# Patient Record
Sex: Female | Born: 1975
Health system: Southern US, Community
[De-identification: ages and names within clinical notes are randomized; demographics above are authoritative.]

## PROBLEM LIST (undated history)

## (undated) ENCOUNTER — Inpatient Hospital Stay (HOSPITAL_COMMUNITY): Payer: Self-pay

## (undated) DIAGNOSIS — F419 Anxiety disorder, unspecified: Secondary | ICD-10-CM

## (undated) DIAGNOSIS — F32A Depression, unspecified: Secondary | ICD-10-CM

## (undated) DIAGNOSIS — F329 Major depressive disorder, single episode, unspecified: Secondary | ICD-10-CM

## (undated) DIAGNOSIS — R51 Headache: Secondary | ICD-10-CM

## (undated) DIAGNOSIS — O26899 Other specified pregnancy related conditions, unspecified trimester: Secondary | ICD-10-CM

## (undated) DIAGNOSIS — H8090 Unspecified otosclerosis, unspecified ear: Secondary | ICD-10-CM

## (undated) DIAGNOSIS — N200 Calculus of kidney: Secondary | ICD-10-CM

## (undated) DIAGNOSIS — R519 Headache, unspecified: Secondary | ICD-10-CM

## (undated) DIAGNOSIS — H9190 Unspecified hearing loss, unspecified ear: Secondary | ICD-10-CM

## (undated) DIAGNOSIS — N809 Endometriosis, unspecified: Secondary | ICD-10-CM

## (undated) DIAGNOSIS — N159 Renal tubulo-interstitial disease, unspecified: Secondary | ICD-10-CM

## (undated) DIAGNOSIS — G935 Compression of brain: Secondary | ICD-10-CM

## (undated) HISTORY — DX: Headache, unspecified: R51.9

## (undated) HISTORY — DX: Calculus of kidney: N20.0

## (undated) HISTORY — DX: Other specified pregnancy related conditions, unspecified trimester: O26.899

## (undated) HISTORY — DX: Headache: R51

## (undated) HISTORY — DX: Compression of brain: G93.5

## (undated) HISTORY — DX: Unspecified hearing loss, unspecified ear: H91.90

## (undated) HISTORY — DX: Renal tubulo-interstitial disease, unspecified: N15.9

## (undated) HISTORY — DX: Anxiety disorder, unspecified: F41.9

---
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
## 1997-10-23 ENCOUNTER — Other Ambulatory Visit: Admission: RE | Admit: 1997-10-23 | Discharge: 1997-10-23 | Payer: Self-pay | Admitting: Obstetrics and Gynecology

## 1997-12-13 ENCOUNTER — Ambulatory Visit (HOSPITAL_COMMUNITY): Admission: RE | Admit: 1997-12-13 | Discharge: 1997-12-13 | Payer: Self-pay | Admitting: Obstetrics and Gynecology

## 1998-02-16 HISTORY — PX: SHOULDER SURGERY: SHX246

## 1998-03-26 ENCOUNTER — Other Ambulatory Visit: Admission: RE | Admit: 1998-03-26 | Discharge: 1998-03-26 | Payer: Self-pay | Admitting: Obstetrics and Gynecology

## 1998-04-22 ENCOUNTER — Other Ambulatory Visit: Admission: RE | Admit: 1998-04-22 | Discharge: 1998-04-22 | Payer: Self-pay | Admitting: Obstetrics and Gynecology

## 1998-12-31 ENCOUNTER — Other Ambulatory Visit: Admission: RE | Admit: 1998-12-31 | Discharge: 1998-12-31 | Payer: Self-pay | Admitting: Obstetrics and Gynecology

## 1999-09-04 ENCOUNTER — Other Ambulatory Visit: Admission: RE | Admit: 1999-09-04 | Discharge: 1999-09-04 | Payer: Self-pay | Admitting: Obstetrics and Gynecology

## 2000-10-15 ENCOUNTER — Other Ambulatory Visit: Admission: RE | Admit: 2000-10-15 | Discharge: 2000-10-15 | Payer: Self-pay | Admitting: Obstetrics and Gynecology

## 2001-11-30 ENCOUNTER — Emergency Department (HOSPITAL_COMMUNITY): Admission: EM | Admit: 2001-11-30 | Discharge: 2001-12-01 | Payer: Self-pay | Admitting: Emergency Medicine

## 2002-04-04 ENCOUNTER — Other Ambulatory Visit: Admission: RE | Admit: 2002-04-04 | Discharge: 2002-04-04 | Payer: Self-pay | Admitting: Obstetrics and Gynecology

## 2003-04-13 ENCOUNTER — Other Ambulatory Visit: Admission: RE | Admit: 2003-04-13 | Discharge: 2003-04-13 | Payer: Self-pay | Admitting: Obstetrics and Gynecology

## 2004-01-24 ENCOUNTER — Ambulatory Visit (HOSPITAL_COMMUNITY): Admission: RE | Admit: 2004-01-24 | Discharge: 2004-01-24 | Payer: Self-pay | Admitting: Obstetrics and Gynecology

## 2004-03-26 ENCOUNTER — Other Ambulatory Visit: Admission: RE | Admit: 2004-03-26 | Discharge: 2004-03-26 | Payer: Self-pay | Admitting: Obstetrics and Gynecology

## 2004-08-12 ENCOUNTER — Ambulatory Visit (HOSPITAL_COMMUNITY): Admission: RE | Admit: 2004-08-12 | Discharge: 2004-08-12 | Payer: Self-pay | Admitting: Obstetrics and Gynecology

## 2004-10-20 ENCOUNTER — Inpatient Hospital Stay (HOSPITAL_COMMUNITY): Admission: AD | Admit: 2004-10-20 | Discharge: 2004-10-24 | Payer: Self-pay | Admitting: Obstetrics and Gynecology

## 2004-12-02 ENCOUNTER — Other Ambulatory Visit: Admission: RE | Admit: 2004-12-02 | Discharge: 2004-12-02 | Payer: Self-pay | Admitting: Obstetrics and Gynecology

## 2004-12-11 ENCOUNTER — Emergency Department (HOSPITAL_COMMUNITY): Admission: EM | Admit: 2004-12-11 | Discharge: 2004-12-11 | Payer: Self-pay | Admitting: Emergency Medicine

## 2006-02-16 DIAGNOSIS — H8090 Unspecified otosclerosis, unspecified ear: Secondary | ICD-10-CM

## 2006-02-16 HISTORY — DX: Unspecified otosclerosis, unspecified ear: H80.90

## 2006-02-16 HISTORY — PX: WISDOM TOOTH EXTRACTION: SHX21

## 2006-04-19 ENCOUNTER — Ambulatory Visit (HOSPITAL_COMMUNITY): Admission: RE | Admit: 2006-04-19 | Discharge: 2006-04-19 | Payer: Self-pay | Admitting: Obstetrics and Gynecology

## 2006-10-28 ENCOUNTER — Ambulatory Visit (HOSPITAL_COMMUNITY): Admission: RE | Admit: 2006-10-28 | Discharge: 2006-10-28 | Payer: Self-pay | Admitting: Obstetrics and Gynecology

## 2007-07-23 ENCOUNTER — Inpatient Hospital Stay (HOSPITAL_COMMUNITY): Admission: AD | Admit: 2007-07-23 | Discharge: 2007-07-23 | Payer: Self-pay | Admitting: Obstetrics and Gynecology

## 2007-07-24 ENCOUNTER — Inpatient Hospital Stay (HOSPITAL_COMMUNITY): Admission: AD | Admit: 2007-07-24 | Discharge: 2007-07-24 | Payer: Self-pay | Admitting: Obstetrics and Gynecology

## 2007-08-23 ENCOUNTER — Inpatient Hospital Stay (HOSPITAL_COMMUNITY): Admission: AD | Admit: 2007-08-23 | Discharge: 2007-08-25 | Payer: Self-pay | Admitting: Family Medicine

## 2008-02-17 HISTORY — PX: TONSILLECTOMY AND ADENOIDECTOMY: SHX28

## 2008-03-14 ENCOUNTER — Encounter (INDEPENDENT_AMBULATORY_CARE_PROVIDER_SITE_OTHER): Payer: Self-pay | Admitting: Obstetrics and Gynecology

## 2008-03-14 ENCOUNTER — Ambulatory Visit (HOSPITAL_COMMUNITY): Admission: RE | Admit: 2008-03-14 | Discharge: 2008-03-14 | Payer: Self-pay | Admitting: Obstetrics and Gynecology

## 2008-04-26 ENCOUNTER — Ambulatory Visit (HOSPITAL_COMMUNITY): Admission: RE | Admit: 2008-04-26 | Discharge: 2008-04-26 | Payer: Self-pay | Admitting: Obstetrics and Gynecology

## 2008-05-11 ENCOUNTER — Encounter: Admission: RE | Admit: 2008-05-11 | Discharge: 2008-05-11 | Payer: Self-pay | Admitting: Obstetrics and Gynecology

## 2008-05-17 DIAGNOSIS — Z87448 Personal history of other diseases of urinary system: Secondary | ICD-10-CM | POA: Insufficient documentation

## 2008-05-17 DIAGNOSIS — D1803 Hemangioma of intra-abdominal structures: Secondary | ICD-10-CM | POA: Insufficient documentation

## 2008-05-17 DIAGNOSIS — N946 Dysmenorrhea, unspecified: Secondary | ICD-10-CM | POA: Insufficient documentation

## 2008-05-17 DIAGNOSIS — F329 Major depressive disorder, single episode, unspecified: Secondary | ICD-10-CM

## 2008-05-17 DIAGNOSIS — N809 Endometriosis, unspecified: Secondary | ICD-10-CM | POA: Insufficient documentation

## 2008-05-17 DIAGNOSIS — N920 Excessive and frequent menstruation with regular cycle: Secondary | ICD-10-CM | POA: Insufficient documentation

## 2008-05-17 DIAGNOSIS — M129 Arthropathy, unspecified: Secondary | ICD-10-CM | POA: Insufficient documentation

## 2008-05-17 DIAGNOSIS — R51 Headache: Secondary | ICD-10-CM

## 2008-05-22 ENCOUNTER — Ambulatory Visit: Payer: Self-pay | Admitting: Internal Medicine

## 2008-05-22 DIAGNOSIS — R1032 Left lower quadrant pain: Secondary | ICD-10-CM | POA: Insufficient documentation

## 2008-05-22 DIAGNOSIS — R932 Abnormal findings on diagnostic imaging of liver and biliary tract: Secondary | ICD-10-CM | POA: Insufficient documentation

## 2008-06-06 ENCOUNTER — Encounter: Payer: Self-pay | Admitting: Internal Medicine

## 2010-04-18 ENCOUNTER — Other Ambulatory Visit: Payer: Self-pay | Admitting: Otolaryngology

## 2010-04-18 ENCOUNTER — Ambulatory Visit (HOSPITAL_BASED_OUTPATIENT_CLINIC_OR_DEPARTMENT_OTHER)
Admission: RE | Admit: 2010-04-18 | Discharge: 2010-04-18 | Disposition: A | Discharge status: Home / Self Care | Payer: Managed Care, Other (non HMO) | Source: Ambulatory Visit | Attending: Otolaryngology | Admitting: Otolaryngology

## 2010-04-18 DIAGNOSIS — J3501 Chronic tonsillitis: Secondary | ICD-10-CM | POA: Insufficient documentation

## 2010-04-18 DIAGNOSIS — Z01812 Encounter for preprocedural laboratory examination: Secondary | ICD-10-CM | POA: Insufficient documentation

## 2010-04-18 LAB — POCT HEMOGLOBIN-HEMACUE: Hemoglobin: 15.5 g/dL — ABNORMAL HIGH (ref 12.0–15.0)

## 2010-05-09 NOTE — Op Note (Signed)
  NAMENASIYAH, LAVERDIERE          ACCOUNT NO.:  [id number]  MEDICAL RECORD NO.:  [id number]           PATIENT TYPE:  LOCATION:                                 FACILITY:  PHYSICIAN:  Christopher E. Ezzard Standing, M.D. DATE OF BIRTH:  DATE OF PROCEDURE:  04/18/2010 DATE OF DISCHARGE:                              OPERATIVE REPORT   PREOPERATIVE DIAGNOSIS:  Recurrent tonsillitis.  POSTOPERATIVE DIAGNOSIS:  Recurrent tonsillitis.  OPERATION:  Tonsillectomy.  SURGEON:  Kristine Garbe. Ezzard Standing, M.D.  ANESTHESIA:  General endotracheal.  COMPLICATIONS:  None.  BRIEF CLINICAL NOTE:  Robin Petrakis is a 35 year old female who has had history of recurrent tonsil infections.  She estimates 5 or 6 infections this past year.  Because of history of recurrent tonsil infections, she is taken to the operating room at this time for tonsillectomy.  She has just recently completed a course of Z-Pak for recurrent tonsil infection.  DESCRIPTION OF PROCEDURE:  After adequate endotracheal anesthesia, mouth gag was used to expose the oropharynx.  The left and right tonsils were then resected from tonsillar fossas using cautery.  Care was taken to preserve the anterior and posterior tonsillar pillars as well as the uvula.  Hemostasis was obtained with cautery.  After obtaining adequate hemostasis, the oropharynx was irrigated with saline.  Miku was awoken from anesthesia and transferred to recovery room, postop doing well.  DISPOSITION:  Angelisse is discharged home later this morning on amoxicillin suspension 5 mg b.i.d. for 1 week, Tylenol, and Lortab Elixir 1 to 1-1/2 tablespoons q.4 h. p.r.n. pain.  She will follow up office in 10 to 14 days for recheck.          ______________________________ Kristine Garbe. Ezzard Standing, M.D.     CEN/MEDQ  D:  04/18/2010  T:  04/18/2010  Job:  [id number]  Electronically Signed by Dillard Cannon M.D. on 05/09/2010 11:09:53 AM

## 2010-06-02 LAB — CBC
HCT: 41.9 % (ref 36.0–46.0)
Hemoglobin: 14.1 g/dL (ref 12.0–15.0)
MCHC: 33.6 g/dL (ref 30.0–36.0)
MCV: 91.7 fL (ref 78.0–100.0)
Platelets: 235 10*3/uL (ref 150–400)
RBC: 4.56 MIL/uL (ref 3.87–5.11)
RDW: 12.7 % (ref 11.5–15.5)
WBC: 7.2 10*3/uL (ref 4.0–10.5)

## 2010-06-02 LAB — COMPREHENSIVE METABOLIC PANEL
ALT: 11 U/L (ref 0–35)
AST: 18 U/L (ref 0–37)
Albumin: 4.1 g/dL (ref 3.5–5.2)
Alkaline Phosphatase: 62 U/L (ref 39–117)
BUN: 9 mg/dL (ref 6–23)
CO2: 28 mEq/L (ref 19–32)
Calcium: 9 mg/dL (ref 8.4–10.5)
Chloride: 106 mEq/L (ref 96–112)
Creatinine, Ser: 0.67 mg/dL (ref 0.4–1.2)
GFR calc Af Amer: >60 mL/min (ref >60)
GFR calc non Af Amer: >60 mL/min (ref >60)
Glucose, Bld: 90 mg/dL (ref 70–99)
Potassium: 3.9 mEq/L (ref 3.5–5.1)
Sodium: 138 mEq/L (ref 135–145)
Total Bilirubin: 0.5 mg/dL (ref 0.3–1.2)
Total Protein: 6.2 g/dL (ref 6.0–8.3)

## 2010-06-02 LAB — HCG, SERUM, QUALITATIVE: Preg, Serum: NEGATIVE

## 2010-07-01 NOTE — Op Note (Signed)
NAMERITAJ, DULLEA          ACCOUNT NO.:  [id number]   MEDICAL RECORD NO.:  [id number]          PATIENT TYPE:  AMB   LOCATION:  [facility]                           FACILITY:  [facility]   PHYSICIAN:  Guy Sandifer. Henderson Cloud, M.D. DATE OF BIRTH:  1975/05/07   DATE OF PROCEDURE:  DATE OF DISCHARGE:                               OPERATIVE REPORT   PREOPERATIVE DIAGNOSIS:  Menorrhagia and dysmenorrhea.   POSTOPERATIVE DIAGNOSIS:  Menorrhagia, endometriosis, right ovarian  cyst.   PROCEDURE:  Laparoscopy with resection and ablation of endometriosis,  aspiration of right ovarian cyst, hysteroscopy, dilatation and  curettage.   SURGEON:  Guy Sandifer. Henderson Cloud, MD   ANESTHESIA:  General with endotracheal intubation.   SPECIMENS:  Endometrial curettings, biopsy of left pelvis, biopsy of  posterior cul-de-sac, and aspirate of right ovarian cyst, all to  Pathology.   ESTIMATED BLOOD LOSS:  Minimal.   I and O's of distending media 40 mL deficit.   INDICATIONS AND CONSENT:  This patient is a 35 year old married white  female, G2, P2, with known endometriosis and increasingly painful heavy  irregular menses.  Details are dictated in the history and physical.  Laparoscopy, hysteroscopy, dilatation and curettage was discussed  preoperatively.  Potential risks and complications have been discussed  preoperatively including, but not limited to infection, organ damage,  bleeding requiring transfusion of blood products with HIV and hepatitis  acquisition, DVT, PE, pneumonia, recurrent pain, laparotomy.  All  questions are answered and consent is signed on the chart.   FINDINGS:  Upper abdomen is grossly normal.  Appendix is normal.  Uterus  is 6 weeks in size, smooth in contour.  Fallopian tubes are normal  bilaterally with luxuriant fimbria.  Left ovary is normal.  Right ovary  contains 3- to 4-cm smooth translucent cyst.  Right pelvic sidewall is  normal.  Left pelvic sidewall is normal.  There is an  adhesion with  endometriosis tenting the sigmoid up on the left pelvic brim.  The  anterior cul-de-sac contains 2 light brown implants of endometriosis on  the vesicouterine peritoneum just to the right of midline.  In the  posterior cul-de-sac, there are two 8-mm implants of dark blue  endometriosis high in the center of the cul-de-sac.  In the right side  of the posterior cul-de-sac, there is a cluster of similar lesions  approximately 1.5 to 2 cm in diameter.  The endometrial canal is normal.  Both fallopian tube ostia are visualized.   PROCEDURE:  The patient was taken to the operating room where she was  identified, placed in dorsal supine position.  General anesthesia was  induced via endotracheal intubation.  She was then placed in dorsal  lithotomy position where she was prepped abdominally and vaginally,  bladder straight catheterized, and she was draped in a sterile fashion.  Bivalve speculum was placed in the vagina, and the anterior cervical lip  was injected with 0.5% Marcaine.  Paracervical block was placed at 2, 4,  5, 7, 8, and 10 o'clock positions with approximately 15 mL total of the  same solution.  Cervix was gently and progressively dilated.  Diagnostic  hysteroscope was placed in the endocervical canal and advanced under  direct visualization using distending media.  The above findings were  noted.  Hysteroscope was withdrawn.  Sharp curettage was carried out.  Single-tooth tenaculum was then replaced with a Hulka tenaculum.  Attention was turned to the abdomen.  The infraumbilical and suprapubic  areas were injected in the midline with 0.5% plain Marcaine.  A small  infraumbilical incision was made, and a disposable Veress needle was  placed on the first attempt without difficulty.  A normal syringe and  drop test were noted.  Two liters of gas were then insufflated under low  pressure with good tympany in the right upper quadrant.  Veress needle  was removed.  A  10/11 Xcel bladeless disposable trocar sleeve was then  placed using direct visualization with the diagnostic laparoscopic.  After placement, the operative laparoscope was used.  A small suprapubic  incision was made in the midline, and a 5-mm Xcel bladeless disposable  trocar sleeve was placed under direct visualization without difficulty.  The above findings were noted.  The implants in the anterior cul-de-sac  were ablated with bipolar cautery.  The other implants were briefly  ablated with bipolar cautery as well.  This was done well clear of the  course of the ureters.  The implants on the left pelvic sidewall were  then resected which also releases the adhesions of the sigmoid.  Cautery  was obtained with superficial bipolar cautery.  The implants in the  posterior cul-de-sac were also sharply resected in a superficial manner.  Hemostasis was obtained with bipolar cautery.  Copious irrigation was  carried out.  The right ovarian cyst was then aspirated for straw-  colored serous fluid which was sent for cytology as well.  Good  hemostasis was noted on the ovary.  Interceed was then back-loaded  through the laparoscope and placed over the points of cautery on the  left pelvic sidewall as well as the posterior cul-de-sac.  The remaining  9 mL of 0.5% plain Marcaine was then instilled into the peritoneal  cavity for a total of 30 mL being used for the entire case.  Suprapubic  trocar sleeve was removed, pneumoperitoneum was reduced, and the  umbilical trocar sleeve was removed.  The umbilical incision was closed  with a 0 Vicryl suture in the subcutaneous tissue with good  visualization.  Subcuticular 3-0 Vicryl was then used to close both  incisions, and Dermabond was placed on both incisions as well.  Hulka  tenaculum was removed and no bleeding was noted.  All counts were  correct.  The patient was awakened and taken to recovery room in stable  condition.      Guy Sandifer Henderson Cloud,  M.D.  Electronically Signed     JET/MEDQ  D:  03/14/2008  T:  03/14/2008  Job:  [id number]

## 2010-07-01 NOTE — H&P (Signed)
Ellen Vasquez, CLONINGER          ACCOUNT NO.:  [id number]   MEDICAL RECORD NO.:  [id number]          PATIENT TYPE:  AMB   LOCATION:  [facility]                           FACILITY:  [facility]   PHYSICIAN:  Guy Sandifer. Henderson Cloud, M.D. DATE OF BIRTH:  [birth year]   DATE OF ADMISSION:  DATE OF DISCHARGE:                              HISTORY & PHYSICAL   CHIEF COMPLAINT:  Pelvic pain and heavy menses.   HISTORY OF PRESENT ILLNESS:  This patient is a 35 year old married white  female G2, P2 with known endometriosis who has had increasingly severe  premenstrual pain.  Menses have also been quite heavy and difficult to  control with multiple hormonal medications.  Ultrasound on March 05, 2008, revealed the uterus measuring 10.5 x 3.8 x 5.3 cm.  The right  ovary contained a 2.9-cm simple cyst.  The endometrial stripe was than  measuring 0.09 cm.  After discussion of the options, she is being  admitted for laparoscopy with hysteroscopy, dilatation and curettage.  Potential risks and complications have been discussed preoperatively.   PAST MEDICAL HISTORY:  1. Depression.  2. Headaches.  3. Abnormal Pap smears.  4. Arthritis.  5. History of UTIs.  6. Endometriosis.   PAST SURGICAL HISTORY:  Laparoscopy with hysteroscopy, D&C in 1999,  laparoscopy in 2008.   OBSTETRICAL HISTORY:  Vaginal delivery x2.   FAMILY HISTORY:  Positive for asthma, UTI, osteoporosis, arthritis,  uterine cancer, and skin cancer.   MEDICATIONS:  1. Prozac 10 mg a day.  2. Percocet p.r.n. pain.   ALLERGIES:  SHELLFISH.   SOCIAL HISTORY:  Denies tobacco, alcohol, or drug abuse.   REVIEW OF SYSTEMS:  NEUROLOGIC:  Has occasional headaches and dizziness.  CARDIAC:  Denies chest pain.  PULMONARY:  Denies shortness of breath.   PHYSICAL EXAMINATION:  VITAL SIGNS:  Height 5 feet 10 inches, weight 156  pounds, and blood pressure 118/78.  LUNGS:  Clear to auscultation.  HEART:  Regular rate and rhythm.  ABDOMEN:  Soft and  nontender without masses.  PELVIC:  Bulge and cervix without lesion.  Uterus anteverted, normal  size, nontender.  Adnexa nontender without masses.  EXTREMITIES:  Grossly within normal limits.  NEUROLOGICAL:  Grossly within normal limits.   ASSESSMENT:  Dysmenorrhea and menorrhagia.   PLAN:  Laparoscopy, hysteroscopy, D&C.      Guy Sandifer Henderson Cloud, M.D.  Electronically Signed     JET/MEDQ  D:  03/12/2008  T:  03/13/2008  Job:  [id number]

## 2010-07-04 NOTE — H&P (Signed)
NAME:  Ellen Vasquez, Ellen Vasquez          ACCOUNT NO.:  [id number]   MEDICAL RECORD NO.:  [id number]          PATIENT TYPE:  AMB   LOCATION:  [facility]                           [facility]ACILITY:  [facility]   PHYSICIAN:  Guy Sandifer. Henderson Cloud, M.D. DATE OF BIRTH:  [birth year]   DATE OF ADMISSION:  04/19/2006  DATE OF DISCHARGE:                              HISTORY & PHYSICAL   CHIEF COMPLAINT:  Pelvic pain.   HISTORY OF PRESENT ILLNESS:  This patient is a 35 year old married white  female, G 1, P 1, with a known history of endometriosis.  The pain is  becoming progressively more severe.  After a discussion of the options,  she is being admitted for a laparoscopy.  The potential risks of surgery  have been reviewed with the patient preoperatively.  An ultrasound in my  office on April 16, 2006, revealed normal ovaries and a normal  uterus.   PAST MEDICAL HISTORY:  1. Depression.  2. Endometriosis.   PAST SURGICAL HISTORY:  Laparoscopy in 1999.   OBSTETRIC HISTORY:  A delivery x1.   MEDICAL DECISION MAKING:  1. Prozac daily.  2. Birth control pill daily.   ALLERGIES:  PHENERGAN AND CODEINE.   FAMILY HISTORY:  Heart disease in the paternal grandfather.  Chronic  hypertension in the mother and paternal grandfather. Diabetes in a great  uncle.  Stroke in maternal grandmother.  Skin cancer in maternal  grandmother and paternal grandmother.  Prostate cancer in a great uncle.   SOCIAL HISTORY:  Denies tobacco, alcohol or drug abuse.   REVIEW OF SYSTEMS:  NEUROLOGIC:  Denies headache.  CARDIOVASCULAR:  Denies chest pain.  PULMONARY:  Denies shortness of breath.  GI:  Denies  recent changes in bowel habits.   PHYSICAL EXAMINATION:  VITAL SIGNS:  Height 5 feet 10 inches, weight  148.8 pounds, blood pressure 118/70.  HEENT:  Without thyromegaly.  LUNGS:  Clear to auscultation.  HEART:  A regular rate and rhythm.  BACK:  Without CVA tenderness.  BREASTS:  Without masses, retraction or discharge.  ABDOMEN:   Soft, nontender, without masses.  PELVIC:  Vulva, vagina and cervix without lesions.  Uterus normal size,  mobile, nontender.  Adnexa nontender without masses.  EXTREMITIES:  Grossly within normal limits.  NEUROLOGIC:  Grossly within normal limits.   ASSESSMENT:  History of endometriosis with recurrent dysmenorrhea.   PLAN:  Laparoscopy.      Guy Sandifer Henderson Cloud, M.D.  Electronically Signed     JET/MEDQ  D:  04/16/2006  T:  04/16/2006  Job:  [id number]

## 2010-07-04 NOTE — Op Note (Signed)
NAME:  Ellen Vasquez, Ellen Vasquez          ACCOUNT NO.:  [id number]   MEDICAL RECORD NO.:  [id number]          PATIENT TYPE:  AMB   LOCATION:  [facility]                           [facility]ILITY:  [facility]   PHYSICIAN:  Guy Sandifer. Henderson Cloud, M.D. DATE OF BIRTH:  1975/06/04   DATE OF PROCEDURE:  04/19/2006  DATE OF DISCHARGE:                               OPERATIVE REPORT   PREOPERATIVE DIAGNOSIS:  Dysmenorrhea.   POSTOPERATIVE DIAGNOSIS:  Endometriosis.   PROCEDURE:  Laparoscopy with ablation of endometriosis.   SURGEON:  Harold Hedge, M.D.   ANESTHESIA:  General with endotracheal intubation.   ESTIMATED BLOOD LOSS:  Drops.   INDICATIONS AND CONSENT:  The patient is a 35 year old married white  female G1, P1 with known endometriosis.  She has worsening dysmenorrhea.  Details are dictated in the history and physical.  Laparoscopy is  discussed with the patient preoperatively.  Potential risks and  complications have been discussed preoperatively including but not  limited to infection; organ damage with bowel, bladder, ureteral damage;  bleeding requiring transfusion of blood products with possible  transfusion reaction, HIV and hepatitis acquisition; DVT; PE; pneumonia.  All questions have been answered and consent is signed on the chart.   FINDINGS:  Upper abdomen is grossly normal.  Appendix is normal.  Uterus  is normal in size and contour.  Anterior cul-de-sacs and bilateral  pelvic sidewalls were normal.  Ovaries were normal.  Fallopian tubes  were normal bilaterally with delicate fimbria.  Posterior cul-de-sac  contains 3- to 4-mm red implants of endometriosis in the center.   PROCEDURE:  The patient was taken to operating room where she was  identified, placed in dorsal supine position and general anesthesia was  induced with endotracheal intubation.  She was then placed in dorsal  lithotomy position, prepped abdominally and vaginally bladder.  Bladder  was straight catheterized, and a Hulka  tenaculum was placed in the  uterus as a manipulator.  She was then draped in a sterile fashion.  The  infraumbilical and suprapubic areas were injected in the midline with  1/2% plain Marcaine.  A small infraumbilical incision was made, and a  disposable Veress needle was placed.  Two liters of gas were insufflated  under low pressure with good tympany in the right upper quadrant.  A  Veress needle was removed, and a 10/11 XL bladeless disposable trocar  sleeve was placed under direct visualization without difficulty.  This  was then switched to the operative laparoscopic.  A small suprapubic  incision was made at the midline, and a 5-mm bladeless XL disposable  trocar sleeve was placed under direct visualization without difficulty.  The above findings were noted.  Irrigation was carried out.  The areas  in the posterior cul-de-sac of endometriosis are ablated with bipolar  cautery.  Irrigation is again carried out.  Suprapubic trocar sleeve was  removed.  No bleeding was noted.  Pneumoperitoneum was reduced, and the  umbilical trocar  sleeve was removed.  The skin of the umbilical incision was closed with  3-0 Vicryl stitches in the skin.  Both incisions were closed with  Dermabond as well.  Hulka tenaculum was removed, and good hemostasis was  noted.  All counts were correct.  The patient was awakened and taken to  recovery room in stable condition.      Guy Sandifer Henderson Cloud, M.D.  Electronically Signed     JET/MEDQ  D:  04/19/2006  T:  04/19/2006  Job:  [id number]

## 2010-11-13 LAB — CBC
HCT: 34.8 — ABNORMAL LOW
HCT: 38.8
Hemoglobin: 11.9 — ABNORMAL LOW
Hemoglobin: 13.4
MCHC: 34.2
MCHC: 34.5
MCV: 91.8
MCV: 92.7
Platelets: 174
Platelets: 200
RBC: 3.75 — ABNORMAL LOW
RBC: 4.23
RDW: 13.1
RDW: 13.1
WBC: 13.7 — ABNORMAL HIGH
WBC: 14.9 — ABNORMAL HIGH

## 2010-11-13 LAB — URINALYSIS, ROUTINE W REFLEX MICROSCOPIC
Bilirubin Urine: NEGATIVE
Glucose, UA: NEGATIVE
Hgb urine dipstick: NEGATIVE
Ketones, ur: NEGATIVE
Nitrite: NEGATIVE
Protein, ur: NEGATIVE
Specific Gravity, Urine: <1.005 — ABNORMAL LOW
Urobilinogen, UA: 0.2
pH: 6.5

## 2010-11-13 LAB — RPR: RPR Ser Ql: NONREACTIVE

## 2011-01-01 ENCOUNTER — Other Ambulatory Visit (HOSPITAL_COMMUNITY): Payer: Self-pay | Admitting: Obstetrics and Gynecology

## 2011-01-01 DIAGNOSIS — N979 Female infertility, unspecified: Secondary | ICD-10-CM

## 2011-01-05 ENCOUNTER — Ambulatory Visit (HOSPITAL_COMMUNITY)
Admission: RE | Admit: 2011-01-05 | Discharge: 2011-01-05 | Disposition: A | Discharge status: Home / Self Care | Payer: Managed Care, Other (non HMO) | Source: Ambulatory Visit | Attending: Obstetrics and Gynecology | Admitting: Obstetrics and Gynecology

## 2011-01-05 DIAGNOSIS — N979 Female infertility, unspecified: Secondary | ICD-10-CM

## 2011-01-05 MED ORDER — IOHEXOL 300 MG/ML  SOLN: 4.0000 mL | Freq: Once | INTRAMUSCULAR | Status: AC | PRN

## 2012-11-30 ENCOUNTER — Encounter: Payer: Self-pay | Admitting: Podiatry

## 2012-11-30 ENCOUNTER — Ambulatory Visit (INDEPENDENT_AMBULATORY_CARE_PROVIDER_SITE_OTHER): Payer: BC Managed Care – PPO | Admitting: Podiatry

## 2012-11-30 VITALS — BP 135/98 | HR 87 | Resp 12

## 2012-11-30 DIAGNOSIS — M779 Enthesopathy, unspecified: Secondary | ICD-10-CM

## 2012-11-30 DIAGNOSIS — M216X9 Other acquired deformities of unspecified foot: Secondary | ICD-10-CM

## 2012-11-30 NOTE — Patient Instructions (Signed)
We will call when orthotics are returned

## 2012-11-30 NOTE — Progress Notes (Signed)
Subjective:     Patient ID: Ellen Vasquez, female   DOB: 01/10/1976, 37 y.o.   MRN: [id number]  Foot Pain   patient states that she is still having pain but is improving. She is able to walk but feels like she is still walking on a pebble   Review of Systems  All other systems reviewed and are negative.       Objective:   Physical Exam  Constitutional: She appears well-developed.  Cardiovascular: Intact distal pulses.   Musculoskeletal: Normal range of motion.  Neurological: She is alert.  Skin: Skin is warm.   Patient continues to experience plantar pain in the fourth metatarsophalangeal joint bilateral. She did get 2 days of relief from the previous injection last week    Assessment:     Capsulitis fourth MPJ bilateral. Most likely structural in orientation    Plan:    recommended orthotics scanning which was done today to try to take stress off the joints. Discussed that ultimately this may be surgery to shorten the metatarsal

## 2013-01-04 ENCOUNTER — Encounter: Payer: Self-pay | Admitting: *Deleted

## 2013-01-04 ENCOUNTER — Encounter: Payer: BC Managed Care – PPO | Attending: Family Medicine | Admitting: *Deleted

## 2013-01-04 DIAGNOSIS — R638 Other symptoms and signs concerning food and fluid intake: Secondary | ICD-10-CM | POA: Insufficient documentation

## 2013-01-04 DIAGNOSIS — E162 Hypoglycemia, unspecified: Secondary | ICD-10-CM | POA: Insufficient documentation

## 2013-01-04 DIAGNOSIS — R635 Abnormal weight gain: Secondary | ICD-10-CM | POA: Insufficient documentation

## 2013-01-04 NOTE — Patient Instructions (Addendum)
Make appointment with Dr. Elvera Lennox [phone].  [address]  [address] Monitor blood sugar when feeling low.  Record value IF glucose if less than 70, that is true hypoglycemia: Treat with 15g carbohydrate = 4 oz juice, 4 tea, 8 oz skim; 4 glucose tablets or 4 peppermints. Try not to use foods that have fat or protein. Wait 15 minute to give glucose time to raise back up and then have something with protein and carbs.   Aim for protein and fiber with all eating occasions For snacks choose greek yogurt OR fruit and nuts or nut butter or cheese Keep glucose tabs with you at all times.

## 2013-01-04 NOTE — Progress Notes (Signed)
Medical Nutrition Therapy:  Appt start time: 1030 end time:  1130.  Assessment:  Primary concerns today: Ellen Vasquez is here for nutrition counseling pertaining to hypoglycemia.  She had recent blood work done and that revealed hypoglycemia.  [name] states that this is not a new issue and she has been to endocrinologists multiple time.  She states that she hasn't been able to identify the issue. She has been dealing with hypoglycemia since childhood, but it has gotten worse.  She doesn't check her glucose at home, but feels dizziness, shakiness, at least once a day.    Gained 20 pounds this year for unknown reasons.  She exercises regularly.  She has 2 children.  She doesn't drink soda or fast foods.  She isn't a big meat eater.  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  She struggles with theses hypoglycemia symptoms and feels like she has to eat a great deal to get her glucose back up.  She uses protein bars for treatment.  She also uses peanut butter, bananas, yogurt.  She feels like her most trouble is between 10-2.  She eats Special K protein or oatmeal with protein and eats larger portions.  She has to eat again greek yogurt and banana about 2 hours late.  She eats again around noon: carbs, veggies (salad), protein, preferably vegetable-based protein, but will eat children.  She doesn't like fish and uses ground [place] instead of beef.  Very rarely eats beef.  Her iron levels are normal.  She plans to start fertility treatment soon. The last time she has been to an endocrinologist 15 years ago.  She cooks heart-healthy at home because husband has high cholesterol.  She cooks with [employer] products.  She does look at fiber in her food products. Exercise affects her blood sugar in the mornings only.  If she exercises at night, her glucose is fine  Preferred Learning Style: Auditory  Learning Readiness: Ready   MEDICATIONS: see list   DIETARY INTAKE: Usual eating pattern includes  3 meals and 2 snacks per day.  Everyday foods include vegetables, lean proteins, complex carbs (whole grains) .  Avoided foods include pork, fried foods, beef, fish, shellfish, potatoes in moderation, candy, junk foods, limits sweets and desserts.    24-hr recall:  B ( AM): high protein cereal with 1% milk.  Sometimes OJ and hot tea with sugar and milk  Snk ( AM): greek yogurt and banana or apple  L ( PM): salad or [place] sandwich.  Sometimes pasta Snk ( PM): fruit; oatmeal to go; yogurt covered raisins or peanuts.  Wheat thins.  Maybe greek yogurt D ( PM): meat, starch, vegetables- baked breaded eggplant with corn/peas and broccoli and a bean and egg noodles Snk ( PM): usually not.  Maybe cup of tea.  Sometimes wheat thins when taking medication Beverages: hot tea, OJ, water, iced tea.  No soda  Usual physical activity: 1 hour 4-6 day/week.  Cardio mostly.  Not much weight training  Estimated energy needs: 1800 calories 200 g carbohydrates 135 g protein 50 g fat    Nutritional Diagnosis:  [place]-2.1 Inpaired nutrition utilization As related to carbohydrates.  As evidenced by frequent episodes of hypoglycemia.    Intervention:  Nutrition counseling provided.  Referred to Dr. Elvera Lennox for insulin check and/or pancreatic tumor.  Possible benefit for Diazoxide prescription.  Sylvi felt like she was getting hypoglycemic today in session.  We checked her blood glucose and it was 91mg /dL  Recommendations:  Monitor blood sugar when feeling low.  Record value IF glucose if less than 70, that is true hypoglycemia: Treat with 15g carbohydrate = 4 oz juice, 4 tea, 8 oz skim; 4 glucose tablets or 4 peppermints. Try not to use foods that have fat or protein. Wait 15 minute to give glucose time to raise back up and then have something with protein and carbs.   Aim for protein and fiber with all eating occasions For snacks choose greek yogurt OR fruit and nuts or nut butter or cheese Keep glucose  tabs with you at all times.  Teaching Method Utilized: Auditory  Blood glucose monitoring kit given: [employer] Next Lot # [id number] Exp: 4/15 Glucose reading :91 mg/dL  Barriers to learning/adherence to lifestyle change: none  Demonstrated degree of understanding via:  Teach Back   Monitoring/Evaluation:  Dietary intake, exercise, lab results, and body weight prn.

## 2013-01-09 ENCOUNTER — Other Ambulatory Visit (HOSPITAL_COMMUNITY): Payer: Self-pay | Admitting: Obstetrics and Gynecology

## 2013-01-09 DIAGNOSIS — Z3141 Encounter for fertility testing: Secondary | ICD-10-CM

## 2013-01-11 ENCOUNTER — Ambulatory Visit (INDEPENDENT_AMBULATORY_CARE_PROVIDER_SITE_OTHER): Payer: BC Managed Care – PPO | Admitting: Podiatry

## 2013-01-11 ENCOUNTER — Encounter: Payer: Self-pay | Admitting: Podiatry

## 2013-01-11 VITALS — BP 113/72 | HR 92 | Resp 12

## 2013-01-11 DIAGNOSIS — M216X9 Other acquired deformities of unspecified foot: Secondary | ICD-10-CM

## 2013-01-11 DIAGNOSIS — M779 Enthesopathy, unspecified: Secondary | ICD-10-CM

## 2013-01-11 NOTE — Patient Instructions (Signed)

## 2013-01-11 NOTE — Progress Notes (Signed)
Subjective:     Patient ID: Ellen Vasquez, female   DOB: 10-30-1975, 37 y.o.   MRN: [id number]  HPI patient states that I am having continued discomfort in these joints. It is most pronounced after periods of activity or when on my feet a lot   Review of Systems     Objective:   Physical Exam Neurovascular status intact with continued discomfort in the fourth MPJ is of both feet    Assessment:     Chronic capsulitis fourth MPJ bilateral that has failed so far to respond to conservative care    Plan:     Today orthotics were dispensed with instructions on usage. I discussed that if symptoms were to continue working in need to consider shortening osteotomies at one point in the future. Reappoint in 6 weeks

## 2013-01-18 ENCOUNTER — Ambulatory Visit (HOSPITAL_COMMUNITY)
Admission: RE | Admit: 2013-01-18 | Discharge: 2013-01-18 | Disposition: A | Discharge status: Home / Self Care | Payer: BC Managed Care – PPO | Source: Ambulatory Visit | Attending: Obstetrics and Gynecology | Admitting: Obstetrics and Gynecology

## 2013-01-18 DIAGNOSIS — Z3141 Encounter for fertility testing: Secondary | ICD-10-CM

## 2013-01-18 DIAGNOSIS — N979 Female infertility, unspecified: Secondary | ICD-10-CM | POA: Insufficient documentation

## 2013-01-18 MED ORDER — IOHEXOL 300 MG/ML  SOLN
20.0000 mL | Freq: Once | INTRAMUSCULAR | Status: AC | PRN
  Administered 2013-01-18: 20 mL

## 2013-02-10 ENCOUNTER — Encounter: Payer: Self-pay | Admitting: Internal Medicine

## 2013-02-10 ENCOUNTER — Ambulatory Visit (INDEPENDENT_AMBULATORY_CARE_PROVIDER_SITE_OTHER): Payer: BC Managed Care – PPO | Admitting: Internal Medicine

## 2013-02-10 VITALS — BP 118/84 | HR 70 | Temp 98.0°F | Ht 70.0 in | Wt 162.0 lb

## 2013-02-10 DIAGNOSIS — E162 Hypoglycemia, unspecified: Secondary | ICD-10-CM

## 2013-02-10 LAB — HEMOGLOBIN A1C: Hgb A1c MFr Bld: 5.1 % (ref 4.6–6.5)

## 2013-02-10 NOTE — Patient Instructions (Addendum)
Try to follow a low glycemic index diet, mostly mediterranean. Try Ezekiel Bread.  [email or web address] Please continue to see Ellen Vasquez for further dietary adjustment.  Please go to the nearest lab (have somebody drive) if sugars are 50 or below. Take the lab orders with you.

## 2013-02-10 NOTE — Progress Notes (Signed)
Subjective:     Patient ID: Ellen Vasquez, female   DOB: 1976-01-04, 37 y.o.   MRN: [id number]  HPI Ellen Vasquez is a pleasant 37 y.o. woman, referred by her nutritionist, Denny Levy, for evaluation for hypoglycemia.  She has had hypoglycemic sxs ever since she was a teenager: - tremors - palpitations - sensation of passing out Lowest CBG reading: 67 - she started to check sugars recently, after getting a meter from nutritionist. No fasting hypoglycemia but starts to feel hypoglycemic 1.5-2.5 hours after she eats. She tries to eat many times a day to keep up with this.   She gained weight 25 lbs in last year, despite good diet and exercising regularly. She was on Nortriptyline >> stopped 1 mo ago >> no weight loss.  Meals are mostly healthy, she does not overeat, and a follow a DASH diet at home (husband with hyperlipidemia). Her meals are: - Breakfast: Special K. Protein serial, hot tea - at 7:30 AM - Snacks: Yogurt or fruit - at 10 AM - lunch: Sandwhich, soup/salads, pasta with veggies - dinner: veggies, starch, some type of protein - other status: To before a day: Yogurt, fruit, nuts, cheese, crackers  She exercises by cardio 4-5 times a week.  FH of morbid obesity in mother. No DM FH.   Review of Systems Constitutional: + weight gain, + fatigue, +subjective hyperthermia (hot flashes) Eyes: no blurry vision, no xerophthalmia ENT: no sore throat, no nodules palpated in throat, no dysphagia/odynophagia, no hoarseness, + hypoacusis, + tinnitus Cardiovascular: no CP/SOB/palpitations/+ leg swelling Respiratory: no cough/SOB Gastrointestinal: no N/V/D/C Musculoskeletal:+ both: muscle/joint aches Skin: + rash, + excessive hair growth Neurological: + tremors - when feels that sugars drop/no numbness/tingling/dizziness, + headache Psychiatric: + depression/+ anxiety Regular menstrual cycles    Objective:   Physical Exam BP 118/84  Pulse 70  Temp(Src) 98 F (36.7 C)  (Oral)  Ht 5\' 10"  (1.778 m)  Wt 162 lb (73.483 kg)  BMI 23.24 kg/m2  SpO2 99%  LMP 01/08/2013 Wt Readings from Last 3 Encounters:  02/10/13 162 lb (73.483 kg)  05/22/08 149 lb 8 oz (67.813 kg)   Constitutional: normal weight, in NAD Eyes: PERRLA, EOMI, no exophthalmos ENT: moist mucous membranes, no thyromegaly, no cervical lymphadenopathy Cardiovascular: RRR, No MRG Respiratory: CTA B Gastrointestinal: abdomen soft, NT, ND, BS+ Musculoskeletal: no deformities, strength intact in all 4 Skin: moist, warm, no rashes Neurological: no tremor with outstretched hands, DTR normal in all 4  PMH: - Kidney stones - anemia - migraines - endometriosis - otosclerosis  Past Surgical History  Procedure Laterality Date  . Abdominal hysterectomy    . Wisdom tooth extraction    . Shoulder surgery     History   Social History  . Marital Status: Married    Spouse Name: N/A    Number of Children: 2: 8 and 5 y/o   Occupational History  . Professor   Social History Main Topics  . Smoking status: Never Smoker   . Smokeless tobacco: No  . Alcohol Use: Wine, 2x a week  . Drug Use: No   Current Outpatient Prescriptions on File Prior to Visit  Medication Sig Dispense Refill  . Azelaic Acid (FINACEA) 15 % cream Apply topically 2 (two) times daily. After skin is thoroughly washed and patted dry, gently but thoroughly massage a thin film of azelaic acid cream into the affected area twice daily, in the morning and evening.      . Prenatal Vit-Fe Fumarate-FA (PRENATAL  MULTIVITAMIN) TABS tablet Take 1 tablet by mouth daily at 12 noon.      . nortriptyline (PAMELOR) 10 MG capsule Take 10 mg by mouth at bedtime.       No current facility-administered medications on file prior to visit.   Allergies  Allergen Reactions  . Shellfish Allergy    Family History  Problem Relation Age of Onset  . Asthma Other   . Cancer Other   . Hypertension Other   . Hyperlipidemia Other   . Stroke Other    . Obesity Other     Assessment:     Mild reactive hypoglycemia - without actual low CBGs    Plan:     Patient with apparently long history of reactive (postprandial) very mild hypoglycemia, with CBG levels out of proportion compared with her symptoms. She has a sensation that she would pass out, and also has anxiety, tremors, occasional headaches if she doesn't eat frequently. She had similar symptoms in the office today. CBG 104.  - I had a long discussion with the patient about what reactive hypoglycemia means, and the fact that in patients without diabetes, the CBG threshold of 70 does not apply. Therefore, a sugar in the 60s is perfectly normal, and it can be even lower than that especially in young women and athletes. We discussed about what the notion of "sugar crash" means and that it can be a normal reaction to a high glycemic index food. In pts with insulin resistance, for example prediabetic pts, there can be a mismatch between the sugar increase after a meal and pancreatic insulin production. Insulin is secreted in the circulation to late and in too large quantities >> hypoglycemia. I believe she has something similar, but not clear hypoglycemia as there are no clearly low sugars. - we discussed at length about diet (including examples) and the importance of avoiding high Glycemic Index foods - given web link for reference. I advised her to continue to work with Vernona Rieger to improve her diet to avoid reactive hypoglycemia - I also advised her to go to the nearest lab (have somebody drive her) if sugars are 50 or below and I gave her lab orders to be done if her sugars are low: Orders Placed This Encounter  Procedures  . C-peptide  . Basic metabolic panel  . Insulin, random  . Proinsulin  . Beta-hydroxybutyric acid  - we checked a Hba1c today. - will schedule a return appt only if the below labs are abnormal  Office Visit on 02/10/2013  Component Date Value Range Status  .  Hemoglobin A1C 02/10/2013 5.1  4.6 - 6.5 % Final   Glycemic Control Guidelines for People with Diabetes:Non Diabetic:  <6%Goal of Therapy: <7%Additional Action Suggested:  >8%

## 2013-02-22 ENCOUNTER — Ambulatory Visit: Payer: BC Managed Care – PPO | Admitting: Podiatry

## 2013-03-08 ENCOUNTER — Ambulatory Visit (INDEPENDENT_AMBULATORY_CARE_PROVIDER_SITE_OTHER): Payer: BC Managed Care – PPO | Admitting: Podiatry

## 2013-03-08 ENCOUNTER — Encounter: Payer: Self-pay | Admitting: Podiatry

## 2013-03-08 VITALS — BP 110/71 | HR 74 | Resp 16

## 2013-03-08 DIAGNOSIS — M216X9 Other acquired deformities of unspecified foot: Secondary | ICD-10-CM

## 2013-03-08 DIAGNOSIS — M779 Enthesopathy, unspecified: Secondary | ICD-10-CM

## 2013-03-08 NOTE — Progress Notes (Signed)
Subjective:     Patient ID: Ellen Vasquez, female   DOB: [birth year], 38 y.o.   MRN: [id number]  HPI patient presents stating that she still is getting the pain. States the orthotics have helped some but it does cause some pain around the first metatarsal but she forgot to bring them today. States that she cannot exercise with this and admits that she is not making the progress she was hoping   Review of Systems     Objective:   Physical Exam Neurovascular status intact with intense discomfort which continues to present in the fourth metatarsal allergy we'll joint of both feet with what appears to be diminished motion of this joint and inflammation present    Assessment:     Pathology of the fourth MPJ bilateral which has failed to respond to previous injection shoe gear modifications padding and orthotics    Plan:     Reviewed all findings with her and we did dispense further padding for her to wear her today. We discussed ultimate shortening osteotomy surgery the fourth metatarsals and she wants to do this but she is undergoing infertility treatments so at this time we are going to hold off until we know the status of that. I will see her prior to surgery

## 2013-06-16 HISTORY — PX: LAPAROSCOPIC ABDOMINAL EXPLORATION: SHX6249

## 2013-06-16 HISTORY — PX: DILATION AND CURETTAGE OF UTERUS: SHX78

## 2013-06-19 ENCOUNTER — Other Ambulatory Visit: Payer: Self-pay | Admitting: Obstetrics and Gynecology

## 2013-07-26 ENCOUNTER — Ambulatory Visit (INDEPENDENT_AMBULATORY_CARE_PROVIDER_SITE_OTHER): Payer: BC Managed Care – PPO | Admitting: Surgery

## 2013-07-26 ENCOUNTER — Telehealth (INDEPENDENT_AMBULATORY_CARE_PROVIDER_SITE_OTHER): Payer: Self-pay | Admitting: *Deleted

## 2013-07-26 ENCOUNTER — Encounter (INDEPENDENT_AMBULATORY_CARE_PROVIDER_SITE_OTHER): Payer: Self-pay | Admitting: Surgery

## 2013-07-26 VITALS — BP 122/74 | HR 72 | Temp 97.5°F | Ht 68.0 in | Wt 154.0 lb

## 2013-07-26 DIAGNOSIS — D7389 Other diseases of spleen: Secondary | ICD-10-CM

## 2013-07-26 DIAGNOSIS — D1803 Hemangioma of intra-abdominal structures: Secondary | ICD-10-CM

## 2013-07-26 DIAGNOSIS — D734 Cyst of spleen: Secondary | ICD-10-CM | POA: Insufficient documentation

## 2013-07-26 NOTE — Progress Notes (Signed)
General Surgery [facility]  Chief Complaint  Patient presents with  . New Evaluation    evaluate cyst in LUQ abdomen seen at laparoscopy - referral from Dr. Everlene Farrier    HISTORY: Patient is a pleasant 38 year old female who presents today accompanied by her husband for evaluation of a cystic mass identified at the time of laparoscopy on 06/19/2013. Apparently the patient was undergoing laparoscopy for ablation of endometriosis. A cystic lesion was noted in the left upper quadrant of the abdomen. Photographs were taken but are not available today for evaluation. Patient was referred to general surgery for further assessment and recommendations.  Patient does have old studies dating from 2006 and 2010 including CT scans of the abdomen and pelvis and an MRI scan of the abdomen. At that time a 14 mm hemangioma was noted in the right lobe of the liver. Small cyst were noted in the liver. Small cysts were noted bilaterally in the kidneys. No abnormality of the spleen was identified.  Patient denies any history of trauma. She has had no other abdominal surgery with the exception of laparoscopy. She does have intermittent left upper quadrant sharp pain which is brief in nature. She has had a history of kidney stones.  History reviewed. No pertinent past medical history.  No current outpatient prescriptions on file.   No current facility-administered medications for this visit.    Allergies  Allergen Reactions  . Shellfish Allergy     Family History  Problem Relation Age of Onset  . Asthma Other   . Cancer Other   . Hypertension Other   . Hyperlipidemia Other   . Stroke Other   . Obesity Other     History   Social History  . Marital Status: Married    Spouse Name: N/A    Number of Children: N/A  . Years of Education: N/A   Social History Main Topics  . Smoking status: Former Smoker    Types: Cigarettes    Quit date: 11/25/2001  . Smokeless tobacco:  None  . Alcohol Use: Yes  . Drug Use: No  . Sexual Activity: None   Other Topics Concern  . None   Social History Narrative  . None    REVIEW OF SYSTEMS - PERTINENT POSITIVES ONLY: Intermittent left upper quadrant pain presumed related to nephrolithiasis. No other abdominal complaints.  EXAM: Filed Vitals:   07/26/13 0905  BP: 122/74  Pulse: 72  Temp: 97.5 F (36.4 C)    GENERAL: well-developed, well-nourished, no acute distress HEENT: normocephalic; pupils equal and reactive; sclerae clear; dentition good; mucous membranes moist NECK:  No palpable masses in the thyroid bed; symmetric on extension; no palpable anterior or posterior cervical lymphadenopathy; no supraclavicular masses; no tenderness CHEST: clear to auscultation bilaterally without rales, rhonchi, or wheezes CARDIAC: regular rate and rhythm without significant murmur; peripheral pulses are full ABDOMEN: soft without distension; bowel sounds present; no hernia; healing surgical wounds at the umbilicus and suprapubic region; palpation shows no hepatosplenomegaly and no palpable masses in the upper abdomen EXT:  non-tender without edema; no deformity NEURO: no gross focal deficits; no sign of tremor   LABORATORY RESULTS: See [facility] ([facility]L-Epic) for most recent results  RADIOLOGY RESULTS: See [facility] (CHL-Epic) for most recent results  IMPRESSION: #1 cyst identified in the upper abdomen, presumably arising from the spleen #2 history of hemangioma right lobe of liver, 14 mm #3 history of endometriosis  PLAN: I discussed the above findings at  length with the patient and her husband. We reviewed her MRI scan and CT scans from 2010.  We will request the photographs taken at the time of surgery on 06/19/2013 from the office of Dr. Everlene Farrier.  We will order an abdominal ultrasound to evaluate for possible splenic cyst.  I will contact the patient once I have reviewed the photographs and have  the results of her abdominal ultrasound.  Earnstine Regal, MD, [facility]  Primary Care Physician: Milagros Evener, MD

## 2013-07-26 NOTE — Telephone Encounter (Signed)
LM for pt to return my call regarding her [place] that Dr. Harlow Asa wants her to have.  Please advise pt is is scheduled for 08-01-13, [place], [place] [address] Arrive at 8:15 a.m..  Advise pt NPO after midnight the night before.  Thanks!  Anderson Malta

## 2013-08-01 ENCOUNTER — Ambulatory Visit
Admission: RE | Admit: 2013-08-01 | Discharge: 2013-08-01 | Disposition: A | Discharge status: Home / Self Care | Payer: BC Managed Care – PPO | Source: Ambulatory Visit | Attending: Surgery | Admitting: Surgery

## 2013-08-01 DIAGNOSIS — D734 Cyst of spleen: Secondary | ICD-10-CM

## 2013-08-01 DIAGNOSIS — D1803 Hemangioma of intra-abdominal structures: Secondary | ICD-10-CM

## 2013-08-01 NOTE — Progress Notes (Signed)
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 Hemangioma is noted in liver. Small 2 cm cyst in left kidney.  No pathologic findings.  I called these results to the patient. No further evaluation or treatment necessary.  Will see for surgical care as needed.  Earnstine Regal, MD, [facility] Office: [phone]   ______

## 2014-01-18 ENCOUNTER — Ambulatory Visit (HOSPITAL_COMMUNITY): Payer: BC Managed Care – PPO

## 2014-01-19 ENCOUNTER — Ambulatory Visit (HOSPITAL_COMMUNITY)
Admission: RE | Admit: 2014-01-19 | Discharge: 2014-01-19 | Disposition: A | Discharge status: Home / Self Care | Payer: BC Managed Care – PPO | Source: Ambulatory Visit | Attending: Obstetrics and Gynecology | Admitting: Obstetrics and Gynecology

## 2014-01-23 NOTE — Progress Notes (Signed)
Genetic Counseling  High-Risk Gestation Note  Appointment Date:  01/19/2014 Referred By: Governor Specking, MD Date of Birth:  11-07-1975 Partner: Ellen Vasquez   Pregnancy History: G3P2 Estimated Date of Delivery: 09/06/14 Estimated Gestational Age: 45w1dAttending: MRenella Cunas MD   I met with [name] her husband, Mr. Ellen Vasquez for genetic counseling given that expanded pan-ethnic carrier screening through her REI provider identified Mrs. Ellen Vasquez as a carrier for Autosomal Recessive Polycystic Kidney Disease.   [name] expanded pan-ethnic carrier screening ([facility] facilitated through her [facility] office. The results of the screen were positive for carrier status for Autosomal Recessive Polycystic Kidney Disease (ARPKD). Specifically, the name of the PKHD1 gene mutation she carries is c.107C>T. We discussed that her carrier screening was negative for the additional mutations/26 conditions screened. Thus, her risk to be a carrier for these additional conditions (listed separately in the laboratory report) has been reduced but not eliminated. The prevalence of each condition varies (and often varies with ethnicity). Thus the patient's background risk to be a carrier for each of these various conditions would range, and in some cases be very low or unknown. Similarly, the detection rate varies with each condition and also varies in some cases with ethnicity, ranging from greater than 99% to unknown. We reviewed that a negative carrier screen would thus reduce but not eliminate the chance to be a carrier for these conditions.   Carrier screening was also performed for [name] Dr. YCharlett Langooffice, and results are currently pending. [name] no additional relatives known to be ARPKD carriers and no known relatives with a diagnosis or features of autosomal recessive polycystic kidney disease.  Mr. PBattermanreported no known individuals with ARPKD in his family history, and consanguinity to Mrs. Ellen GERDTSwas denied. He reported Italian/[place] ancestry. The couple have two healthy daughters, ages 965years and 67years old.   Autosomal recessive polycystic kidney disease is a form of congenital hepatorenal fibrocystic disease. The majority of affected individuals classically present in the neonatal period with enlarged echogenic kidneys. Greater than 50% of affected individuals with classic ARPKD progress to end-stage renal disease, typically in the first decade of life. Renal disease is characterized by nephromegaly, hypertension, and varying degrees of renal dysfunction. ARPKD can also present on prenatal ultrasound, with massively enlarged echogenic kidneys and oligohydramnios. Approximately 20-30% of infants with oligohydramnios and subsequent pulmonary hypoplasia die in the neonatal period or within the first year of life from respiratory insufficiency.  Hepatobiliary disease is characterized by hepatomegaly, splenomegaly, risk of ascending cholangitis and progressive portal hypertension.  The two organ systems primarily affected are kidney and liver, but secondary effects are seen in other organ systems. Though most individuals present with classic neonatal presentation, there is variability in age and presenting symptoms related to relative degree of renal and biliary abnormalities. A minority of affected individuals present as older children or young adults with evidence of hepatic dysfunction as the prominent presenting feature.   We reviewed autosomal recessive inheritance of ARPKD. We reviewed that each pregnancy for a carrier couple of an autosomal recessive condition has a 1 in 4 (25%) chance to inherit the condition. Each pregnancy together also has an independent 1 in 2 (50%) chance to be a carrier like the parents and a 1 in 4 (25%) chance to be neither a carrier nor  affected. We reviewed that in autosomal recessive conditions, if one parent is a carrier  and the other is not, the pregnancy would have a 1 in 2 (50%) chance to be a carrier. The diagnosis of ARPKD is typically determined by clinical findings in the proband and the absence of renal disease in the proband's biological parents (to distinguish between Autosomal Dominant Polycystic Kidney Disease). We discussed that mutations in the PKHD1 gene has been found to be causative for ARPKD. Clinical molecular analysis for PKHD1 detects mutations in approximately 85% of individuals with a clinical diagnosis of ARPKD. We reviewed that individuals who are carriers for autosomal recessive conditions, including ARPKD do not typically display clinical features.   The carrier frequency for mutations in PKHD1 is reported to be approximately 1 in 100 in the Caucasian population. Thus, prior to carrier screening for Ellen Vasquez, the risk for ARPKD in the current pregnancy is approximately 1 in 400. Carrier screening for the common mutations PKHD1 performed through [facility] (the laboratory that performed the carrier screen for Mrs. Termine) detects approximately 45% of carriers in the Caucasian population. Thus, a negative screen would reduce, but not eliminate the risk to be a carrier. He understands that pursuing carrier screening for a panel of conditions would also assess for additional conditions for which Ellen Vasquez previously screened negative. Additionally, we discussed that gene sequencing could be performed for PKHD1, which would have a higher detection rate, but also typically a higher cost. It is not known which lab Ellen Vasquez carrier screen was sent to at this time. However, we discussed that it was likely sent to the same laboratory that performed [name]'s carrier screen. We discussed that in the case that both individuals are determined to be carriers for ARPKD, prenatal diagnosis would be  available via chorionic villus sampling (CVS) or amniocentesis. The risks, benefits, and limitations of CVS and amniocentesis were reviewed. Ellen Vasquez stated that she is not interested in invasive, diagnostic testing (CVS or amniocentesis) in the current pregnancy and would not be interested in these even if Ellen Vasquez were also found to be a carrier for ARPKD.   Both family histories were reviewed and were otherwise contributory for otosclerosis. The patient reported that she has this and that her father possibly has features of hearing loss but does not currently seek follow-up. Otosclerosis is characterized by isolated endochondral bone sclerosis of the labyrinth capsule. Approximately 90% of affected individuals are under age 20 years at the time of diagnosis, and mean age of onset is in the third decade. Genetic heterogeneity exists for otosclerosis, meaning various genes have been identified to be causative of otosclerosis. Autosomal dominant inheritance with reduced penetrance is described. In autosomal dominant inheritance, each offspring of an individual with a condition has a 1 in 2 (50%) chance to inherit the condition. Reduced penetrance describes where an individual may inherit the gene that predisposes to the condition but not display features of the condition. Penetrance has been estimated to be less than 50%. Thus, the risk for offspring of an affected individual would be estimated to be approximately 25% (accounting to autosomal dominant inheritance and reduced penetrance).   Ellen Vasquez also reported that she has been followed by neurology in the past, given a history of migraines and was found to have Chiari malformation, incidentally during a work-up regarding her migraines. We discussed that a Chiari malformation (CM) is a structural defect in the cerebellum. When the indented bony space at the lower rear of the skull is smaller than normal, the cerebellum and brain stem can be  pushed  downward. The resulting pressure on the cerebellum can block the flow of cerebrospinal fluid causing symptoms. While a specific etiology has not been determined in most families, this can occur as an isolated finding or be part of an underlying disorder. There have been reports in the medical literature of families in which more than one family member was affected by a Chiari malformation. This suggests that in some cases genetic factors play a role in the development of a Chiari malformation. Research is ongoing to determine the specific genetic components that influence the development of Chiari malformations in some people. However, at this time, specific recurrence information or genetic testing is not available. Without further information regarding the provided family history, an accurate genetic risk cannot be calculated. Further genetic counseling is warranted if more information is obtained.  They were counseled regarding maternal age and the association with risk for chromosome conditions due to nondisjunction with aging of the ova.   We reviewed chromosomes, nondisjunction, and the associated 1 in 31 risk for fetal aneuploidy related to a maternal age of 38 y.o. at the current gestation.  They were counseled that the risk for aneuploidy decreases as gestational age increases, accounting for those pregnancies which spontaneously abort.  We specifically discussed Down syndrome (trisomy 92), trisomies 46 and 60, and sex chromosome aneuploidies (47,XXX and 47,XXY) including the common features and prognoses of each.   We reviewed available screening options including First Screen, Quad screen, noninvasive prenatal screening (NIPS)/cell free DNA (cfDNA) testing, and detailed ultrasound.  They were counseled that screening tests are used to modify a patient's a priori risk for aneuploidy, typically based on age. This estimate provides a pregnancy specific risk assessment. We reviewed the benefits and  limitations of each option. Specifically, we discussed the conditions for which each test screens, the detection rates, and false positive rates of each. They were also counseled regarding diagnostic testing via CVS and amniocentesis. The couple stated that they were not interested in screening or testing for fetal aneuploidy in the current pregnancy.     Ellen Vasquez denied exposure to environmental toxins or chemical agents. She denied the use of tobacco or street drugs. She reported drinking one alcoholic drink on 39/53. The all-or-none period was discussed, meaning exposures that occur in the first 4 weeks of gestation are typically thought to either not affect the pregnancy at all or result in a miscarriage. She denied significant viral illnesses during the course of her pregnancy. Mrs. Vaux reported that she recently had abdominal imaging in June 2015, ordered by Dr. Harlow Asa at [facility]. 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 We discussed that this is not likely expected to be related to the carrier status for ARPKD. Her medical and surgical histories were otherwise noncontributory.   I counseled this couple regarding the above risks and available options. The approximate face-to-face time with the genetic counselor was 40 minutes.    Chipper Oman, MS Certified Genetic Counselor 01/23/2014

## 2014-01-24 ENCOUNTER — Other Ambulatory Visit (HOSPITAL_COMMUNITY): Payer: Self-pay

## 2014-02-07 ENCOUNTER — Inpatient Hospital Stay (HOSPITAL_COMMUNITY)
Admission: AD | Admit: 2014-02-07 | Discharge: 2014-02-07 | Disposition: A | Discharge status: Home / Self Care | Payer: BC Managed Care – PPO | Source: Ambulatory Visit | Attending: Obstetrics and Gynecology | Admitting: Obstetrics and Gynecology

## 2014-02-07 ENCOUNTER — Inpatient Hospital Stay (HOSPITAL_COMMUNITY): Payer: BC Managed Care – PPO

## 2014-02-07 ENCOUNTER — Encounter (HOSPITAL_COMMUNITY): Payer: Self-pay

## 2014-02-07 DIAGNOSIS — Z3A09 9 weeks gestation of pregnancy: Secondary | ICD-10-CM | POA: Insufficient documentation

## 2014-02-07 DIAGNOSIS — Z87891 Personal history of nicotine dependence: Secondary | ICD-10-CM | POA: Insufficient documentation

## 2014-02-07 DIAGNOSIS — R109 Unspecified abdominal pain: Secondary | ICD-10-CM

## 2014-02-07 DIAGNOSIS — O99611 Diseases of the digestive system complicating pregnancy, first trimester: Secondary | ICD-10-CM | POA: Diagnosis not present

## 2014-02-07 DIAGNOSIS — A084 Viral intestinal infection, unspecified: Secondary | ICD-10-CM | POA: Diagnosis not present

## 2014-02-07 DIAGNOSIS — O9989 Other specified diseases and conditions complicating pregnancy, childbirth and the puerperium: Secondary | ICD-10-CM

## 2014-02-07 DIAGNOSIS — O26899 Other specified pregnancy related conditions, unspecified trimester: Secondary | ICD-10-CM

## 2014-02-07 DIAGNOSIS — O210 Mild hyperemesis gravidarum: Secondary | ICD-10-CM | POA: Diagnosis present

## 2014-02-07 HISTORY — DX: Unspecified otosclerosis, unspecified ear: H80.90

## 2014-02-07 HISTORY — DX: Headache: R51

## 2014-02-07 HISTORY — DX: Headache, unspecified: R51.9

## 2014-02-07 HISTORY — DX: Endometriosis, unspecified: N80.9

## 2014-02-07 LAB — URINALYSIS, ROUTINE W REFLEX MICROSCOPIC
BILIRUBIN URINE: NEGATIVE
GLUCOSE, UA: NEGATIVE mg/dL
Hgb urine dipstick: NEGATIVE
Ketones, ur: 15 mg/dL — AB
Leukocytes, UA: NEGATIVE
Nitrite: NEGATIVE
PH: 7.5 (ref 5.0–8.0)
Protein, ur: NEGATIVE mg/dL
Specific Gravity, Urine: 1.02 (ref 1.005–1.030)
Urobilinogen, UA: 0.2 mg/dL (ref 0.0–1.0)

## 2014-02-07 MED ORDER — LACTATED RINGERS IV BOLUS (SEPSIS)
1000.0000 mL | Freq: Once | INTRAVENOUS | Status: AC
  Administered 2014-02-07: 1000 mL via INTRAVENOUS

## 2014-02-07 MED ORDER — ACETAMINOPHEN 325 MG PO TABS
650.0000 mg | ORAL_TABLET | Freq: Once | ORAL | Status: AC
  Administered 2014-02-07: 650 mg via ORAL
  Filled 2014-02-07: qty 2

## 2014-02-07 MED ORDER — PROMETHAZINE HCL 25 MG/ML IJ SOLN
25.0000 mg | Freq: Once | INTRAVENOUS | Status: AC
  Administered 2014-02-07: 25 mg via INTRAVENOUS
  Filled 2014-02-07: qty 1

## 2014-02-07 NOTE — MAU Provider Note (Signed)
Chief Complaint: Emesis; Headache; Diarrhea; and Abdominal Pain   First Provider Initiated Contact with Patient 02/07/14 1038     SUBJECTIVE HPI: Ellen Vasquez is a 38 y.o. G3P2002 at [redacted]w[redacted]d by LMP who presents to maternity admissions reporting nausea/vomiting/diarrhea, and headache and abdominal pain starting early this morning.  She denies sick contacts but reports she did eat some raw cookie dough without thinking about it yesterday while making cookies with her kids.  She is very worried about the pregnancy and reports when she called the office she was told we may do an ultrasound.  She denies vaginal bleeding, vaginal itching/burning, urinary symptoms, dizziness, or fever/chills.     Past Medical History  Diagnosis Date  . Headache   . Endometriosis 1997  . Otosclerosis 2008   Past Surgical History  Procedure Laterality Date  . Abdominal hysterectomy    . Wisdom tooth extraction    . Shoulder surgery    . Tonsillectomy and adenoidectomy    . Laparoscopic abdominal exploration  06/2013    has had 4 surgeries  . Dilation and curettage of uterus  May 2015   History   Social History  . Marital Status: Married    Spouse Name: N/A    Number of Children: N/A  . Years of Education: N/A   Occupational History  . Not on file.   Social History Main Topics  . Smoking status: Former Smoker    Types: Cigarettes    Quit date: 11/25/2001  . Smokeless tobacco: Not on file  . Alcohol Use: Yes  . Drug Use: No  . Sexual Activity: Not on file   Other Topics Concern  . Not on file   Social History Narrative   No current facility-administered medications on file prior to encounter.   No current outpatient prescriptions on file prior to encounter.   Allergies  Allergen Reactions  . Shellfish Allergy Hives    ROS: Pertinent items in HPI  OBJECTIVE Blood pressure 127/75, pulse 103, temperature 99.1 F (37.3 C), temperature source Oral, resp. rate 18, height 5\' 9"  (1.753  m), weight 70.852 kg (156 lb 3.2 oz), SpO2 99 %. GENERAL: Well-developed, well-nourished female in no acute distress.  HEENT: Normocephalic HEART: normal rate RESP: normal effort ABDOMEN: Soft, non-tender EXTREMITIES: Nontender, no edema NEURO: Alert and oriented   LAB RESULTS Results for orders placed or performed during the hospital encounter of 02/07/14 (from the past 24 hour(s))  Urinalysis, Routine w reflex microscopic     Status: Abnormal   Collection Time: 02/07/14 10:16 AM  Result Value Ref Range   Color, Urine YELLOW YELLOW   APPearance HAZY (A) CLEAR   Specific Gravity, Urine 1.020 1.005 - 1.030   pH 7.5 5.0 - 8.0   Glucose, UA NEGATIVE NEGATIVE mg/dL   Hgb urine dipstick NEGATIVE NEGATIVE   Bilirubin Urine NEGATIVE NEGATIVE   Ketones, ur 15 (A) NEGATIVE mg/dL   Protein, ur NEGATIVE NEGATIVE mg/dL   Urobilinogen, UA 0.2 0.0 - 1.0 mg/dL   Nitrite NEGATIVE NEGATIVE   Leukocytes, UA NEGATIVE NEGATIVE    IMAGING US Ob Comp Less 14 Wks  02/07/2014   CLINICAL DATA:  Abdominal pain affecting pregnancy. Gestational age by LMP of 9 weeks 6 days.  EXAM: OBSTETRIC <14 WK ULTRASOUND  TECHNIQUE: Transabdominal ultrasound was performed for evaluation of the gestation as well as the maternal uterus and adnexal regions.  COMPARISON:  None.  FINDINGS: Intrauterine gestational sac: Visualized/normal in shape.  Yolk sac:  Visualized  Embryo:  Visualized  Cardiac Activity: Visualized  Heart Rate: 192 bpm  CRL:   27  mm   9 w 4 d                  [place] EDC: 09/08/2014  Maternal uterus/adnexae: Left ovary is normal in appearance. Right ovary not directly visualized, however no mass or free fluid identified.  IMPRESSION: Single living IUP measuring 9 weeks 4 days with [place] EDC of 09/08/2014. This is concordant with LMP.  No significant maternal uterine or adnexal abnormality identified.   Electronically Signed   By: Earle Gell M.D.   On: 02/07/2014 14:32    ASSESSMENT 1. Viral gastroenteritis    2. Abdominal pain affecting pregnancy     PLAN D5LR with Phenergan 25 mg IV x 1000 ml, then LR 1000 ml in MAU Tylenol 650 mg PO Discharge home Increase PO fluids Rest over next few days F/U as scheduled in office Return to MAU with worsening or persistent symptoms    Medication List    TAKE these medications        calcium carbonate 500 MG chewable tablet  Commonly known as:  TUMS - dosed in mg elemental calcium  Chew 1 tablet by mouth as needed for indigestion or heartburn.     prenatal multivitamin Tabs tablet  Take 1 tablet by mouth daily at 12 noon.           Follow-up Information    Follow up with ADKINS,GRETCHEN, MD.   Specialty:  Obstetrics and Gynecology   Why:  As scheduled   Contact information:   [place], [place]  [address] [phone]       Follow up with [facility].   Why:  As needed for emergencies   Contact information:   [address] [id number] [place] Certified Nurse-Midwife 02/07/2014  2:53 PM

## 2014-02-07 NOTE — Discharge Instructions (Signed)
Viral Gastroenteritis Viral gastroenteritis is also known as stomach flu. This condition affects the stomach and intestinal tract. It can cause sudden diarrhea and vomiting. The illness typically lasts 3 to 8 days. Most people develop an immune response that eventually gets rid of the virus. While this natural response develops, the virus can make you quite ill. CAUSES  Many different viruses can cause gastroenteritis, such as rotavirus or noroviruses. You can catch one of these viruses by consuming contaminated food or water. You may also catch a virus by sharing utensils or other personal items with an infected person or by touching a contaminated surface. SYMPTOMS  The most common symptoms are diarrhea and vomiting. These problems can cause a severe loss of body fluids (dehydration) and a body salt (electrolyte) imbalance. Other symptoms may include:  Fever.  Headache.  Fatigue.  Abdominal pain. DIAGNOSIS  Your caregiver can usually diagnose viral gastroenteritis based on your symptoms and a physical exam. A stool sample may also be taken to test for the presence of viruses or other infections. TREATMENT  This illness typically goes away on its own. Treatments are aimed at rehydration. The most serious cases of viral gastroenteritis involve vomiting so severely that you are not able to keep fluids down. In these cases, fluids must be given through an intravenous line (IV). HOME CARE INSTRUCTIONS   Drink enough fluids to keep your urine clear or pale yellow. Drink small amounts of fluids frequently and increase the amounts as tolerated.  Ask your caregiver for specific rehydration instructions.  Avoid:  Foods high in sugar.  Alcohol.  Carbonated drinks.  Tobacco.  Juice.  Caffeine drinks.  Extremely hot or cold fluids.  Fatty, greasy foods.  Too much intake of anything at one time.  Dairy products until 24 to 48 hours after diarrhea stops.  You may consume probiotics.  Probiotics are active cultures of beneficial bacteria. They may lessen the amount and number of diarrheal stools in adults. Probiotics can be found in yogurt with active cultures and in supplements.  Wash your hands well to avoid spreading the virus.  Only take over-the-counter or prescription medicines for pain, discomfort, or fever as directed by your caregiver. Do not give aspirin to children. Antidiarrheal medicines are not recommended.  Ask your caregiver if you should continue to take your regular prescribed and over-the-counter medicines.  Keep all follow-up appointments as directed by your caregiver. SEEK IMMEDIATE MEDICAL CARE IF:   You are unable to keep fluids down.  You do not urinate at least once every 6 to 8 hours.  You develop shortness of breath.  You notice blood in your stool or vomit. This may look like coffee grounds.  You have abdominal pain that increases or is concentrated in one small area (localized).  You have persistent vomiting or diarrhea.  You have a fever.  The patient is a child younger than 3 months, and he or she has a fever.  The patient is a child older than 3 months, and he or she has a fever and persistent symptoms.  The patient is a child older than 3 months, and he or she has a fever and symptoms suddenly get worse.  The patient is a baby, and he or she has no tears when crying. MAKE SURE YOU:   Understand these instructions.  Will watch your condition.  Will get help right away if you are not doing well or get worse. Document Released: 02/02/2005 Document Revised: 04/27/2011 Document Reviewed: 11/19/2010  ExitCare Patient Information 2015 [place]. This information is not intended to replace advice given to you by your health care provider. Make sure you discuss any questions you have with your health care provider. Abdominal Pain During Pregnancy Abdominal pain is common in pregnancy. Most of the time, it does not cause harm.  There are many causes of abdominal pain. Some causes are more serious than others. Some of the causes of abdominal pain in pregnancy are easily diagnosed. Occasionally, the diagnosis takes time to understand. Other times, the cause is not determined. Abdominal pain can be a sign that something is very wrong with the pregnancy, or the pain may have nothing to do with the pregnancy at all. For this reason, always tell your health care provider if you have any abdominal discomfort. HOME CARE INSTRUCTIONS  Monitor your abdominal pain for any changes. The following actions may help to alleviate any discomfort you are experiencing:  Do not have sexual intercourse or put anything in your vagina until your symptoms go away completely.  Get plenty of rest until your pain improves.  Drink clear fluids if you feel nauseous. Avoid solid food as long as you are uncomfortable or nauseous.  Only take over-the-counter or prescription medicine as directed by your health care provider.  Keep all follow-up appointments with your health care provider. SEEK IMMEDIATE MEDICAL CARE IF:  You are bleeding, leaking fluid, or passing tissue from the vagina.  You have increasing pain or cramping.  You have persistent vomiting.  You have painful or bloody urination.  You have a fever.  You notice a decrease in your baby's movements.  You have extreme weakness or feel faint.  You have shortness of breath, with or without abdominal pain.  You develop a severe headache with abdominal pain.  You have abnormal vaginal discharge with abdominal pain.  You have persistent diarrhea.  You have abdominal pain that continues even after rest, or gets worse. MAKE SURE YOU:   Understand these instructions.  Will watch your condition.  Will get help right away if you are not doing well or get worse. Document Released: 02/02/2005 Document Revised: 11/23/2012 Document Reviewed: 09/01/2012 [facility] Patient  Information 2015 [place], [place]. This information is not intended to replace advice given to you by your health care provider. Make sure you discuss any questions you have with your health care provider.

## 2014-02-07 NOTE — MAU Note (Signed)
Patient states she went out to dinner last night and started having a headache last night. Started having nausea, vomiting, diarrhea and abdominal cramping during the night.

## 2014-02-14 LAB — OB RESULTS CONSOLE RPR: RPR: NONREACTIVE

## 2014-02-14 LAB — OB RESULTS CONSOLE HEPATITIS B SURFACE ANTIGEN: Hepatitis B Surface Ag: NEGATIVE

## 2014-02-14 LAB — OB RESULTS CONSOLE HIV ANTIBODY (ROUTINE TESTING): HIV: NONREACTIVE

## 2014-02-14 LAB — OB RESULTS CONSOLE RUBELLA ANTIBODY, IGM: RUBELLA: IMMUNE

## 2014-02-21 ENCOUNTER — Other Ambulatory Visit: Payer: Self-pay | Admitting: Obstetrics and Gynecology

## 2014-02-22 LAB — CYTOLOGY - PAP

## 2014-05-02 LAB — US OB FOLLOW UP

## 2014-05-03 ENCOUNTER — Other Ambulatory Visit (HOSPITAL_COMMUNITY): Payer: Self-pay | Admitting: Obstetrics and Gynecology

## 2014-05-03 DIAGNOSIS — Z3A23 23 weeks gestation of pregnancy: Secondary | ICD-10-CM

## 2014-05-03 DIAGNOSIS — O283 Abnormal ultrasonic finding on antenatal screening of mother: Secondary | ICD-10-CM

## 2014-05-03 DIAGNOSIS — Z3689 Encounter for other specified antenatal screening: Secondary | ICD-10-CM

## 2014-05-11 ENCOUNTER — Encounter (HOSPITAL_COMMUNITY): Payer: Self-pay

## 2014-05-11 ENCOUNTER — Encounter: Payer: Self-pay | Admitting: Neurology

## 2014-05-11 ENCOUNTER — Other Ambulatory Visit (HOSPITAL_COMMUNITY): Payer: Self-pay | Admitting: Obstetrics and Gynecology

## 2014-05-11 ENCOUNTER — Ambulatory Visit (HOSPITAL_COMMUNITY)
Admission: RE | Admit: 2014-05-11 | Discharge: 2014-05-11 | Disposition: A | Discharge status: Home / Self Care | Payer: BLUE CROSS/BLUE SHIELD | Source: Ambulatory Visit | Attending: Obstetrics and Gynecology | Admitting: Obstetrics and Gynecology

## 2014-05-11 ENCOUNTER — Ambulatory Visit (INDEPENDENT_AMBULATORY_CARE_PROVIDER_SITE_OTHER): Payer: BLUE CROSS/BLUE SHIELD | Admitting: Neurology

## 2014-05-11 VITALS — BP 113/69 | HR 88 | Resp 16 | Ht 69.0 in | Wt 172.8 lb

## 2014-05-11 DIAGNOSIS — Z36 Encounter for antenatal screening of mother: Secondary | ICD-10-CM | POA: Insufficient documentation

## 2014-05-11 DIAGNOSIS — R51 Headache: Secondary | ICD-10-CM

## 2014-05-11 DIAGNOSIS — R519 Headache, unspecified: Secondary | ICD-10-CM

## 2014-05-11 DIAGNOSIS — Z3689 Encounter for other specified antenatal screening: Secondary | ICD-10-CM

## 2014-05-11 DIAGNOSIS — Z3A23 23 weeks gestation of pregnancy: Secondary | ICD-10-CM

## 2014-05-11 DIAGNOSIS — O283 Abnormal ultrasonic finding on antenatal screening of mother: Secondary | ICD-10-CM | POA: Insufficient documentation

## 2014-05-11 DIAGNOSIS — G44009 Cluster headache syndrome, unspecified, not intractable: Secondary | ICD-10-CM | POA: Diagnosis not present

## 2014-05-11 DIAGNOSIS — O26892 Other specified pregnancy related conditions, second trimester: Secondary | ICD-10-CM | POA: Diagnosis not present

## 2014-05-11 DIAGNOSIS — O26899 Other specified pregnancy related conditions, unspecified trimester: Secondary | ICD-10-CM | POA: Insufficient documentation

## 2014-05-11 MED ORDER — DOXYLAMINE SUCCINATE (SLEEP) 25 MG PO TABS: 25.0000 mg | ORAL_TABLET | Freq: Every evening | ORAL | Status: DC | PRN

## 2014-05-11 MED ORDER — ONDANSETRON HCL 8 MG PO TABS: 8.0000 mg | ORAL_TABLET | Freq: Three times a day (TID) | ORAL | Status: DC | PRN

## 2014-05-11 NOTE — Progress Notes (Signed)
Provider:  Larey Seat, M D  Referring Provider: Aretta Nip, MD Primary Care Physician:  Milagros Evener, MD  Chief Complaint  Patient presents with  . NP [place] Migraines    Rm 10, alone  Pregnant  [redacted]wks    HPI:  Ellen Vasquez is a 39 y.o. female, seen here as a referral from Dr. Radene Ou for Migraines in pregnancy,  This patient is currently [redacted] weeks pregnant, in her third pregnancy. Dr. Patrecia Pour had previously encountered migraines in her pregnancies but stated that with fertility treatments and the onset of the third pregnancy her migraines have a different quality and frequency to it. Most recently she has noticed that the migraines awaken her from sleep I'll severe associated with nausea, vomiting, the right side of her face with a throbbing quality above and behind the eye. The left face goes numb at the time the right face hurts.  She describes a visual aura with the exact lines and flashing lights. She also has severe and some vertigo. She feels that the headaches take all her strengths out of her and less productive and certainly less functional. The spells last a couple of hours before the pain subsides. But there is a latent headache that can last days in the background. She has palpitations and a shortness of breath at night, too. She sleeps on 2-3 pillows.  Just over the last week she has been woken up I headaches frequently after midnight and about every hour to 90 minutes.  With her second  pregnancy at age 39 she had the first time encountered any migrainous headaches. She had other forms of headaches before and sometimes intensely painful headaches but not migrainous.  Review of Systems: Out of a complete 14 system review, the patient complains of only the following symptoms, and all other reviewed systems are negative.   History   Social History  . Marital Status: Married    Spouse Name: N/A  . Number of Children: 2  . Years of  Education: Phd ED   Occupational History  . Professor     [employer]   Social History Main Topics  . Smoking status: Former Smoker    Types: Cigarettes    Quit date: 11/25/2001  . Smokeless tobacco: Not on file  . Alcohol Use: No  . Drug Use: No  . Sexual Activity: Not on file   Other Topics Concern  . Not on file   Social History Narrative   Caffeine none (occasional soda).    Family History  Problem Relation Age of Onset  . Asthma Father   . Cancer Maternal Grandmother     skin  . Hypertension Other   . Hyperlipidemia Other   . Stroke Other   . Obesity Other   . Arthritis Father     Rheumatoid  . Aneurysm Paternal Grandfather     brain  . Skin cancer Paternal Grandmother     Past Medical History  Diagnosis Date  . Headache   . Endometriosis 1997  . Otosclerosis 2008  . Chiari malformation type I     MRI [facility] 2013  . Anxiety   . Headache in pregnancy     Past Surgical History  Procedure Laterality Date  . Wisdom tooth extraction  2008  . Shoulder surgery  2000  . Tonsillectomy and adenoidectomy  2010  . Laparoscopic abdominal exploration  06/2013    has had 4 surgeries  . Dilation and curettage of  uterus  May 2015    Current Outpatient Prescriptions  Medication Sig Dispense Refill  . acetaminophen (TYLENOL) 500 MG tablet Take 500 mg by mouth daily as needed for headache.    . calcium carbonate (TUMS - DOSED IN MG ELEMENTAL CALCIUM) 500 MG chewable tablet Chew 1 tablet by mouth as needed for indigestion or heartburn.    . Prenatal Vit-Fe Fumarate-FA (PRENATAL MULTIVITAMIN) TABS tablet Take 1 tablet by mouth daily at 12 noon.    . sertraline (ZOLOFT) 50 MG tablet Take 50 mg by mouth daily.     No current facility-administered medications for this visit.    Allergies as of 05/11/2014 - Review Complete 05/11/2014  Allergen Reaction Noted  . Shellfish allergy Hives 01/04/2013    Vitals: BP 113/69 mmHg  Pulse 88  Resp 16  Ht 5\' 9"  (1.753 m)   Wt 172 lb 12.8 oz (78.382 kg)  BMI 25.51 kg/m2 Last Weight:  Wt Readings from Last 1 Encounters:  05/11/14 172 lb 12.8 oz (78.382 kg)   Last Height:   Ht Readings from Last 1 Encounters:  05/11/14 5\' 9"  (1.753 m)    Physical exam:  General: The patient is awake, alert and appears not in acute distress. The patient is well groomed. Head: Normocephalic, atraumatic. Neck is supple. Mallampati 2 , neck circumference: 14  Cardiovascular:  Regular rate and rhythm , without  murmurs or carotid bruit, and without distended neck veins. Respiratory: Lungs are clear to auscultation. Skin:  Without evidence of edema, or rash Trunk: BMI is elevated and patient  has normal posture.  Neurologic exam : The patient is awake and alert, oriented to place and time.  Memory subjective   described as intact. There is a normal attention span & concentration ability. Speech is fluent without  dysarthria, dysphonia or aphasia. Mood and affect are appropriate.  Cranial nerves: Pupils are equal and briskly reactive to light. Funduscopic exam without   evidence of pallor or edema. Extraocular movements  in vertical and horizontal planes intact and without nystagmus.  Visual fields by finger perimetry are intact. I have noted a slight lower eyelid tic on the right side, there is no asymmetric flushing no rash or puffiness. Hearing to finger rub intact.  Facial sensation intact to fine touch. Facial motor strength is symmetric and tongue and uvula move midline. Tongue protrusion into either cheek is normal.  Shoulder shrug is normal.   Motor exam:   Normal tone, muscle bulk and symmetric strength in all extremities.  Sensory:  Fine touch, pinprick and vibration were tested in all extremities. Proprioception was normal.  Coordination: Rapid alternating movements in the fingers/hands were normal. Finger-to-nose maneuver  normal without evidence of ataxia, dysmetria or tremor.  Gait and station: Patient walks  without assistive device and is able unassisted to climb up to the exam table. Strength within normal limits. Stance is stable and normal. Tandem gait is unfragmented. Romberg testing is negative   Deep tendon reflexes: in the  upper and lower extremities are symmetric and intact. Babinski maneuver response is downgoing.   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 Vernona Rieger in [facility]. She was evaluated for aneurysms by MRI and MRA and none was found. I think  we do not have to follow that lead.  Her migraines begun in her second pregnancy and has exacerbated with the third. The onset of migraines was even before the pregnancy was reached , duing her fertility treatments. These are clearly hormonal exacerbated headaches also she does not have a history of catamenial migraines. They have woken her out of sleep with a cluster character lasting about 30 minutes sometimes longer associated with severe vomiting, vertigo, and recently also some shortness of breath and palpitations have been noted at night independent of the headaches.  She has reached successfully the 23rd week of her pregnancy. At that stage it should be safe for her to used topiramate as a migraine prophylaxis. She has already had Tylenol 3 prescribed for pain relief but not prevention. Prior to her pregnancy she has taken nortriptyline and sumatriptan. I explained that this the beginning of the third trimester of pregnancy shortness of breath was expected as our chest cavity available for breathing has become smaller. It is also usually is at time of higher heart rates. I would like for her to continue sleeping with the head of bed  elevated slightly propped up. I would suggest in pregnancy to used Unisom and over-the-counter agent as a sleep aid that has been found to be safe in pregnancy. I will order a home sleep test for the patient mainly because I want to see her heart rate and rhythm at night but also to see if her headache frequency is correlated to times of hypoxemia or irregular breathing.         Plan:  Treatment plan and additional workup :     Larey Seat MD 05/11/2014

## 2014-05-14 DIAGNOSIS — O4190X0 Disorder of amniotic fluid and membranes, unspecified, unspecified trimester, not applicable or unspecified: Secondary | ICD-10-CM | POA: Insufficient documentation

## 2014-05-14 DIAGNOSIS — O283 Abnormal ultrasonic finding on antenatal screening of mother: Secondary | ICD-10-CM | POA: Insufficient documentation

## 2014-05-14 DIAGNOSIS — Z3A23 23 weeks gestation of pregnancy: Secondary | ICD-10-CM | POA: Insufficient documentation

## 2014-05-14 DIAGNOSIS — O09529 Supervision of elderly multigravida, unspecified trimester: Secondary | ICD-10-CM | POA: Insufficient documentation

## 2014-05-14 DIAGNOSIS — Z3689 Encounter for other specified antenatal screening: Secondary | ICD-10-CM | POA: Insufficient documentation

## 2014-05-15 ENCOUNTER — Encounter (HOSPITAL_COMMUNITY): Payer: Self-pay | Admitting: Obstetrics and Gynecology

## 2014-05-15 ENCOUNTER — Other Ambulatory Visit (HOSPITAL_COMMUNITY): Payer: Self-pay | Admitting: Obstetrics and Gynecology

## 2014-05-18 ENCOUNTER — Ambulatory Visit (INDEPENDENT_AMBULATORY_CARE_PROVIDER_SITE_OTHER): Payer: BLUE CROSS/BLUE SHIELD | Admitting: Neurology

## 2014-05-18 DIAGNOSIS — G44009 Cluster headache syndrome, unspecified, not intractable: Secondary | ICD-10-CM

## 2014-05-18 DIAGNOSIS — G478 Other sleep disorders: Secondary | ICD-10-CM | POA: Diagnosis not present

## 2014-05-18 DIAGNOSIS — O26892 Other specified pregnancy related conditions, second trimester: Secondary | ICD-10-CM

## 2014-05-18 DIAGNOSIS — R519 Headache, unspecified: Secondary | ICD-10-CM

## 2014-05-18 DIAGNOSIS — R51 Headache: Principal | ICD-10-CM

## 2014-05-18 NOTE — Sleep Study (Signed)
Please see the scanned sleep study interpretation located in the Procedure tab within the Chart Review section. 

## 2014-06-08 ENCOUNTER — Ambulatory Visit: Payer: BLUE CROSS/BLUE SHIELD | Admitting: Adult Health

## 2014-06-12 ENCOUNTER — Inpatient Hospital Stay (HOSPITAL_COMMUNITY)
Admission: AD | Admit: 2014-06-12 | Discharge: 2014-06-12 | Disposition: A | Discharge status: Home / Self Care | Payer: BLUE CROSS/BLUE SHIELD | Source: Ambulatory Visit | Attending: Obstetrics and Gynecology | Admitting: Obstetrics and Gynecology

## 2014-06-12 ENCOUNTER — Encounter (HOSPITAL_COMMUNITY): Payer: Self-pay | Admitting: *Deleted

## 2014-06-12 DIAGNOSIS — Z3A28 28 weeks gestation of pregnancy: Secondary | ICD-10-CM | POA: Insufficient documentation

## 2014-06-12 DIAGNOSIS — R42 Dizziness and giddiness: Secondary | ICD-10-CM

## 2014-06-12 DIAGNOSIS — O4702 False labor before 37 completed weeks of gestation, second trimester: Secondary | ICD-10-CM

## 2014-06-12 LAB — COMPREHENSIVE METABOLIC PANEL
ALT: 17 U/L (ref 0–35)
AST: 19 U/L (ref 0–37)
Albumin: 3.3 g/dL — ABNORMAL LOW (ref 3.5–5.2)
Alkaline Phosphatase: 61 U/L (ref 39–117)
Anion gap: 5 (ref 5–15)
BUN: 8 mg/dL (ref 6–23)
CHLORIDE: 108 mmol/L (ref 96–112)
CO2: 23 mmol/L (ref 19–32)
Calcium: 8.7 mg/dL (ref 8.4–10.5)
Creatinine, Ser: 0.56 mg/dL (ref 0.50–1.10)
GFR calc non Af Amer: >90 mL/min (ref >90)
Glucose, Bld: 87 mg/dL (ref 70–99)
POTASSIUM: 3.6 mmol/L (ref 3.5–5.1)
SODIUM: 136 mmol/L (ref 135–145)
TOTAL PROTEIN: 5.8 g/dL — AB (ref 6.0–8.3)
Total Bilirubin: 0.2 mg/dL — ABNORMAL LOW (ref 0.3–1.2)

## 2014-06-12 LAB — URINALYSIS, ROUTINE W REFLEX MICROSCOPIC
BILIRUBIN URINE: NEGATIVE
GLUCOSE, UA: NEGATIVE mg/dL
HGB URINE DIPSTICK: NEGATIVE
KETONES UR: NEGATIVE mg/dL
LEUKOCYTES UA: NEGATIVE
NITRITE: NEGATIVE
PROTEIN: NEGATIVE mg/dL
Specific Gravity, Urine: 1.01 (ref 1.005–1.030)
Urobilinogen, UA: 0.2 mg/dL (ref 0.0–1.0)
pH: 6.5 (ref 5.0–8.0)

## 2014-06-12 LAB — WET PREP, GENITAL
Clue Cells Wet Prep HPF POC: NONE SEEN
TRICH WET PREP: NONE SEEN
Yeast Wet Prep HPF POC: NONE SEEN

## 2014-06-12 LAB — CBC
HCT: 34.4 % — ABNORMAL LOW (ref 36.0–46.0)
Hemoglobin: 12 g/dL (ref 12.0–15.0)
MCH: 31.7 pg (ref 26.0–34.0)
MCHC: 34.9 g/dL (ref 30.0–36.0)
MCV: 91 fL (ref 78.0–100.0)
PLATELETS: 205 10*3/uL (ref 150–400)
RBC: 3.78 MIL/uL — AB (ref 3.87–5.11)
RDW: 13.2 % (ref 11.5–15.5)
WBC: 11.7 10*3/uL — ABNORMAL HIGH (ref 4.0–10.5)

## 2014-06-12 NOTE — MAU Provider Note (Signed)
Chief Complaint:  preterm contractions  and Near Syncope  First Provider Initiated Contact with Patient 06/12/14 1719     HPI: Ellen Vasquez is a 39 y.o. G3P2002 at [redacted]w[redacted]d who presents to maternity admissions reporting: 1. Several episodes of dizziness and seeing stars over the past 4 days, worse w/ contractions. 2. 4 UC's per hour today. 3. Heart palpitations x 2 weeks. Mostly in the evening. Has sensation of rapid heartbeat and heart pounding. No improvement w/ rest and hydration. No relationship w/ activity, position changes or anxiety. Able to eat and drink normally. Patient states she is very conscientious about staying well-hydrated and having snacks every 2 hours. No Hx of tachycardia or palpitations outside of pregnancy or prior pregnancies. Denies taking any meds or having any caffeine at the time of these episodes.    Denies contractions, leakage of fluid or vaginal bleeding. Good fetal movement.   Pregnancy Course: No hypertension. Baby has heart arrythmia & amniotic bands (that do not appear to involve baby).  Past Medical History: Past Medical History  Diagnosis Date  . Headache   . Endometriosis 1997  . Otosclerosis 2008  . Chiari malformation type I     MRI [facility] 2013  . Anxiety   . Headache in pregnancy     Past obstetric history: OB History  Gravida Para Term Preterm AB SAB TAB Ectopic Multiple Living  3 2 2       2     # Outcome Date GA Lbr Len/2nd Weight Sex Delivery Anes PTL Lv  3 Current           2 Term 08/23/07 [redacted]w[redacted]d   F Vag-Spont EPI Y Y  1 Term 10/21/04 [redacted]w[redacted]d   F Vag-Spont EPI N Y      Past Surgical History: Past Surgical History  Procedure Laterality Date  . Wisdom tooth extraction  2008  . Shoulder surgery  2000  . Tonsillectomy and adenoidectomy  2010  . Laparoscopic abdominal exploration  06/2013    has had 4 surgeries  . Dilation and curettage of uterus  May 2015     Family History: Family History  Problem Relation Age of Onset  .  Asthma Father   . Cancer Maternal Grandmother     skin  . Hypertension Other   . Hyperlipidemia Other   . Stroke Other   . Obesity Other   . Arthritis Father     Rheumatoid  . Aneurysm Paternal Grandfather     brain  . Skin cancer Paternal Grandmother     Social History: History  Substance Use Topics  . Smoking status: Former Smoker    Types: Cigarettes    Quit date: 11/25/2001  . Smokeless tobacco: Never Used  . Alcohol Use: No    Allergies:  Allergies  Allergen Reactions  . Shellfish Allergy Hives  . Sulfa Antibiotics Dermatitis  . Other     Surgical glue causes a rash    Meds:  Prescriptions prior to admission  Medication Sig Dispense Refill Last Dose  . acetaminophen (TYLENOL) 500 MG tablet Take 500 mg by mouth daily as needed for headache.   06/11/2014 at Unknown time  . calcium carbonate (TUMS - DOSED IN MG ELEMENTAL CALCIUM) 500 MG chewable tablet Chew 1 tablet by mouth as needed for indigestion or heartburn.   06/11/2014 at Unknown time  . Prenatal Vit-Fe Fumarate-FA (PRENATAL MULTIVITAMIN) TABS tablet Take 1 tablet by mouth daily at 12 noon.   06/12/2014 at Unknown time  .  sertraline (ZOLOFT) 50 MG tablet Take 50 mg by mouth daily.   06/11/2014 at Unknown time  . doxylamine, Sleep, (UNISOM) 25 MG tablet Take 1 tablet (25 mg total) by mouth at bedtime as needed for sleep. (Patient not taking: Reported on 05/11/2014) 30 tablet 0 Not Taking  . ondansetron (ZOFRAN) 8 MG tablet Take 1 tablet (8 mg total) by mouth every 8 (eight) hours as needed for nausea or vomiting. (Patient not taking: Reported on 05/11/2014) 20 tablet 0 Not Taking    ROS:  Review of Systems  Constitutional: Negative for fever, chills and diaphoresis.  HENT: Negative for congestion and ear pain.   Eyes: Negative for blurred vision, double vision and photophobia.       Seeing "stars"  Respiratory: Negative for cough and shortness of breath.   Cardiovascular: Positive for palpitations. Negative for  chest pain and leg swelling.  Gastrointestinal: Positive for abdominal pain. Negative for nausea, vomiting, diarrhea and constipation.  Genitourinary: Negative for dysuria, urgency, frequency, hematuria and flank pain.       Negative for vaginal bleeding, vaginal discharge or leaking of fluid  Musculoskeletal: Positive for back pain (intermittent low back pain, with contractions). Negative for falls.  Neurological: Positive for dizziness. Negative for speech change, focal weakness, seizures, loss of consciousness, weakness and headaches.    Physical Exam  Blood pressure 116/75, pulse 80, temperature 98.2 F (36.8 C), temperature source Oral, resp. rate 18, last menstrual period 11/30/2013.  Patient Vitals for the past 24 hrs:  BP Temp Temp src Pulse Resp  06/12/14 1629 116/75 mmHg 98.2 F (36.8 C) Oral 80 18   GENERAL: Well-developed, well-nourished female in no acute distress. No pallor. Anxious. HEART: normal rate and rhythm. No murmurs rubs or gallops. RESP: normal effort. Clear to auscultation bilaterally. GI: Abd soft, non-tender, gravid appropriate for gestational age. Pos BS x 4 MS: Extremities nontender, no edema, normal ROM. Negative Homans. NEURO: Alert and oriented x 4.  GU: Normal external female genitalia. No blood. Physiologic discharge. No CVAT Dilation: Closed Effacement (%): Thick Exam by:: Marlou Porch, CNM   FHT:  Baseline 130 , moderate variability, accelerations present, no decelerations Contractions: UI   Labs: Results for orders placed or performed during the hospital encounter of 06/12/14 (from the past 24 hour(s))  Urinalysis, Routine w reflex microscopic     Status: None   Collection Time: 06/12/14  4:30 PM  Result Value Ref Range   Color, Urine YELLOW YELLOW   APPearance CLEAR CLEAR   Specific Gravity, Urine 1.010 1.005 - 1.030   pH 6.5 5.0 - 8.0   Glucose, UA NEGATIVE NEGATIVE mg/dL   Hgb urine dipstick NEGATIVE NEGATIVE   Bilirubin Urine NEGATIVE  NEGATIVE   Ketones, ur NEGATIVE NEGATIVE mg/dL   Protein, ur NEGATIVE NEGATIVE mg/dL   Urobilinogen, UA 0.2 0.0 - 1.0 mg/dL   Nitrite NEGATIVE NEGATIVE   Leukocytes, UA NEGATIVE NEGATIVE  CBC     Status: Abnormal   Collection Time: 06/12/14  5:33 PM  Result Value Ref Range   WBC 11.7 (H) 4.0 - 10.5 K/uL   RBC 3.78 (L) 3.87 - 5.11 MIL/uL   Hemoglobin 12.0 12.0 - 15.0 g/dL   HCT 34.4 (L) 36.0 - 46.0 %   MCV 91.0 78.0 - 100.0 fL   MCH 31.7 26.0 - 34.0 pg   MCHC 34.9 30.0 - 36.0 g/dL   RDW 13.2 11.5 - 15.5 %   Platelets 205 150 - 400 K/uL  Comprehensive metabolic panel  Status: Abnormal   Collection Time: 06/12/14  5:33 PM  Result Value Ref Range   Sodium 136 135 - 145 mmol/L   Potassium 3.6 3.5 - 5.1 mmol/L   Chloride 108 96 - 112 mmol/L   CO2 23 19 - 32 mmol/L   Glucose, Bld 87 70 - 99 mg/dL   BUN 8 6 - 23 mg/dL   Creatinine, Ser 0.56 0.50 - 1.10 mg/dL   Calcium 8.7 8.4 - 10.5 mg/dL   Total Protein 5.8 (L) 6.0 - 8.3 g/dL   Albumin 3.3 (L) 3.5 - 5.2 g/dL   AST 19 0 - 37 U/L   ALT 17 0 - 35 U/L   Alkaline Phosphatase 61 39 - 117 U/L   Total Bilirubin 0.2 (L) 0.3 - 1.2 mg/dL   GFR calc non Af Amer >90 >90 mL/min   GFR calc Af Amer >90 >90 mL/min   Anion gap 5 5 - 15  Wet prep, genital     Status: Abnormal   Collection Time: 06/12/14  5:39 PM  Result Value Ref Range   Yeast Wet Prep HPF POC NONE SEEN NONE SEEN   Trich, Wet Prep NONE SEEN NONE SEEN   Clue Cells Wet Prep HPF POC NONE SEEN NONE SEEN   WBC, Wet Prep HPF POC MODERATE (A) NONE SEEN    Imaging:  No results found.  MAU Course: CBC, CMP, UA, wet prep. Able to perform fetal fibronectin due to last intercourse less than 2 hours ago.  No contractions while in maternity admissions. Cervix closed and long. Patient with mild dizziness during MAU visit.  Discussed with Dr. Julien Girt. Plans outpatient cardiology referral.  Assessment: 1. Dizziness   2. Preterm contractions, second trimester     Plan: Discharge  home in stable condition per consult with Dr. Julien Girt.  Preterm precautions and fetal kick counts. Small frequent snacks. Stay well-hydrated. Patient cardiology referral. Follow up at [facility] for chest pain.  Follow-up Information    Follow up with TOMBLIN Marjean Donna, MD.   Specialty:  Obstetrics and Gynecology   Why:  As scheduled for routine prenatal visit   Contact information:   [place] [address] [phone]       Follow up with [facility].   Why:  As needed in emergencies   Contact information:   [address] [id number] [place] [phone]         Medication List    STOP taking these medications        doxylamine (Sleep) 25 MG tablet  Commonly known as:  UNISOM     ondansetron 8 MG tablet  Commonly known as:  ZOFRAN      TAKE these medications        acetaminophen 500 MG tablet  Commonly known as:  TYLENOL  Take 500 mg by mouth daily as needed for headache.     calcium carbonate 500 MG chewable tablet  Commonly known as:  TUMS - dosed in mg elemental calcium  Chew 1 tablet by mouth as needed for indigestion or heartburn.     prenatal multivitamin Tabs tablet  Take 1 tablet by mouth daily at 12 noon.     sertraline 50 MG tablet  Commonly known as:  ZOLOFT  Take 50 mg by mouth daily.       [place], [place] 06/12/2014 6:49 PM

## 2014-06-12 NOTE — MAU Note (Signed)
Intense uc's since Sunday, no more than 4/hour.  They wrap around the back, waking pt up at night.  Feels very lightheaded during contractions, seeing stars.  Denies HA, states BP has been normal.  Denies bleeding or LOF.  Baby has heart arrythmia & amniotic bands.

## 2014-06-12 NOTE — Discharge Instructions (Signed)
Preterm Labor Information Preterm labor is when labor starts at less than 37 weeks of pregnancy. The normal length of a pregnancy is 39 to 41 weeks. CAUSES Often, there is no identifiable underlying cause as to why a woman goes into preterm labor. One of the most common known causes of preterm labor is infection. Infections of the uterus, cervix, vagina, amniotic sac, bladder, kidney, or even the lungs (pneumonia) can cause labor to start. Other suspected causes of preterm labor include:   Urogenital infections, such as yeast infections and bacterial vaginosis.   Uterine abnormalities (uterine shape, uterine septum, fibroids, or bleeding from the placenta).   A cervix that has been operated on (it may fail to stay closed).   Malformations in the fetus.   Multiple gestations (twins, triplets, and so on).   Breakage of the amniotic sac.  RISK FACTORS  Having a previous history of preterm labor.   Having premature rupture of membranes (PROM).   Having a placenta that covers the opening of the cervix (placenta previa).   Having a placenta that separates from the uterus (placental abruption).   Having a cervix that is too weak to hold the fetus in the uterus (incompetent cervix).   Having too much fluid in the amniotic sac (polyhydramnios).   Taking illegal drugs or smoking while pregnant.   Not gaining enough weight while pregnant.   Being younger than 18 and older than 37 years old.   Having a low socioeconomic status.   Being African American. SYMPTOMS Signs and symptoms of preterm labor include:   Menstrual-like cramps, abdominal pain, or back pain.  Uterine contractions that are regular, as frequent as six in an hour, regardless of their intensity (may be mild or painful).  Contractions that start on the top of the uterus and spread down to the lower abdomen and back.   A sense of increased pelvic pressure.   A watery or bloody mucus discharge that  comes from the vagina.  TREATMENT Depending on the length of the pregnancy and other circumstances, your health care provider may suggest bed rest. If necessary, there are medicines that can be given to stop contractions and to mature the fetal lungs. If labor happens before 34 weeks of pregnancy, a prolonged hospital stay may be recommended. Treatment depends on the condition of both you and the fetus.  WHAT SHOULD YOU DO IF YOU THINK YOU ARE IN PRETERM LABOR? Call your health care provider right away. You will need to go to the hospital to get checked immediately. HOW CAN YOU PREVENT PRETERM LABOR IN FUTURE PREGNANCIES? You should:   Stop smoking if you smoke.  Maintain healthy weight gain and avoid chemicals and drugs that are not necessary.  Be watchful for any type of infection.  Inform your health care provider if you have a known history of preterm labor. Document Released: 04/25/2003 Document Revised: 10/05/2012 Document Reviewed: 03/07/2012 [facility] Patient Information 2015 [place], [place]. This information is not intended to replace advice given to you by your health care provider. Make sure you discuss any questions you have with your health care provider.   Dizziness Dizziness is a common problem. It is a feeling of unsteadiness or light-headedness. You may feel like you are about to faint. Dizziness can lead to injury if you stumble or fall. A person of any age group can suffer from dizziness, but dizziness is more common in older adults. CAUSES  Dizziness can be caused by many different things, including:  Middle ear problems.  Standing for too long.  Infections.  An allergic reaction.  Aging.  An emotional response to something, such as the sight of blood.  Side effects of medicines.  Tiredness.  Problems with circulation or blood pressure.  Excessive use of alcohol or medicines, or illegal drug use.  Breathing too fast (hyperventilation).  An irregular  heart rhythm (arrhythmia).  A low red blood cell count (anemia).  Pregnancy.  Vomiting, diarrhea, fever, or other illnesses that cause body fluid loss (dehydration).  Diseases or conditions such as Parkinson's disease, high blood pressure (hypertension), diabetes, and thyroid problems.  Exposure to extreme heat. DIAGNOSIS  Your health care provider will ask about your symptoms, perform a physical exam, and perform an electrocardiogram (ECG) to record the electrical activity of your heart. Your health care provider may also perform other heart or blood tests to determine the cause of your dizziness. These may include:  Transthoracic echocardiogram (TTE). During echocardiography, sound waves are used to evaluate how blood flows through your heart.  Transesophageal echocardiogram (TEE).  Cardiac monitoring. This allows your health care provider to monitor your heart rate and rhythm in real time.  Holter monitor. This is a portable device that records your heartbeat and can help diagnose heart arrhythmias. It allows your health care provider to track your heart activity for several days if needed.  Stress tests by exercise or by giving medicine that makes the heart beat faster. TREATMENT  Treatment of dizziness depends on the cause of your symptoms and can vary greatly. HOME CARE INSTRUCTIONS   Drink enough fluids to keep your urine clear or pale yellow. This is especially important in very hot weather. In older adults, it is also important in cold weather.  Take your medicine exactly as directed if your dizziness is caused by medicines. When taking blood pressure medicines, it is especially important to get up slowly.  Rise slowly from chairs and steady yourself until you feel okay.  In the morning, first sit up on the side of the bed. When you feel okay, stand slowly while holding onto something until you know your balance is fine.  Move your legs often if you need to stand in one  place for a long time. Tighten and relax your muscles in your legs while standing.  Have someone stay with you for 1-2 days if dizziness continues to be a problem. Do this until you feel you are well enough to stay alone. Have the person call your health care provider if he or she notices changes in you that are concerning.  Do not drive or use heavy machinery if you feel dizzy.  Do not drink alcohol. SEEK IMMEDIATE MEDICAL CARE IF:   Your dizziness or light-headedness gets worse.  You feel nauseous or vomit.  You have problems talking, walking, or using your arms, hands, or legs.  You feel weak.  You are not thinking clearly or you have trouble forming sentences. It may take a friend or family member to notice this.  You have chest pain, abdominal pain, shortness of breath, or sweating.  Your vision changes.  You notice any bleeding.  You have side effects from medicine that seems to be getting worse rather than better. MAKE SURE YOU:   Understand these instructions.  Will watch your condition.  Will get help right away if you are not doing well or get worse. Document Released: 07/29/2000 Document Revised: 02/07/2013 Document Reviewed: 08/22/2010 [facility] Patient Information 2015 [place], [place]. This  information is not intended to replace advice given to you by your health care provider. Make sure you discuss any questions you have with your health care provider.    Cardiac Event Monitoring A cardiac event monitor is a small recording device used to help detect abnormal heart rhythms (arrhythmias). The monitor is used to record heart rhythm when noticeable symptoms such as the following occur:  Fast heartbeats (palpitations), such as heart racing or fluttering.  Dizziness.  Fainting or light-headedness.  Unexplained weakness. The monitor is wired to two electrodes placed on your chest. Electrodes are flat, sticky disks that attach to your skin. The monitor can be  worn for up to 30 days. You will wear the monitor at all times, except when bathing.  HOW TO USE YOUR CARDIAC EVENT MONITOR A technician will prepare your chest for the electrode placement. The technician will show you how to place the electrodes, how to work the monitor, and how to replace the batteries. Take time to practice using the monitor before you leave the office. Make sure you understand how to send the information from the monitor to your health care provider. This requires a telephone with a landline, not a cell phone. You need to:  Wear your monitor at all times, except when you are in water:  Do not get the monitor wet.  Take the monitor off when bathing. Do not swim or use a hot tub with it on.  Keep your skin clean. Do not put body lotion or moisturizer on your chest.  Change the electrodes daily or any time they stop sticking to your skin. You might need to use tape to keep them on.  It is possible that your skin under the electrodes could become irritated. To keep this from happening, try to put the electrodes in slightly different places on your chest. However, they must remain in the area under your left breast and in the upper right section of your chest.  Make sure the monitor is safely clipped to your clothing or in a location close to your body that your health care provider recommends.  Press the button to record when you feel symptoms of heart trouble, such as dizziness, weakness, light-headedness, palpitations, thumping, shortness of breath, unexplained weakness, or a fluttering or racing heart. The monitor is always on and records what happened slightly before you pressed the button, so do not worry about being too late to get good information.  Keep a diary of your activities, such as walking, doing chores, and taking medicine. It is especially important to note what you were doing when you pushed the button to record your symptoms. This will help your health care  provider determine what might be contributing to your symptoms. The information stored in your monitor will be reviewed by your health care provider alongside your diary entries.  Send the recorded information as recommended by your health care provider. It is important to understand that it will take some time for your health care provider to process the results.  Change the batteries as recommended by your health care provider. SEEK IMMEDIATE MEDICAL CARE IF:   You have chest pain.  You have extreme difficulty breathing or shortness of breath.  You develop a very fast heartbeat that persists.  You develop dizziness that does not go away.  You faint or constantly feel you are about to faint. Document Released: 11/12/2007 Document Revised: 06/19/2013 Document Reviewed: 08/01/2012 [facility] Patient Information 2015 [place], [place]. This information is  not intended to replace advice given to you by your health care provider. Make sure you discuss any questions you have with your health care provider.

## 2014-06-22 ENCOUNTER — Telehealth: Payer: Self-pay | Admitting: Neurology

## 2014-06-22 NOTE — Telephone Encounter (Signed)
Patient calling for home sleep study results

## 2014-06-26 NOTE — Telephone Encounter (Signed)
Please add this patient to home sleep studies as soon as available.

## 2014-07-09 ENCOUNTER — Encounter: Payer: Self-pay | Admitting: Nurse Practitioner

## 2014-07-09 ENCOUNTER — Ambulatory Visit (INDEPENDENT_AMBULATORY_CARE_PROVIDER_SITE_OTHER): Payer: BLUE CROSS/BLUE SHIELD | Admitting: Nurse Practitioner

## 2014-07-09 VITALS — BP 118/66 | HR 85 | Resp 16 | Ht 69.0 in | Wt 189.4 lb

## 2014-07-09 DIAGNOSIS — O26892 Other specified pregnancy related conditions, second trimester: Secondary | ICD-10-CM

## 2014-07-09 DIAGNOSIS — R51 Headache: Secondary | ICD-10-CM

## 2014-07-09 NOTE — Patient Instructions (Signed)
May try Unisom over-the-counter to help with sleep Home sleep study was negative for obstructive sleep apnea Follow-up after delivery to be placed on preventive medication

## 2014-07-09 NOTE — Progress Notes (Signed)
I agree with the assessment and plan as directed by NP .The patient is known to me .   Lennart Gladish, MD  

## 2014-07-09 NOTE — Progress Notes (Signed)
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 This patient is currently [redacted] weeks pregnant, in her third pregnancy. 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 Most recently she has noticed that the migraines awaken her from sleep with   severe associated with nausea, vomiting, the right side of her face with a throbbing quality above and behind the eye. The left face goes numb at the time the right face hurts.  She describes a visual aura with the exact lines and flashing lights. She also has  some vertigo. She feels that the headaches take all her strengths out of her and less productive and certainly less functional. The spells last a couple of hours before the pain subsides. But there is a latent headache that can last days in the background. She has palpitations and a shortness of breath at night, too. She sleeps on 2-3 pillows. She only gets about 2-3 hours sleep at time. She has been prescribed Ambien by her OB but that makes her too drowsy during the day. She had a home sleep study that was negative for obstructive sleep apnea lowest O2 content was 92%, Dr. [name] reviewed  those results with her. She returns for reevaluation   With her second pregnancy at age 34 she had the first time encountered any migrainous headaches. She had other forms of headaches before and sometimes intensely painful headaches but not migrainous.   REVIEW OF SYSTEMS: Full 14 system review of systems performed and notable only for those listed, all others are neg:  Constitutional: Fatigue Cardiovascular: Leg swelling  Ear/Nose/Throat: neg  Skin: neg Eyes: Blurred  vision, light sensitivity Respiratory: neg Gastroitestinal: neg  Hematology/Lymphatic: neg  Endocrine: neg Musculoskeletal:neg Allergy/Immunology: neg Neurological: Headache , dizziness Psychiatric: Anxiety decreased concentration Sleep : Insomnia, daytime sleepiness   ALLERGIES: Allergies  Allergen Reactions  . Shellfish Allergy Hives  . Sulfa Antibiotics Dermatitis  . Other     Surgical glue causes a rash    HOME MEDICATIONS: Outpatient Prescriptions Prior to Visit  Medication Sig Dispense Refill  . acetaminophen (TYLENOL) 500 MG tablet Take 500 mg by mouth daily as needed for headache.    . Prenatal Vit-Fe Fumarate-FA (PRENATAL MULTIVITAMIN) TABS tablet Take 1 tablet by mouth daily at 12 noon.    . sertraline (ZOLOFT) 50 MG tablet Take 50 mg by mouth daily.    . calcium carbonate (TUMS - DOSED IN MG ELEMENTAL CALCIUM) 500 MG chewable tablet Chew 1 tablet by mouth as needed for indigestion or heartburn.     No facility-administered medications prior to visit.    PAST MEDICAL HISTORY: Past Medical History  Diagnosis Date  . Headache   . Endometriosis 1997  . Otosclerosis 2008  . Chiari malformation type I     MRI [facility] 2013  . Anxiety   . Headache in pregnancy     PAST SURGICAL HISTORY: Past Surgical History  Procedure Laterality Date  . Wisdom tooth extraction  2008  . Shoulder surgery  2000  . Tonsillectomy and adenoidectomy  2010  . Laparoscopic abdominal exploration  06/2013    has had 4 surgeries  . Dilation and curettage of uterus  May 2015  FAMILY HISTORY: Family History  Problem Relation Age of Onset  . Asthma Father   . Cancer Maternal Grandmother     skin  . Hypertension Other   . Hyperlipidemia Other   . Stroke Other   . Obesity Other   . Arthritis Father     Rheumatoid  . Aneurysm Paternal Grandfather     brain  . Skin cancer Paternal Grandmother     SOCIAL HISTORY: History   Social History  . Marital Status: Married    Spouse  Name: N/A  . Number of Children: 2  . 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 Smoking status: Former Smoker    Types: Cigarettes    Quit date: 11/25/2001  . Smokeless tobacco: Never Used  . Alcohol Use: No  . Drug Use: No  . Sexual Activity: Yes    Birth Control/ Protection: None   Other Topics Concern  . Not on file   Social History Narrative   Caffeine none (occasional soda).     PHYSICAL EXAM  Filed Vitals:   07/09/14 1004  BP: 118/66  Pulse: 85  Resp: 16  Height: 5\' 9"  (1.753 m)  Weight: 189 lb 6.4 oz (85.911 kg)   Body mass index is 27.96 kg/(m^2). General: The patient is awake, alert and appears not in acute distress. The patient is well groomed. Head: Normocephalic, atraumatic.  Neck is supple. Mallampati 2 , neck circumference: 14  Skin: Without evidence of edema, or rash Trunk: BMI is elevated and patient has normal posture.  Neurologic exam : The patient is awake and alert, oriented to place and time. Memory subjective described as intact. There is a normal attention span & concentration ability. Speech is fluent without dysarthria, dysphonia or aphasia. Mood and affect are appropriate.ESS 19.   Cranial nerves: Pupils are equal and briskly reactive to light. Funduscopic exam without evidence of pallor or edema. Extraocular movements in vertical and horizontal planes intact and without nystagmus.  Visual fields by finger perimetry are intact. I have noted a slight lower eyelid tic on the right side, there is no asymmetric flushing no rash or puffiness. Hearing to finger rub intact. Facial sensation intact to fine touch. Facial motor strength is symmetric and tongue and uvula move midline. Tongue protrusion into either cheek is normal.  Shoulder shrug is normal.  Motor exam: Normal tone, muscle bulk and symmetric strength in all extremities. Coordination: Rapid alternating  movements in the fingers/hands were normal. Finger-to-nose maneuver normal without evidence of ataxia, dysmetria or tremor. Gait and station: Strength within normal limits. Stance is stable and normal. Tandem gait is unfragmented. Romberg testing is negative  Deep tendon reflexes: in the upper and lower extremities are symmetric and intact. Babinski maneuver response is downgoing.   DIAGNOSTIC DATA (LABS, IMAGING, TESTING) - I reviewed patient records, labs, notes, testing and imaging myself where available.  Lab Results  Component Value Date   WBC 11.7* 06/12/2014   HGB 12.0 06/12/2014   HCT 34.4* 06/12/2014   MCV 91.0 06/12/2014   PLT 205 06/12/2014      Component Value Date/Time   NA 136 06/12/2014 1733   K 3.6 06/12/2014 1733   CL 108 06/12/2014 1733   CO2 23 06/12/2014 1733   GLUCOSE 87 06/12/2014 1733   BUN 8 06/12/2014 1733   CREATININE 0.56 06/12/2014 1733   CALCIUM 8.7 06/12/2014 1733  PROT 5.8* 06/12/2014 1733   ALBUMIN 3.3* 06/12/2014 1733   AST 19 06/12/2014 1733   ALT 17 06/12/2014 1733   ALKPHOS 61 06/12/2014 1733   BILITOT 0.2* 06/12/2014 1733   GFRNONAA >90 06/12/2014 1733   GFRAA >90 06/12/2014 1733   ASSESSMENT AND PLAN  39 y.o. year old female  has a past medical history of Headache; Endometriosis (1997); Otosclerosis (2008); Chiari malformation type I; Anxiety; and Headache in pregnancy. here to follow-up. Her home sleep study was negative for obstructive sleep apnea. Dr. [name] reviewed those results with her.  May try Unisom to help with sleep over-the-counter Follow-up after delivery to be placed on preventive medication Nortriptyline as worked well in the past Dennie Bible, [facility], [facility], [facility] [address] [place], [place] [address] [phone]

## 2014-07-14 ENCOUNTER — Inpatient Hospital Stay (HOSPITAL_COMMUNITY)
Admission: AD | Admit: 2014-07-14 | Discharge: 2014-07-14 | Disposition: A | Discharge status: Home / Self Care | Payer: BLUE CROSS/BLUE SHIELD | Source: Ambulatory Visit | Attending: Obstetrics and Gynecology | Admitting: Obstetrics and Gynecology

## 2014-07-14 ENCOUNTER — Encounter (HOSPITAL_COMMUNITY): Payer: Self-pay | Admitting: *Deleted

## 2014-07-14 DIAGNOSIS — O09523 Supervision of elderly multigravida, third trimester: Secondary | ICD-10-CM | POA: Insufficient documentation

## 2014-07-14 DIAGNOSIS — G935 Compression of brain: Secondary | ICD-10-CM | POA: Diagnosis not present

## 2014-07-14 DIAGNOSIS — Z882 Allergy status to sulfonamides status: Secondary | ICD-10-CM | POA: Insufficient documentation

## 2014-07-14 DIAGNOSIS — Z79899 Other long term (current) drug therapy: Secondary | ICD-10-CM | POA: Diagnosis not present

## 2014-07-14 DIAGNOSIS — O99353 Diseases of the nervous system complicating pregnancy, third trimester: Secondary | ICD-10-CM | POA: Diagnosis not present

## 2014-07-14 DIAGNOSIS — Z87891 Personal history of nicotine dependence: Secondary | ICD-10-CM | POA: Insufficient documentation

## 2014-07-14 DIAGNOSIS — R109 Unspecified abdominal pain: Secondary | ICD-10-CM | POA: Diagnosis present

## 2014-07-14 DIAGNOSIS — O4703 False labor before 37 completed weeks of gestation, third trimester: Secondary | ICD-10-CM

## 2014-07-14 DIAGNOSIS — Z3A32 32 weeks gestation of pregnancy: Secondary | ICD-10-CM | POA: Insufficient documentation

## 2014-07-14 LAB — FETAL FIBRONECTIN: Fetal Fibronectin: NEGATIVE

## 2014-07-14 LAB — URINALYSIS, ROUTINE W REFLEX MICROSCOPIC
BILIRUBIN URINE: NEGATIVE
Glucose, UA: NEGATIVE mg/dL
Hgb urine dipstick: NEGATIVE
Ketones, ur: NEGATIVE mg/dL
Leukocytes, UA: NEGATIVE
Nitrite: NEGATIVE
Protein, ur: NEGATIVE mg/dL
SPECIFIC GRAVITY, URINE: 1.01 (ref 1.005–1.030)
Urobilinogen, UA: 0.2 mg/dL (ref 0.0–1.0)
pH: 6.5 (ref 5.0–8.0)

## 2014-07-14 LAB — WET PREP, GENITAL
CLUE CELLS WET PREP: NONE SEEN
Trich, Wet Prep: NONE SEEN
Yeast Wet Prep HPF POC: NONE SEEN

## 2014-07-14 LAB — OB RESULTS CONSOLE GC/CHLAMYDIA: Gonorrhea: NEGATIVE

## 2014-07-14 MED ORDER — NIFEDIPINE 10 MG PO CAPS
10.0000 mg | ORAL_CAPSULE | Freq: Four times a day (QID) | ORAL | Status: DC | PRN
Start: 2014-07-14 — End: 2014-08-29

## 2014-07-14 MED ORDER — NIFEDIPINE 10 MG PO CAPS
10.0000 mg | ORAL_CAPSULE | ORAL | Status: DC | PRN
  Administered 2014-07-14 (×3): 10 mg via ORAL
  Filled 2014-07-14 (×3): qty 1

## 2014-07-14 NOTE — MAU Provider Note (Signed)
Chief Complaint:  Abdominal Cramping  First Provider Initiated Contact with Patient 07/14/14 2024     HPI: Ellen Vasquez is a 39 y.o. G3P2002 at [redacted]w[redacted]d who presents to maternity admissions reporting low abd cramping 7/10 on pain scale since 1800 this evening. Cramping started after being at a soccer game outside this afternoon. No improvement w/ PO hydration. Describes cramping as constant, menstrual-like cramps w/ intermittent worsening of cramping. Feels similar to [employer] contractions that she has been experiencing for several weeks, but worse. No problems w/ PTL this pregnancy. Last IC > 24 hours ago.  Denies leakage of fluid or vaginal bleeding. Good fetal movement.   Past Medical History: Past Medical History  Diagnosis Date  . Headache   . Endometriosis 1997  . Otosclerosis 2008  . Chiari malformation type I     MRI [facility] 2013  . Anxiety   . Headache in pregnancy     Past obstetric history: OB History  Gravida Para Term Preterm AB SAB TAB Ectopic Multiple Living  3 2 2       2     # Outcome Date GA Lbr Len/2nd Weight Sex Delivery Anes PTL Lv  3 Current           2 Term 08/23/07 [redacted]w[redacted]d   F Vag-Spont EPI Y Y  1 Term 10/21/04 [redacted]w[redacted]d   F Vag-Spont EPI N Y      Past Surgical History: Past Surgical History  Procedure Laterality Date  . Wisdom tooth extraction  2008  . Shoulder surgery  2000  . Tonsillectomy and adenoidectomy  2010  . Laparoscopic abdominal exploration  06/2013    has had 4 surgeries  . Dilation and curettage of uterus  May 2015     Family History: Family History  Problem Relation Age of Onset  . Asthma Father   . Cancer Maternal Grandmother     skin  . Hypertension Other   . Hyperlipidemia Other   . Stroke Other   . Obesity Other   . Arthritis Father     Rheumatoid  . Aneurysm Paternal Grandfather     brain  . Skin cancer Paternal Grandmother     Social History: History  Substance Use Topics  . Smoking status: Former Smoker   Types: Cigarettes    Quit date: 11/25/2001  . Smokeless tobacco: Never Used  . Alcohol Use: No    Allergies:  Allergies  Allergen Reactions  . Shellfish Allergy Hives  . Sulfa Antibiotics Dermatitis  . Other     Surgical glue causes a rash    Meds:  Prescriptions prior to admission  Medication Sig Dispense Refill Last Dose  . acetaminophen (TYLENOL) 500 MG tablet Take 500 mg by mouth daily as needed for headache.   Taking  . cephALEXin (KEFLEX) 500 MG capsule Take 500 mg by mouth 2 (two) times daily.  0 Taking  . cyclobenzaprine (FLEXERIL) 10 MG tablet Take 10 mg by mouth every 8 (eight) hours as needed.  0 Taking  . ondansetron (ZOFRAN) 8 MG tablet 8 mg every 8 (eight) hours as needed.   0 Taking  . oxyCODONE-acetaminophen (PERCOCET/ROXICET) 5-325 MG per tablet Take 1 tablet by mouth every 4 (four) hours as needed. for pain  0 Taking  . Prenatal Vit-Fe Fumarate-FA (PRENATAL MULTIVITAMIN) TABS tablet Take 1 tablet by mouth daily at 12 noon.   Taking  . sertraline (ZOLOFT) 50 MG tablet Take 50 mg by mouth daily.   Taking  . zolpidem (  AMBIEN) 5 MG tablet Take 5 mg by mouth at bedtime as needed.  0 Not Taking    ROS:  Review of Systems  Constitutional: Positive for chills and diaphoresis (this afternoon after soccer game. None since.). Negative for fever.  Cardiovascular: Negative for chest pain and palpitations.  Gastrointestinal: Positive for abdominal pain. Negative for nausea, vomiting, diarrhea and constipation.  Genitourinary: Negative for dysuria, urgency, frequency, hematuria and flank pain.       Positive for increased this, white vaginal discharge. Neg for vaginal bleeding, LOF, vaginal odor or vaginal itching.     Physical Exam  Blood pressure 126/70, pulse 74, temperature 97.4 F (36.3 C), temperature source Oral, resp. rate 16, last menstrual period 11/30/2013. GENERAL: Well-developed, well-nourished female in no acute distress.  HEART: normal rate RESP: normal  effort GI: Abd soft, non-tender, gravid appropriate for gestational age.  MS: Extremities nontender, no edema, normal ROM NEURO: Alert and oriented x 4.  GU: NEFG, moderate amount if thin, white, odorless discharge, no blood, cervix clean. No CVAT Dilation: Closed Effacement (%): Thick Cervical Position: Posterior Station: Ballotable Presentation: Undeterminable Exam by:: Manya Silvas, CNM  FHT:  Baseline 125 , moderate variability, accelerations present, no decelerations Contractions: q 2-9 mins, mild   Labs: Results for orders placed or performed during the hospital encounter of 07/14/14 (from the past 24 hour(s))  Urinalysis, Routine w reflex microscopic (not at [facility])     Status: None   Collection Time: 07/14/14  7:52 PM  Result Value Ref Range   Color, Urine YELLOW YELLOW   APPearance CLEAR CLEAR   Specific Gravity, Urine 1.010 1.005 - 1.030   pH 6.5 5.0 - 8.0   Glucose, UA NEGATIVE NEGATIVE mg/dL   Hgb urine dipstick NEGATIVE NEGATIVE   Bilirubin Urine NEGATIVE NEGATIVE   Ketones, ur NEGATIVE NEGATIVE mg/dL   Protein, ur NEGATIVE NEGATIVE mg/dL   Urobilinogen, UA 0.2 0.0 - 1.0 mg/dL   Nitrite NEGATIVE NEGATIVE   Leukocytes, UA NEGATIVE NEGATIVE  Wet prep, genital     Status: Abnormal   Collection Time: 07/14/14  8:35 PM  Result Value Ref Range   Yeast Wet Prep HPF POC NONE SEEN NONE SEEN   Trich, Wet Prep NONE SEEN NONE SEEN   Clue Cells Wet Prep HPF POC NONE SEEN NONE SEEN   WBC, Wet Prep HPF POC FEW (A) NONE SEEN  Fetal fibronectin     Status: None   Collection Time: 07/14/14  8:35 PM  Result Value Ref Range   Fetal Fibronectin NEGATIVE NEGATIVE    Imaging:  No results found.  MAU Course: FFN, Wet  Prep, GC/Chlamydia, Push PO fluids, Procardia.  Contractions resolved. Feeling much better. Very anxious about contractions returning. Discussed neg fFN and long, closed cervix very reassuring. Pt requesting procardia for home. Discussed Sx, exam w/ Dr. Matthew Saras.  Pt may have few procardia for comfort.  Assessment: 1. Preterm contractions, third trimester    Plan: Discharge home in stable condition.  Preterm labor precautions and fetal kick counts   Follow-up Information    Follow up with Margarette Asal, MD On 07/18/2014.   Specialty:  Obstetrics and Gynecology   Why:  Routine prenatal visit or sooner as needed if symptoms worsen   Contact information:   [place] [address] [phone]       Follow up with Sam Rayburn.   Why:  As needed in emergencies   Contact information:  [address] [id number] [place] [phone]        Medication List    STOP taking these medications        cephALEXin 500 MG capsule  Commonly known as:  KEFLEX     ondansetron 8 MG tablet  Commonly known as:  ZOFRAN     oxyCODONE-acetaminophen 5-325 MG per tablet  Commonly known as:  PERCOCET/ROXICET     zolpidem 5 MG tablet  Commonly known as:  AMBIEN      TAKE these medications        acetaminophen 500 MG tablet  Commonly known as:  TYLENOL  Take 500 mg by mouth daily as needed for headache.     cyclobenzaprine 10 MG tablet  Commonly known as:  FLEXERIL  Take 10 mg by mouth every 8 (eight) hours as needed for muscle spasms.     diphenhydrAMINE 12.5 MG chewable tablet  Commonly known as:  BENADRYL  Chew 12.5 mg by mouth 4 (four) times daily as needed for allergies.     hydrocortisone cream 1 %  Apply 1 application topically 2 (two) times daily as needed for itching (rash).     NIFEdipine 10 MG capsule  Commonly known as:  PROCARDIA  Take 1 capsule (10 mg total) by mouth every 6 (six) hours as needed (for greater than 5 contractions per hour).     prenatal multivitamin Tabs tablet  Take 1 tablet by mouth daily at 12 noon.     ranitidine 150 MG tablet  Commonly known as:  ZANTAC  Take 150 mg by mouth 2 (two) times  daily.     sertraline 50 MG tablet  Commonly known as:  ZOLOFT  Take 50 mg by mouth daily.       [place], CNM 07/14/2014 8:10 PM

## 2014-07-14 NOTE — MAU Note (Signed)
Patient states that cramping started around 6pm this evening and feels like constant period like cramping.  Denies LOF or vaginal bleeding.  States baby moving well.

## 2014-07-14 NOTE — Discharge Instructions (Signed)
Braxton Hicks Contractions Contractions of the uterus can occur throughout pregnancy. Contractions are not always a sign that you are in labor.  WHAT ARE BRAXTON HICKS CONTRACTIONS?  Contractions that occur before labor are called Braxton Hicks contractions, or false labor. Toward the end of pregnancy (32-34 weeks), these contractions can develop more often and may become more forceful. This is not true labor because these contractions do not result in opening (dilatation) and thinning of the cervix. They are sometimes difficult to tell apart from true labor because these contractions can be forceful and people have different pain tolerances. You should not feel embarrassed if you go to the hospital with false labor. Sometimes, the only way to tell if you are in true labor is for your health care provider to look for changes in the cervix. If there are no prenatal problems or other health problems associated with the pregnancy, it is completely safe to be sent home with false labor and await the onset of true labor. HOW CAN YOU TELL THE DIFFERENCE BETWEEN TRUE AND FALSE LABOR? False Labor  The contractions of false labor are usually shorter and not as hard as those of true labor.   The contractions are usually irregular.   The contractions are often felt in the front of the lower abdomen and in the groin.   The contractions may go away when you walk around or change positions while lying down.   The contractions get weaker and are shorter lasting as time goes on.   The contractions do not usually become progressively stronger, regular, and closer together as with true labor.  True Labor  Contractions in true labor last 30-70 seconds, become very regular, usually become more intense, and increase in frequency.   The contractions do not go away with walking.   The discomfort is usually felt in the top of the uterus and spreads to the lower abdomen and low back.   True labor can be  determined by your health care provider with an exam. This will show that the cervix is dilating and getting thinner.  WHAT TO REMEMBER  Keep up with your usual exercises and follow other instructions given by your health care provider.   Take medicines as directed by your health care provider.   Keep your regular prenatal appointments.   Eat and drink lightly if you think you are going into labor.   If Braxton Hicks contractions are making you uncomfortable:   Change your position from lying down or resting to walking, or from walking to resting.   Sit and rest in a tub of warm water.   Drink 2-3 glasses of water. Dehydration may cause these contractions.   Do slow and deep breathing several times an hour.  WHEN SHOULD I SEEK IMMEDIATE MEDICAL CARE? Seek immediate medical care if:  Your contractions become stronger, more regular, and closer together.   You have fluid leaking or gushing from your vagina.   You have a fever.   You pass blood-tinged mucus.   You have vaginal bleeding.   You have continuous abdominal pain.   You have low back pain that you never had before.   You feel your baby's head pushing down and causing pelvic pressure.   Your baby is not moving as much as it used to.  Document Released: 02/02/2005 Document Revised: 02/07/2013 Document Reviewed: 11/14/2012 [facility] Patient Information 2015 [place], [place]. This information is not intended to replace advice given to you by your health care  provider. Make sure you discuss any questions you have with your health care provider.   Preterm Labor Information Preterm labor is when labor starts at less than 37 weeks of pregnancy. The normal length of a pregnancy is 39 to 41 weeks. CAUSES Often, there is no identifiable underlying cause as to why a woman goes into preterm labor. One of the most common known causes of preterm labor is infection. Infections of the uterus, cervix, vagina,  amniotic sac, bladder, kidney, or even the lungs (pneumonia) can cause labor to start. Other suspected causes of preterm labor include:   Urogenital infections, such as yeast infections and bacterial vaginosis.   Uterine abnormalities (uterine shape, uterine septum, fibroids, or bleeding from the placenta).   A cervix that has been operated on (it may fail to stay closed).   Malformations in the fetus.   Multiple gestations (twins, triplets, and so on).   Breakage of the amniotic sac.  RISK FACTORS  Having a previous history of preterm labor.   Having premature rupture of membranes (PROM).   Having a placenta that covers the opening of the cervix (placenta previa).   Having a placenta that separates from the uterus (placental abruption).   Having a cervix that is too weak to hold the fetus in the uterus (incompetent cervix).   Having too much fluid in the amniotic sac (polyhydramnios).   Taking illegal drugs or smoking while pregnant.   Not gaining enough weight while pregnant.   Being younger than 20 and older than 37 years old.   Having a low socioeconomic status.   Being African American. SYMPTOMS Signs and symptoms of preterm labor include:   Menstrual-like cramps, abdominal pain, or back pain.  Uterine contractions that are regular, as frequent as six in an hour, regardless of their intensity (may be mild or painful).  Contractions that start on the top of the uterus and spread down to the lower abdomen and back.   A sense of increased pelvic pressure.   A watery or bloody mucus discharge that comes from the vagina.  TREATMENT Depending on the length of the pregnancy and other circumstances, your health care provider may suggest bed rest. If necessary, there are medicines that can be given to stop contractions and to mature the fetal lungs. If labor happens before 34 weeks of pregnancy, a prolonged hospital stay may be recommended. Treatment  depends on the condition of both you and the fetus.  WHAT SHOULD YOU DO IF YOU THINK YOU ARE IN PRETERM LABOR? Call your health care provider right away. You will need to go to the hospital to get checked immediately. HOW CAN YOU PREVENT PRETERM LABOR IN FUTURE PREGNANCIES? You should:   Stop smoking if you smoke.  Maintain healthy weight gain and avoid chemicals and drugs that are not necessary.  Be watchful for any type of infection.  Inform your health care provider if you have a known history of preterm labor. Document Released: 04/25/2003 Document Revised: 10/05/2012 Document Reviewed: 03/07/2012 [facility] Patient Information 2015 [place], [place]. This information is not intended to replace advice given to you by your health care provider. Make sure you discuss any questions you have with your health care provider.

## 2014-07-17 LAB — GC/CHLAMYDIA PROBE AMP (~~LOC~~) NOT AT ARMC
Chlamydia: NEGATIVE
Neisseria Gonorrhea: NEGATIVE

## 2014-08-13 LAB — OB RESULTS CONSOLE GBS: GBS: NEGATIVE

## 2014-08-25 ENCOUNTER — Inpatient Hospital Stay (HOSPITAL_COMMUNITY)
Admission: AD | Admit: 2014-08-25 | Discharge: 2014-08-25 | Disposition: A | Discharge status: Home / Self Care | Payer: BLUE CROSS/BLUE SHIELD | Source: Ambulatory Visit | Attending: Obstetrics and Gynecology | Admitting: Obstetrics and Gynecology

## 2014-08-25 ENCOUNTER — Encounter (HOSPITAL_COMMUNITY): Payer: Self-pay

## 2014-08-25 DIAGNOSIS — O9989 Other specified diseases and conditions complicating pregnancy, childbirth and the puerperium: Secondary | ICD-10-CM | POA: Diagnosis not present

## 2014-08-25 DIAGNOSIS — R51 Headache: Secondary | ICD-10-CM | POA: Diagnosis not present

## 2014-08-25 DIAGNOSIS — Z3A38 38 weeks gestation of pregnancy: Secondary | ICD-10-CM | POA: Diagnosis not present

## 2014-08-25 DIAGNOSIS — Z87891 Personal history of nicotine dependence: Secondary | ICD-10-CM | POA: Insufficient documentation

## 2014-08-25 DIAGNOSIS — R519 Headache, unspecified: Secondary | ICD-10-CM

## 2014-08-25 LAB — URINALYSIS, ROUTINE W REFLEX MICROSCOPIC
Bilirubin Urine: NEGATIVE
GLUCOSE, UA: NEGATIVE mg/dL
HGB URINE DIPSTICK: NEGATIVE
Ketones, ur: NEGATIVE mg/dL
Leukocytes, UA: NEGATIVE
Nitrite: NEGATIVE
PH: 7 (ref 5.0–8.0)
Protein, ur: NEGATIVE mg/dL
Urobilinogen, UA: 0.2 mg/dL (ref 0.0–1.0)

## 2014-08-25 LAB — COMPREHENSIVE METABOLIC PANEL
ALK PHOS: 128 U/L — AB (ref 38–126)
ALT: 12 U/L — AB (ref 14–54)
AST: 19 U/L (ref 15–41)
Albumin: 3.1 g/dL — ABNORMAL LOW (ref 3.5–5.0)
Anion gap: 4 — ABNORMAL LOW (ref 5–15)
BUN: 6 mg/dL (ref 6–20)
CO2: 23 mmol/L (ref 22–32)
Calcium: 8.3 mg/dL — ABNORMAL LOW (ref 8.9–10.3)
Chloride: 106 mmol/L (ref 101–111)
Creatinine, Ser: 0.71 mg/dL (ref 0.44–1.00)
GFR calc Af Amer: >60 mL/min (ref >60)
Glucose, Bld: 115 mg/dL — ABNORMAL HIGH (ref 65–99)
POTASSIUM: 3.3 mmol/L — AB (ref 3.5–5.1)
SODIUM: 133 mmol/L — AB (ref 135–145)
TOTAL PROTEIN: 5.5 g/dL — AB (ref 6.5–8.1)
Total Bilirubin: 0.3 mg/dL (ref 0.3–1.2)

## 2014-08-25 LAB — CBC
HCT: 33.6 % — ABNORMAL LOW (ref 36.0–46.0)
HEMOGLOBIN: 11.3 g/dL — AB (ref 12.0–15.0)
MCH: 30.6 pg (ref 26.0–34.0)
MCHC: 33.6 g/dL (ref 30.0–36.0)
MCV: 91.1 fL (ref 78.0–100.0)
Platelets: 197 10*3/uL (ref 150–400)
RBC: 3.69 MIL/uL — AB (ref 3.87–5.11)
RDW: 13.5 % (ref 11.5–15.5)
WBC: 12.6 10*3/uL — AB (ref 4.0–10.5)

## 2014-08-25 LAB — PROTEIN / CREATININE RATIO, URINE: CREATININE, URINE: 26 mg/dL

## 2014-08-25 LAB — LACTATE DEHYDROGENASE: LDH: 123 U/L (ref 98–192)

## 2014-08-25 LAB — URIC ACID: URIC ACID, SERUM: 4.6 mg/dL (ref 2.3–6.6)

## 2014-08-25 MED ORDER — ACETAMINOPHEN 500 MG PO TABS
1000.0000 mg | ORAL_TABLET | Freq: Once | ORAL | Status: AC
  Administered 2014-08-25: 1000 mg via ORAL
  Filled 2014-08-25: qty 2

## 2014-08-25 NOTE — MAU Note (Signed)
Contractions this evening since about 1900. Stronger and closer. Denies LOF or bleeding. Baby with heart arrthymia

## 2014-08-25 NOTE — Discharge Instructions (Signed)
Braxton Hicks Contractions °Contractions of the uterus can occur throughout pregnancy. Contractions are not always a sign that you are in labor.  °WHAT ARE BRAXTON HICKS CONTRACTIONS?  °Contractions that occur before labor are called Braxton Hicks contractions, or false labor. Toward the end of pregnancy (32-34 weeks), these contractions can develop more often and may become more forceful. This is not true labor because these contractions do not result in opening (dilatation) and thinning of the cervix. They are sometimes difficult to tell apart from true labor because these contractions can be forceful and people have different pain tolerances. You should not feel embarrassed if you go to the hospital with false labor. Sometimes, the only way to tell if you are in true labor is for your health care provider to look for changes in the cervix. °If there are no prenatal problems or other health problems associated with the pregnancy, it is completely safe to be sent home with false labor and await the onset of true labor. °HOW CAN YOU TELL THE DIFFERENCE BETWEEN TRUE AND FALSE LABOR? °False Labor °· The contractions of false labor are usually shorter and not as hard as those of true labor.   °· The contractions are usually irregular.   °· The contractions are often felt in the front of the lower abdomen and in the groin.   °· The contractions may go away when you walk around or change positions while lying down.   °· The contractions get weaker and are shorter lasting as time goes on.   °· The contractions do not usually become progressively stronger, regular, and closer together as with true labor.   °True Labor °· Contractions in true labor last 30-70 seconds, become very regular, usually become more intense, and increase in frequency.   °· The contractions do not go away with walking.   °· The discomfort is usually felt in the top of the uterus and spreads to the lower abdomen and low back.   °· True labor can be  determined by your health care provider with an exam. This will show that the cervix is dilating and getting thinner.   °WHAT TO REMEMBER °· Keep up with your usual exercises and follow other instructions given by your health care provider.   °· Take medicines as directed by your health care provider.   °· Keep your regular prenatal appointments.   °· Eat and drink lightly if you think you are going into labor.   °· If Braxton Hicks contractions are making you uncomfortable:   °¨ Change your position from lying down or resting to walking, or from walking to resting.   °¨ Sit and rest in a tub of warm water.   °¨ Drink 2-3 glasses of water. Dehydration may cause these contractions.   °¨ Do slow and deep breathing several times an hour.   °WHEN SHOULD I SEEK IMMEDIATE MEDICAL CARE? °Seek immediate medical care if: °· Your contractions become stronger, more regular, and closer together.   °· You have fluid leaking or gushing from your vagina.   °· You have a fever.   °· You pass blood-tinged mucus.   °· You have vaginal bleeding.   °· You have continuous abdominal pain.   °· You have low back pain that you never had before.   °· You feel your baby's head pushing down and causing pelvic pressure.   °· Your baby is not moving as much as it used to.   °Document Released: 02/02/2005 Document Revised: 02/07/2013 Document Reviewed: 11/14/2012 °[employer]® Patient Information ©2015 [employer]. This information is not intended to replace advice given to you by your health care   provider. Make sure you discuss any questions you have with your health care provider. ° °

## 2014-08-25 NOTE — MAU Provider Note (Signed)
History     CSN: [id number]  Arrival date and time: 08/25/14 0020   First Provider Initiated Contact with Patient 08/25/14 0118      Chief Complaint  Patient presents with  . Contractions   HPI  Ellen Vasquez is a 39 y.o. G3P2002 at [redacted]w[redacted]d who presents to MAU today for labor eval and was found to have headache and slightly elevated BP upon arrival. The patient states a history of migraines. She states headache pain now is rated 7/10 and has been present since last night. She has not taken anything for pain. She denies any history of HTN or elevated BP. She endorses associated floaters and LE edema. She denies blurred vision, abdominal pain, vaginal bleeding or LOF. She states LE edema has been mild and improves with rest. She states painful contractions tonight, but states that they are not frequent. She reports good fetal movement.   OB History    Gravida Para Term Preterm AB TAB SAB Ectopic Multiple Living   3 2 2       2       Past Medical History  Diagnosis Date  . Headache   . Endometriosis 1997  . Otosclerosis 2008  . Chiari malformation type I     MRI [facility] 2013  . Anxiety   . Headache in pregnancy     Past Surgical History  Procedure Laterality Date  . Wisdom tooth extraction  2008  . Shoulder surgery  2000  . Tonsillectomy and adenoidectomy  2010  . Laparoscopic abdominal exploration  06/2013    has had 4 surgeries  . Dilation and curettage of uterus  May 2015    Family History  Problem Relation Age of Onset  . Asthma Father   . Cancer Maternal Grandmother     skin  . Hypertension Other   . Hyperlipidemia Other   . Stroke Other   . Obesity Other   . Arthritis Father     Rheumatoid  . Aneurysm Paternal Grandfather     brain  . Skin cancer Paternal Grandmother     History  Substance Use Topics  . Smoking status: Former Smoker    Types: Cigarettes    Quit date: 11/25/2001  . Smokeless tobacco: Never Used  . Alcohol Use: No     Allergies:  Allergies  Allergen Reactions  . Shellfish Allergy Hives  . Sulfa Antibiotics Dermatitis  . Other     Surgical glue causes a rash    Prescriptions prior to admission  Medication Sig Dispense Refill Last Dose  . Prenatal Vit-Fe Fumarate-FA (PRENATAL MULTIVITAMIN) TABS tablet Take 1 tablet by mouth daily at 12 noon.   08/24/2014 at Unknown time  . PRESCRIPTION MEDICATION Amoxicillin for ear infection twice a day.   08/24/2014 at Unknown time  . ranitidine (ZANTAC) 150 MG tablet Take 150 mg by mouth 2 (two) times daily.   08/24/2014 at Unknown time  . sertraline (ZOLOFT) 50 MG tablet Take 50 mg by mouth daily.   08/24/2014 at Unknown time  . acetaminophen (TYLENOL) 500 MG tablet Take 500 mg by mouth daily as needed for headache.   Past Week at Unknown time  . cyclobenzaprine (FLEXERIL) 10 MG tablet Take 10 mg by mouth every 8 (eight) hours as needed for muscle spasms.   0 Past Month at Unknown time  . diphenhydrAMINE (BENADRYL) 12.5 MG chewable tablet Chew 12.5 mg by mouth 4 (four) times daily as needed for allergies.   07/14/2014 at Unknown  time  . hydrocortisone cream 1 % Apply 1 application topically 2 (two) times daily as needed for itching (rash).   07/14/2014 at Unknown time  . NIFEdipine (PROCARDIA) 10 MG capsule Take 1 capsule (10 mg total) by mouth every 6 (six) hours as needed (for greater than 5 contractions per hour). 10 capsule 0     Review of Systems  Constitutional: Negative for fever and malaise/fatigue.  Eyes: Negative for blurred vision.       + floaters  Cardiovascular: Positive for leg swelling.  Gastrointestinal: Positive for abdominal pain. Negative for nausea, vomiting, diarrhea and constipation.  Genitourinary:       Neg - vaginal bleeding, discharge  Neurological: Positive for headaches.   Physical Exam   Blood pressure 124/78, pulse 79, temperature 98.1 F (36.7 C), temperature source Oral, resp. rate 16, height 5\' 9"  (1.753 m), weight 201 lb 3.2 oz  (91.264 kg), last menstrual period 11/30/2013.  Physical Exam  Nursing note and vitals reviewed. Constitutional: She is oriented to person, place, and time. She appears well-developed and well-nourished. No distress.  HENT:  Head: Normocephalic and atraumatic.  Eyes: EOM are normal.  Cardiovascular: Normal rate.   Respiratory: Effort normal.  GI: Soft. She exhibits no distension and no mass. There is tenderness (mild RUQ tenderness to palpation). There is no rebound and no guarding.  Musculoskeletal: She exhibits edema (mild non-pitting edema noted bilaterally).  Neurological: She is alert and oriented to person, place, and time. She has normal reflexes.  No clonus  Skin: Skin is warm and dry. No erythema.  Psychiatric: She has a normal mood and affect.  Dilation: Closed Effacement (%): 50 Cervical Position: Posterior Station: -3 Presentation: Undeterminable Exam by:: Blima Singer  RNC  Results for orders placed or performed during the hospital encounter of 08/25/14 (from the past 24 hour(s))  CBC     Status: Abnormal   Collection Time: 08/25/14  1:25 AM  Result Value Ref Range   WBC 12.6 (H) 4.0 - 10.5 K/uL   RBC 3.69 (L) 3.87 - 5.11 MIL/uL   Hemoglobin 11.3 (L) 12.0 - 15.0 g/dL   HCT 33.6 (L) 36.0 - 46.0 %   MCV 91.1 78.0 - 100.0 fL   MCH 30.6 26.0 - 34.0 pg   MCHC 33.6 30.0 - 36.0 g/dL   RDW 13.5 11.5 - 15.5 %   Platelets 197 150 - 400 K/uL  Comprehensive metabolic panel     Status: Abnormal   Collection Time: 08/25/14  1:25 AM  Result Value Ref Range   Sodium 133 (L) 135 - 145 mmol/L   Potassium 3.3 (L) 3.5 - 5.1 mmol/L   Chloride 106 101 - 111 mmol/L   CO2 23 22 - 32 mmol/L   Glucose, Bld 115 (H) 65 - 99 mg/dL   BUN 6 6 - 20 mg/dL   Creatinine, Ser 0.71 0.44 - 1.00 mg/dL   Calcium 8.3 (L) 8.9 - 10.3 mg/dL   Total Protein 5.5 (L) 6.5 - 8.1 g/dL   Albumin 3.1 (L) 3.5 - 5.0 g/dL   AST 19 15 - 41 U/L   ALT 12 (L) 14 - 54 U/L   Alkaline Phosphatase 128 (H) 38 - 126 U/L    Total Bilirubin 0.3 0.3 - 1.2 mg/dL   GFR calc non Af Amer >60 >60 mL/min   GFR calc Af Amer >60 >60 mL/min   Anion gap 4 (L) 5 - 15  Lactate dehydrogenase     Status: None  Collection Time: 08/25/14  1:25 AM  Result Value Ref Range   LDH 123 98 - 192 U/L  Uric acid     Status: None   Collection Time: 08/25/14  1:25 AM  Result Value Ref Range   Uric Acid, Serum 4.6 2.3 - 6.6 mg/dL  Protein / creatinine ratio, urine     Status: None   Collection Time: 08/25/14  1:35 AM  Result Value Ref Range   Creatinine, Urine 26.00 mg/dL   Total Protein, Urine <6 mg/dL   Protein Creatinine Ratio        0.00 - 0.15 mg/mg[Cre]  Urinalysis, Routine w reflex microscopic (not at [facility])     Status: Abnormal   Collection Time: 08/25/14  1:35 AM  Result Value Ref Range   Color, Urine YELLOW YELLOW   APPearance CLEAR CLEAR   Specific Gravity, Urine <1.005 (L) 1.005 - 1.030   pH 7.0 5.0 - 8.0   Glucose, UA NEGATIVE NEGATIVE mg/dL   Hgb urine dipstick NEGATIVE NEGATIVE   Bilirubin Urine NEGATIVE NEGATIVE   Ketones, ur NEGATIVE NEGATIVE mg/dL   Protein, ur NEGATIVE NEGATIVE mg/dL   Urobilinogen, UA 0.2 0.0 - 1.0 mg/dL   Nitrite NEGATIVE NEGATIVE   Leukocytes, UA NEGATIVE NEGATIVE    Fetal Monitoring: Baseline: 120 bpm, moderate variability, + accelerations, no decelerations Contractions: few, irregular  Patient Vitals for the past 24 hrs:  BP Temp Temp src Pulse Resp Height Weight  08/25/14 0237 124/78 mmHg 98.1 F (36.7 C) Oral 79 16 - -  08/25/14 0203 118/70 mmHg - - 84 - - -  08/25/14 0135 126/65 mmHg - - 86 - - -  08/25/14 0112 143/72 mmHg - - 83 - - -  08/25/14 0026 133/71 mmHg 97.6 F (36.4 C) - 89 20 5\' 9"  (1.753 m) 201 lb 3.2 oz (91.264 kg)    MAU Course  Procedures None  MDM CBC, CMP, UA, Uric Acid, LDH and Protein/creatinine ratio today to rule out pre-eclampsia Serial BPs Tylenol given in MAU. Patient denies significant improvement in headache. BP has normalized. Labs are  unremarkable for evidence of pre-eclampsia. Patient declines additional pain medication for headache prior to discharge, stating that overuse of pain medication often triggers her migraines.  Assessment and Plan  A: SIUP at [redacted]w[redacted]d Headache  P: Discharge home Tylenol PRN for headache Labor precautions discussed Patient advised to follow-up with [facility] as scheduled for routine prenatal care or sooner PRN Patient may return to MAU as needed or if her condition were to change or worsen   Luvenia Redden, PA-C  08/25/2014, 2:39 AM

## 2014-08-29 ENCOUNTER — Inpatient Hospital Stay (HOSPITAL_COMMUNITY): Payer: BLUE CROSS/BLUE SHIELD | Admitting: Anesthesiology

## 2014-08-29 ENCOUNTER — Inpatient Hospital Stay (HOSPITAL_COMMUNITY)
Admission: AD | Admit: 2014-08-29 | Discharge: 2014-09-01 | DRG: 775 | Disposition: A | Discharge status: Home / Self Care | Payer: BLUE CROSS/BLUE SHIELD | Source: Ambulatory Visit | Attending: Obstetrics and Gynecology | Admitting: Obstetrics and Gynecology

## 2014-08-29 ENCOUNTER — Encounter (HOSPITAL_COMMUNITY): Payer: Self-pay | Admitting: *Deleted

## 2014-08-29 DIAGNOSIS — Z87891 Personal history of nicotine dependence: Secondary | ICD-10-CM

## 2014-08-29 DIAGNOSIS — O99344 Other mental disorders complicating childbirth: Secondary | ICD-10-CM | POA: Diagnosis present

## 2014-08-29 DIAGNOSIS — O09523 Supervision of elderly multigravida, third trimester: Secondary | ICD-10-CM | POA: Diagnosis not present

## 2014-08-29 DIAGNOSIS — Z823 Family history of stroke: Secondary | ICD-10-CM

## 2014-08-29 DIAGNOSIS — Z825 Family history of asthma and other chronic lower respiratory diseases: Secondary | ICD-10-CM | POA: Diagnosis not present

## 2014-08-29 DIAGNOSIS — O9962 Diseases of the digestive system complicating childbirth: Secondary | ICD-10-CM | POA: Diagnosis present

## 2014-08-29 DIAGNOSIS — K219 Gastro-esophageal reflux disease without esophagitis: Secondary | ICD-10-CM | POA: Diagnosis present

## 2014-08-29 DIAGNOSIS — F419 Anxiety disorder, unspecified: Secondary | ICD-10-CM | POA: Diagnosis present

## 2014-08-29 DIAGNOSIS — Z3A39 39 weeks gestation of pregnancy: Secondary | ICD-10-CM | POA: Diagnosis present

## 2014-08-29 DIAGNOSIS — Z808 Family history of malignant neoplasm of other organs or systems: Secondary | ICD-10-CM | POA: Diagnosis not present

## 2014-08-29 DIAGNOSIS — IMO0001 Reserved for inherently not codable concepts without codable children: Secondary | ICD-10-CM

## 2014-08-29 DIAGNOSIS — Z8249 Family history of ischemic heart disease and other diseases of the circulatory system: Secondary | ICD-10-CM | POA: Diagnosis not present

## 2014-08-29 HISTORY — DX: Depression, unspecified: F32.A

## 2014-08-29 HISTORY — DX: Major depressive disorder, single episode, unspecified: F32.9

## 2014-08-29 LAB — CBC
HCT: 35.6 % — ABNORMAL LOW (ref 36.0–46.0)
Hemoglobin: 11.9 g/dL — ABNORMAL LOW (ref 12.0–15.0)
MCH: 30.3 pg (ref 26.0–34.0)
MCHC: 33.4 g/dL (ref 30.0–36.0)
MCV: 90.6 fL (ref 78.0–100.0)
PLATELETS: 200 10*3/uL (ref 150–400)
RBC: 3.93 MIL/uL (ref 3.87–5.11)
RDW: 13.5 % (ref 11.5–15.5)
WBC: 14 10*3/uL — ABNORMAL HIGH (ref 4.0–10.5)

## 2014-08-29 LAB — TYPE AND SCREEN
ABO/RH(D): B POS
Antibody Screen: NEGATIVE

## 2014-08-29 LAB — URINALYSIS, ROUTINE W REFLEX MICROSCOPIC
Bilirubin Urine: NEGATIVE
Glucose, UA: NEGATIVE mg/dL
Hgb urine dipstick: NEGATIVE
Ketones, ur: NEGATIVE mg/dL
Leukocytes, UA: NEGATIVE
Nitrite: NEGATIVE
PH: 6.5 (ref 5.0–8.0)
Protein, ur: NEGATIVE mg/dL
Specific Gravity, Urine: <1.005 — ABNORMAL LOW (ref 1.005–1.030)
Urobilinogen, UA: 0.2 mg/dL (ref 0.0–1.0)

## 2014-08-29 LAB — ABO/RH: ABO/RH(D): B POS

## 2014-08-29 MED ORDER — LIDOCAINE HCL (PF) 1 % IJ SOLN
INTRAMUSCULAR | Status: DC | PRN
  Administered 2014-08-29 (×2): 4 mL via EPIDURAL

## 2014-08-29 MED ORDER — PHENYLEPHRINE 40 MCG/ML (10ML) SYRINGE FOR IV PUSH (FOR BLOOD PRESSURE SUPPORT)
80.0000 ug | PREFILLED_SYRINGE | INTRAVENOUS | Status: DC | PRN
  Filled 2014-08-29: qty 2

## 2014-08-29 MED ORDER — OXYTOCIN BOLUS FROM INFUSION: 500.0000 mL | INTRAVENOUS | Status: DC

## 2014-08-29 MED ORDER — DIPHENHYDRAMINE HCL 50 MG/ML IJ SOLN: 12.5000 mg | INTRAMUSCULAR | Status: DC | PRN

## 2014-08-29 MED ORDER — TERBUTALINE SULFATE 1 MG/ML IJ SOLN: 0.2500 mg | Freq: Once | INTRAMUSCULAR | Status: AC | PRN

## 2014-08-29 MED ORDER — OXYTOCIN 40 UNITS IN LACTATED RINGERS INFUSION - SIMPLE MED
1.0000 m[IU]/min | INTRAVENOUS | Status: DC
  Administered 2014-08-29: 4 m[IU]/min via INTRAVENOUS
  Administered 2014-08-29: 2 m[IU]/min via INTRAVENOUS

## 2014-08-29 MED ORDER — FENTANYL 2.5 MCG/ML BUPIVACAINE 1/10 % EPIDURAL INFUSION (WH - ANES)
14.0000 mL/h | INTRAMUSCULAR | Status: DC | PRN
  Administered 2014-08-29: 14.5 mL/h via EPIDURAL
  Filled 2014-08-29 (×3): qty 125

## 2014-08-29 MED ORDER — CITRIC ACID-SODIUM CITRATE 334-500 MG/5ML PO SOLN
30.0000 mL | ORAL | Status: DC | PRN
  Administered 2014-08-30 (×3): 30 mL via ORAL
  Filled 2014-08-29 (×3): qty 15

## 2014-08-29 MED ORDER — LIDOCAINE HCL (PF) 1 % IJ SOLN
30.0000 mL | INTRAMUSCULAR | Status: AC | PRN
  Administered 2014-08-30: 30 mL via SUBCUTANEOUS
  Filled 2014-08-29: qty 30

## 2014-08-29 MED ORDER — ONDANSETRON HCL 4 MG/2ML IJ SOLN: 4.0000 mg | Freq: Four times a day (QID) | INTRAMUSCULAR | Status: DC | PRN

## 2014-08-29 MED ORDER — ACETAMINOPHEN 325 MG PO TABS
650.0000 mg | ORAL_TABLET | ORAL | Status: DC | PRN
  Administered 2014-08-29: 650 mg via ORAL
  Filled 2014-08-29: qty 2

## 2014-08-29 MED ORDER — LACTATED RINGERS IV SOLN
INTRAVENOUS | Status: DC
  Administered 2014-08-29 – 2014-08-30 (×3): via INTRAVENOUS

## 2014-08-29 MED ORDER — OXYCODONE-ACETAMINOPHEN 5-325 MG PO TABS: 2.0000 | ORAL_TABLET | ORAL | Status: DC | PRN

## 2014-08-29 MED ORDER — LACTATED RINGERS IV SOLN
500.0000 mL | INTRAVENOUS | Status: DC | PRN
  Administered 2014-08-29: 500 mL via INTRAVENOUS
  Administered 2014-08-30: 250 mL via INTRAVENOUS

## 2014-08-29 MED ORDER — OXYCODONE-ACETAMINOPHEN 5-325 MG PO TABS: 1.0000 | ORAL_TABLET | ORAL | Status: DC | PRN

## 2014-08-29 MED ORDER — PHENYLEPHRINE 40 MCG/ML (10ML) SYRINGE FOR IV PUSH (FOR BLOOD PRESSURE SUPPORT)
80.0000 ug | PREFILLED_SYRINGE | INTRAVENOUS | Status: DC | PRN
  Filled 2014-08-29: qty 2
  Filled 2014-08-29: qty 20

## 2014-08-29 MED ORDER — OXYTOCIN 40 UNITS IN LACTATED RINGERS INFUSION - SIMPLE MED
62.5000 mL/h | INTRAVENOUS | Status: DC
  Administered 2014-08-30: 62.5 mL/h via INTRAVENOUS
  Administered 2014-08-30: 999 mL/h via INTRAVENOUS
  Filled 2014-08-29 (×2): qty 1000

## 2014-08-29 MED ORDER — EPHEDRINE 5 MG/ML INJ
10.0000 mg | INTRAVENOUS | Status: DC | PRN
  Filled 2014-08-29: qty 2

## 2014-08-29 MED ORDER — FENTANYL 2.5 MCG/ML BUPIVACAINE 1/10 % EPIDURAL INFUSION (WH - ANES)
14.5000 mL/h | INTRAMUSCULAR | Status: DC | PRN
  Administered 2014-08-29 – 2014-08-30 (×3): 14.5 mL/h via EPIDURAL

## 2014-08-29 MED ORDER — FLEET ENEMA 7-19 GM/118ML RE ENEM: 1.0000 | ENEMA | RECTAL | Status: DC | PRN

## 2014-08-29 NOTE — MAU Note (Signed)
Pt states she has been having contractions since Friday.  Today have progressed and more regular.  Now every 4 minutes.  Denies vaginal bleeding or ROM.  Good fetal movement.

## 2014-08-29 NOTE — Anesthesia Preprocedure Evaluation (Signed)
Anesthesia Evaluation  Patient identified by MRN, date of birth, ID band Patient awake    Reviewed: Allergy & Precautions, Patient's Chart, lab work & pertinent test results  Airway Mallampati: III  TM Distance: >3 FB Neck ROM: Full    Dental no notable dental hx. (+) Teeth Intact   Pulmonary former smoker,  breath sounds clear to auscultation  Pulmonary exam normal       Cardiovascular negative cardio ROS Normal cardiovascular examRhythm:Regular Rate:Normal     Neuro/Psych  Headaches, PSYCHIATRIC DISORDERS Anxiety Depression Chiari Type I Malformation    GI/Hepatic Neg liver ROS, GERD-  Medicated and Controlled,  Endo/Other  negative endocrine ROS  Renal/GU negative Renal ROS  negative genitourinary   Musculoskeletal negative musculoskeletal ROS (+)   Abdominal   Peds  Hematology  (+) anemia ,   Anesthesia Other Findings   Reproductive/Obstetrics (+) Pregnancy                             Anesthesia Physical Anesthesia Plan  ASA: II  Anesthesia Plan: Epidural   Post-op Pain Management:    Induction:   Airway Management Planned: Natural Airway  Additional Equipment:   Intra-op Plan:   Post-operative Plan:   Informed Consent: I have reviewed the patients History and Physical, chart, labs and discussed the procedure including the risks, benefits and alternatives for the proposed anesthesia with the patient or authorized representative who has indicated his/her understanding and acceptance.     Plan Discussed with: Anesthesiologist  Anesthesia Plan Comments:         Anesthesia Quick Evaluation

## 2014-08-29 NOTE — Anesthesia Procedure Notes (Signed)
Epidural Patient location during procedure: OB Start time: 08/29/2014 10:59 PM  Staffing Anesthesiologist: Josephine Igo Performed by: anesthesiologist   Preanesthetic Checklist Completed: patient identified, site marked, surgical consent, pre-op evaluation, timeout performed, IV checked, risks and benefits discussed and monitors and equipment checked  Epidural Patient position: sitting Prep: site prepped and draped and DuraPrep Patient monitoring: continuous pulse ox and blood pressure Approach: midline Location: L3-L4 Injection technique: LOR air  Needle:  Needle type: Tuohy  Needle gauge: 17 G Needle length: 9 cm and 9 Needle insertion depth: 6 cm Catheter type: closed end flexible Catheter size: 19 Gauge Catheter at skin depth: 11 cm Test dose: negative and Other  Assessment Events: blood not aspirated, injection not painful, no injection resistance, negative IV test and no paresthesia  Additional Notes Patient identified. Risks and benefits discussed including failed block, incomplete  Pain control, post dural puncture headache, nerve damage, paralysis, blood pressure Changes, nausea, vomiting, reactions to medications-both toxic and allergic and post Partum back pain. All questions were answered. Patient expressed understanding and wished to proceed. Sterile technique was used throughout procedure. Epidural site was Dressed with sterile barrier dressing. No paresthesias, signs of intravascular injection Or signs of intrathecal spread were encountered.  Patient was more comfortable after the epidural was dosed. Please see RN's note for documentation of vital signs and FHR which are stable.

## 2014-08-30 ENCOUNTER — Encounter (HOSPITAL_COMMUNITY): Payer: Self-pay | Admitting: *Deleted

## 2014-08-30 LAB — RPR: RPR: NONREACTIVE

## 2014-08-30 MED ORDER — ZOLPIDEM TARTRATE 5 MG PO TABS: 5.0000 mg | ORAL_TABLET | Freq: Every evening | ORAL | Status: DC | PRN

## 2014-08-30 MED ORDER — DIPHENHYDRAMINE HCL 25 MG PO CAPS: 25.0000 mg | ORAL_CAPSULE | Freq: Four times a day (QID) | ORAL | Status: DC | PRN

## 2014-08-30 MED ORDER — LANOLIN HYDROUS EX OINT: TOPICAL_OINTMENT | CUTANEOUS | Status: DC | PRN

## 2014-08-30 MED ORDER — ACETAMINOPHEN 325 MG PO TABS: 650.0000 mg | ORAL_TABLET | ORAL | Status: DC | PRN

## 2014-08-30 MED ORDER — OXYCODONE-ACETAMINOPHEN 5-325 MG PO TABS: 2.0000 | ORAL_TABLET | ORAL | Status: DC | PRN

## 2014-08-30 MED ORDER — SIMETHICONE 80 MG PO CHEW
80.0000 mg | CHEWABLE_TABLET | ORAL | Status: DC | PRN
  Administered 2014-08-31: 80 mg via ORAL
  Filled 2014-08-30: qty 1

## 2014-08-30 MED ORDER — TETANUS-DIPHTH-ACELL PERTUSSIS 5-2.5-18.5 LF-MCG/0.5 IM SUSP: 0.5000 mL | Freq: Once | INTRAMUSCULAR | Status: DC

## 2014-08-30 MED ORDER — SERTRALINE HCL 50 MG PO TABS
50.0000 mg | ORAL_TABLET | Freq: Every day | ORAL | Status: DC
  Administered 2014-08-31 – 2014-09-01 (×2): 50 mg via ORAL
  Filled 2014-08-30 (×4): qty 1

## 2014-08-30 MED ORDER — IBUPROFEN 600 MG PO TABS
600.0000 mg | ORAL_TABLET | Freq: Four times a day (QID) | ORAL | Status: DC
  Administered 2014-08-30 – 2014-09-01 (×9): 600 mg via ORAL
  Filled 2014-08-30 (×9): qty 1

## 2014-08-30 MED ORDER — OXYCODONE-ACETAMINOPHEN 5-325 MG PO TABS
1.0000 | ORAL_TABLET | ORAL | Status: DC | PRN
  Administered 2014-08-31 – 2014-09-01 (×9): 1 via ORAL
  Filled 2014-08-30 (×9): qty 1

## 2014-08-30 MED ORDER — ONDANSETRON HCL 4 MG PO TABS: 4.0000 mg | ORAL_TABLET | ORAL | Status: DC | PRN

## 2014-08-30 MED ORDER — WITCH HAZEL-GLYCERIN EX PADS: 1.0000 "application " | MEDICATED_PAD | CUTANEOUS | Status: DC | PRN

## 2014-08-30 MED ORDER — MEASLES, MUMPS & RUBELLA VAC ~~LOC~~ INJ
0.5000 mL | INJECTION | Freq: Once | SUBCUTANEOUS | Status: DC
  Filled 2014-08-30: qty 0.5

## 2014-08-30 MED ORDER — PRENATAL MULTIVITAMIN CH
1.0000 | ORAL_TABLET | Freq: Every day | ORAL | Status: DC
  Administered 2014-08-31 – 2014-09-01 (×2): 1 via ORAL
  Filled 2014-08-30 (×2): qty 1

## 2014-08-30 MED ORDER — MEDROXYPROGESTERONE ACETATE 150 MG/ML IM SUSP: 150.0000 mg | INTRAMUSCULAR | Status: DC | PRN

## 2014-08-30 MED ORDER — BENZOCAINE-MENTHOL 20-0.5 % EX AERO
1.0000 "application " | INHALATION_SPRAY | CUTANEOUS | Status: DC | PRN
  Filled 2014-08-30: qty 56

## 2014-08-30 MED ORDER — DIBUCAINE 1 % RE OINT: 1.0000 "application " | TOPICAL_OINTMENT | RECTAL | Status: DC | PRN

## 2014-08-30 MED ORDER — SENNOSIDES-DOCUSATE SODIUM 8.6-50 MG PO TABS
2.0000 | ORAL_TABLET | ORAL | Status: DC
  Administered 2014-08-31 (×2): 2 via ORAL
  Filled 2014-08-30 (×2): qty 2

## 2014-08-30 MED ORDER — ONDANSETRON HCL 4 MG/2ML IJ SOLN: 4.0000 mg | INTRAMUSCULAR | Status: DC | PRN

## 2014-08-30 NOTE — H&P (Signed)
Ellen Vasquez is a 39 y.o. female presenting for UCs coming and going, now regular. Pregnancy complicated by AMA with negative screen, migraine HA with floaters, back and abdominal pain, anxiety on Zoloft. Maternal Medical History:  Reason for admission: Contractions.   Contractions: Onset was 3-5 hours ago.    Fetal activity: Perceived fetal activity is normal.      OB History    Gravida Para Term Preterm AB TAB SAB Ectopic Multiple Living   3 2 2       2      Past Medical History  Diagnosis Date  . Headache   . Endometriosis 1997  . Otosclerosis 2008  . Chiari malformation type I     MRI [facility] 2013  . Anxiety   . Headache in pregnancy   . Depression     PPD with 2nd baby   Past Surgical History  Procedure Laterality Date  . Wisdom tooth extraction  2008  . Shoulder surgery  2000  . Tonsillectomy and adenoidectomy  2010  . Laparoscopic abdominal exploration  06/2013    has had 4 surgeries  . Dilation and curettage of uterus  May 2015   Family History: family history includes Aneurysm in her paternal grandfather; Arthritis in her father; Asthma in her father; Cancer in her maternal grandmother; Hyperlipidemia in her other; Hypertension in her other; Obesity in her other; Skin cancer in her paternal grandmother; Stroke in her other. Social History:  reports that she quit smoking about 12 years ago. Her smoking use included Cigarettes. She has never used smokeless tobacco. She reports that she does not drink alcohol or use illicit drugs.   Prenatal Transfer Tool  Maternal Diabetes: No Genetic Screening: Normal Maternal Ultrasounds/Referrals: Normal Fetal Ultrasounds or other Referrals:  None Maternal Substance Abuse:  No Significant Maternal Medications:  Meds include: Other: zoloft Significant Maternal Lab Results:  None Other Comments:  None  Review of Systems  HENT:       Hx of migraine HA  Eyes:       Has had floaters for several weeks with benign optho  exam  Musculoskeletal:       Hip and back pain    Dilation: 2 Effacement (%): 50 Station: -3 Exam by:: Dr. Gaetano Net Blood pressure 133/71, pulse 82, temperature 98.4 F (36.9 C), temperature source Oral, resp. rate 18, height 5\' 9"  (1.753 m), weight 202 lb (91.627 kg), last menstrual period 11/30/2013, SpO2 99 %. Maternal Exam:  Uterine Assessment: Contraction strength is firm.  Contraction frequency is regular.   Abdomen: Patient reports no abdominal tenderness. Fetal presentation: vertex     Fetal Exam Fetal State Assessment: Category I - tracings are normal.     Physical Exam  Cardiovascular: Normal rate and regular rhythm.   Respiratory: Effort normal and breath sounds normal.  GI: Soft. There is no tenderness.  Neurological: She has normal reflexes.    Prenatal labs: ABO, Rh: --/--/B POS, B POS (07/13 1810) Antibody: NEG (07/13 1810) Rubella: Immune (12/30 0000) RPR: Nonreactive (12/30 0000)  HBsAg: Negative (12/30 0000)  HIV: Non-reactive (12/30 0000)  GBS: Negative (06/27 0000)   Assessment/Plan: 39 yo G3P2 at 39 weeks with UCs UCs decreased some with IV fluid bolus, now on pitocin   Ellen Vasquez,Ellen Vasquez 08/30/2014, 12:49 AM

## 2014-08-30 NOTE — Progress Notes (Signed)
Dr. Gaetano Net want pit to be left at 48mu for a while

## 2014-08-30 NOTE — Progress Notes (Signed)
Pt comfortable  FHT cat 1, reassuring Toco Q3-5 Cvx 3/50/-2, vtx IUPC placed  A/P:  Exp mngt

## 2014-08-30 NOTE — Progress Notes (Signed)
SVD of viable female infant w/ apgars of 7,9.  Placenta delivered spontaneous w/ 3VC.   Lidocaine used for local anesthesia 2nd degree lac repaired w/ 3-0 vicryl rapide.  Fundus firm.  EBL 200cc .

## 2014-08-30 NOTE — Progress Notes (Signed)
Cx 2/soft/-2 AROM clear FHT reactive UCs q2-4

## 2014-08-31 LAB — CBC
HCT: 32.9 % — ABNORMAL LOW (ref 36.0–46.0)
Hemoglobin: 11 g/dL — ABNORMAL LOW (ref 12.0–15.0)
MCH: 30.5 pg (ref 26.0–34.0)
MCHC: 33.4 g/dL (ref 30.0–36.0)
MCV: 91.1 fL (ref 78.0–100.0)
Platelets: 179 10*3/uL (ref 150–400)
RBC: 3.61 MIL/uL — AB (ref 3.87–5.11)
RDW: 13.7 % (ref 11.5–15.5)
WBC: 21.5 10*3/uL — ABNORMAL HIGH (ref 4.0–10.5)

## 2014-08-31 NOTE — Progress Notes (Signed)
Post Partum Day 1 Subjective: tolerating PO  Objective: Blood pressure 100/54, pulse 65, temperature 97.5 F (36.4 C), temperature source Oral, resp. rate 18, height 5\' 9"  (1.753 m), weight 202 lb (91.627 kg), last menstrual period 11/30/2013, SpO2 99 %, unknown if currently breastfeeding.  Physical Exam:  General: alert Lochia: appropriate Uterine Fundus: firm Incision: healing well DVT Evaluation: No evidence of DVT seen on physical exam.   Recent Labs  08/29/14 1810 08/31/14 0540  HGB 11.9* 11.0*  HCT 35.6* 32.9*    Assessment/Plan: Plan for discharge tomorrow   LOS: 2 days   Aarini Slee M 08/31/2014, 9:03 AM

## 2014-08-31 NOTE — Progress Notes (Signed)
MOB was referred for history of depression/anxiety.  Referral is screened out by Clinical Social Worker because none of the following criteria appear to apply: -History of anxiety/depression during this pregnancy, or of post-partum depression. - Diagnosis of anxiety and/or depression within last 3 years or -MOB's symptoms are currently being treated with medication and/or therapy.  MOB is currently prescribed Zoloft.   Please contact the Clinical Social Worker if needs arise or upon MOB request.

## 2014-08-31 NOTE — Anesthesia Postprocedure Evaluation (Signed)
Anesthesia Post Note  Patient: Ellen Vasquez  Procedure(s) Performed: * No procedures listed *  Anesthesia type: Epidural  Patient location: Mother/Baby  Post pain: Pain level controlled  Post assessment: Post-op Vital signs reviewed  Last Vitals:  Filed Vitals:   08/31/14 0638  BP: 100/54  Pulse: 65  Temp: 36.4 C  Resp: 18    Post vital signs: Reviewed  Level of consciousness:alert  Complications: No apparent anesthesia complications

## 2014-08-31 NOTE — Lactation Note (Signed)
This note was copied from the chart of Ellen Vasquez. Lactation Consultation Note Mom was sleeping once went into room. Then going into room, RN came out with baby crying in [place] w/dad to nursery d/t dusky spells. Mm upset. Baby has muscous in throat.  Patient Name: Ellen Vasquez [name] Date: 08/31/2014     Maternal Data    Feeding Feeding Type: Breast Fed Length of feed: 5 min  LATCH Score/Interventions Latch: Grasps breast easily, tongue down, lips flanged, rhythmical sucking.  Audible Swallowing: A few with stimulation  Type of Nipple: Everted at rest and after stimulation  Comfort (Breast/Nipple): Soft / non-tender     Hold (Positioning): Assistance needed to correctly position infant at breast and maintain latch.  LATCH Score: 8  Lactation Tools Discussed/Used     Consult Status      Mykelti Goldenstein G 08/31/2014, 4:45 AM

## 2014-08-31 NOTE — Lactation Note (Signed)
This note was copied from the chart of Ellen Vasquez. Lactation Consultation Note Experienced BF mom. Baby has had some dusky episodes  Baby has just spit up mucous. Took a few sucks then off to sleep. Encouraged lots of skin to skin. Encouraged to watch for feeding cues and feed whenever she sees them. BF brochure given with resources for support after DC. To call for assist prn  Patient Name: Ellen Vasquez [id number] Date: 08/31/2014 Reason for consult: Initial assessment   Maternal Data Formula Feeding for Exclusion: No Does the patient have breastfeeding experience prior to this delivery?: Yes  Feeding Feeding Type: Breast Fed  LATCH Score/Interventions Latch: Too sleepy or reluctant, no latch achieved, no sucking elicited. (few sucks, then off to sleep)  Audible Swallowing: None  Type of Nipple: Everted at rest and after stimulation  Comfort (Breast/Nipple): Soft / non-tender     Hold (Positioning): Assistance needed to correctly position infant at breast and maintain latch.  LATCH Score: 5  Lactation Tools Discussed/Used     Consult Status Consult Status: Follow-up Date: 09/01/14 Follow-up type: In-patient    Truddie Crumble 08/31/2014, 12:08 PM

## 2014-08-31 NOTE — Lactation Note (Signed)
This note was copied from the chart of Ellen Manha Amato. Lactation Consultation Note Follow up visit at 27 hours of age. MBU RN requests assist due to poor feedings and baby fussy.  When [facility] visited mom reports baby stayed latched for about 1 -2 minutes during about 30 minutes attempt.  Mom reports she has seen some colostrum and baby has been spitting up a lot of mucus earlier.  FOB changing stooled diaper. Attempted hand expression with mom who complains of soreness, 2 drops collected and applied to baby's mouth with gloved finger.  Baby is not extending tongue past lower gumline well and is biting.  Mentioned suck training may help.  Frenulum appears normal with good lateralization noted.  Mom independently latched in cradle hold and complains of pain even when lower lip rolled out.  Instructed on cross cradle hold with assist baby opened mouth wide with deep latch and flanged lips.  Baby appeared to "chomp" first and then settled into a strong rhythm and maintained feeding for about 7 minutes.  Good swallows audible during feeding.  Baby fell asleep and released breast and laid STS for a few minutes.  Baby then started to show feeding cues with tongue sticking out well past lower gumline.  Mom related for a feeding of about 4 minutes with baby needing stimulation to maintain feeding.    Patient Name: Ellen Vasquez SWNIO'E Date: 08/31/2014 Reason for consult: Follow-up assessment   Maternal Data    Feeding Feeding Type: Breast Fed Length of feed: 7 min  LATCH Score/Interventions Latch: Grasps breast easily, tongue down, lips flanged, rhythmical sucking. Intervention(s): Skin to skin;Teach feeding cues;Waking techniques Intervention(s): Adjust position;Assist with latch;Breast massage;Breast compression  Audible Swallowing: Spontaneous and intermittent  Type of Nipple: Everted at rest and after stimulation  Comfort (Breast/Nipple): Soft / non-tender     Hold  (Positioning): Assistance needed to correctly position infant at breast and maintain latch. Intervention(s): Breastfeeding basics reviewed;Support Pillows;Position options;Skin to skin  LATCH Score: 9  Lactation Tools Discussed/Used     Consult Status Consult Status: Follow-up Date: 09/01/14 Follow-up type: In-patient    Justice Britain 08/31/2014, 9:52 PM

## 2014-09-01 MED ORDER — IBUPROFEN 600 MG PO TABS: 600.0000 mg | ORAL_TABLET | Freq: Four times a day (QID) | ORAL | Status: DC

## 2014-09-01 MED ORDER — FAMOTIDINE 20 MG PO TABS
20.0000 mg | ORAL_TABLET | Freq: Two times a day (BID) | ORAL | Status: DC
  Administered 2014-09-01 (×2): 20 mg via ORAL
  Filled 2014-09-01 (×2): qty 1

## 2014-09-01 NOTE — Discharge Summary (Signed)
Obstetric Discharge Summary Reason for Admission: labor Prenatal Procedures:none Intrapartum Procedures:svd Postpartum Procedures: none Complications-Operative and Postpartum: none HEMOGLOBIN  Date Value Ref Range Status  08/31/2014 11.0* 12.0 - 15.0 g/dL Final   HCT  Date Value Ref Range Status  08/31/2014 32.9* 36.0 - 46.0 % Final    Physical Exam:  General: wnl Lochia: approp Uterine Fundus:firm Incision: n/a DVT Evaluation:no evidence of vte  Discharge Diagnoses: term preg, SVD  Discharge Information: Date: 09/01/2014 Activity: pelvic rest Dietroutine Medications: vicoprofen, pnv, motrin Condition:good Instructions: refer to booklet Discharge to: home Follow-up Information    Follow up with Margarette Asal, MD. Schedule an appointment as soon as possible for a visit in 6 weeks.   Specialty:  Obstetrics and Gynecology   Contact information:   [place] [address] [phone]       Newborn Data: Live born female  Birth Weight: 8 lb 1.5 oz (3670 g) APGAR: 7, 9  Home withrooming in, poss d/ baby in am [facility] M 09/01/2014, 4:32 PM

## 2014-09-01 NOTE — Progress Notes (Signed)
Post Partum Day 2 Subjective: tolerating PO and co le edema and back pain @ epid site  Objective: Blood pressure 119/73, pulse 74, temperature 98 F (36.7 C), temperature source Oral, resp. rate 20, height 5\' 9"  (1.753 m), weight 202 lb (91.627 kg), last menstrual period 11/30/2013, SpO2 99 %, unknown if currently breastfeeding.  Physical Exam:  General: alert Lochia: appropriate Uterine Fundus: firm Incision: healing well DVT Evaluation: No evidence of DVT seen on physical exam.   Recent Labs  08/29/14 1810 08/31/14 0540  HGB 11.9* 11.0*  HCT 35.6* 32.9*    Assessment/Plan: Plan for discharge tomorrow   LOS: 3 days   Ellen Vasquez M 09/01/2014, 9:13 AM

## 2014-09-02 ENCOUNTER — Ambulatory Visit: Payer: Self-pay

## 2014-09-02 NOTE — Lactation Note (Signed)
This note was copied from the chart of Ellen Vasquez. Lactation Consultation Note  Follow up visit.  Mom states they started supplementing with formula last night because baby was not satisfied at breast.  Stressed importance of pumping every 3 hours.  Mom recently pumped 20-30 mls of transitional milk.  C/o sore nipples.  Comfort gels given with instructions.  [facility] phone number given to call for latch assessment.  Patient Name: Ellen Moksha Dorgan [id number] Date: 09/02/2014     Maternal Data    Feeding    LATCH Score/Interventions                      Lactation Tools Discussed/Used     Consult Status      Ave Filter 09/02/2014, 11:21 AM

## 2014-10-11 ENCOUNTER — Other Ambulatory Visit: Payer: Self-pay | Admitting: Obstetrics and Gynecology

## 2014-10-12 LAB — CYTOLOGY - PAP

## 2014-10-31 ENCOUNTER — Ambulatory Visit: Payer: BLUE CROSS/BLUE SHIELD | Admitting: Nurse Practitioner

## 2014-11-21 ENCOUNTER — Encounter: Payer: Self-pay | Admitting: Nurse Practitioner

## 2014-11-21 ENCOUNTER — Ambulatory Visit (INDEPENDENT_AMBULATORY_CARE_PROVIDER_SITE_OTHER): Payer: BLUE CROSS/BLUE SHIELD | Admitting: Nurse Practitioner

## 2014-11-21 VITALS — BP 113/71 | HR 73 | Ht 69.0 in | Wt 172.4 lb

## 2014-11-21 DIAGNOSIS — R519 Headache, unspecified: Secondary | ICD-10-CM

## 2014-11-21 DIAGNOSIS — R51 Headache: Secondary | ICD-10-CM | POA: Diagnosis not present

## 2014-11-21 DIAGNOSIS — G44009 Cluster headache syndrome, unspecified, not intractable: Secondary | ICD-10-CM | POA: Diagnosis not present

## 2014-11-21 MED ORDER — TOPIRAMATE 25 MG PO TABS: 25.0000 mg | ORAL_TABLET | Freq: Every day | ORAL | Status: DC

## 2014-11-21 NOTE — Patient Instructions (Signed)
Try Topamax 25 mg at night for 1 week then increase by 25 mg weekly until dose is 100mg  or headache abates.  Relpax acutely F/U in 3 to 6 months

## 2014-11-21 NOTE — Progress Notes (Signed)
I reviewed above note and agree with the assessment and plan.  Just to add that try to avoid pregnancy while taking topamax, if she is still seeking for pregnancy, we can consider beta blockers.   Rosalin Hawking, MD PhD Stroke Neurology 11/21/2014 6:57 PM

## 2014-11-21 NOTE — [facility] Notes (Signed)
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 She has a history of migraines. She has had a chronic daily headache for over a month now and 2  migraine that lasted approximately 6 hours in the last 2 weeks. She is nauseated and can vomit with her headaches. Sometimes her face will go numb and she will have a visual aura. Stress and lack of sleep are her migraine triggers. Currently the baby is not sleeping during the night. She has been taken Motrin and Tylenol with little relief. She has tried nortriptyline as a preventive in the past however she gained 20 pounds on the medication and does not wish to use that at this time. She has used Imitrex in the past as a acute medication but it did not work the last couple of times she used it. 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 This patient is currently [redacted] weeks pregnant, in her third pregnancy. [name] had previously encountered migraines in her pregnancies but stated that with fertility treatments and the onset of the third pregnancy her migraines have a different quality and frequency to it. Most recently she has noticed that the migraines awaken her from sleep with severe associated with nausea, vomiting, the right side of her face with a throbbing quality above and behind the eye. The left face goes numb at the time the right face hurts.  She describes a visual aura with the exact lines and flashing lights. She also has some vertigo. She feels that the headaches take all her strengths out of her and less productive and certainly less functional. The spells last a couple of hours before the pain subsides. But there is a  latent headache that can last days in the background. She has palpitations and a shortness of breath at night, too. She sleeps on 2-3 pillows. She only gets about 2-3 hours sleep at time. She has been prescribed Ambien by her OB but that makes her too drowsy during the day. She had a home sleep study that was negative for obstructive sleep apnea lowest O2 content was 92%, Dr. [name] reviewed those results with her. She returns for reevaluation With her second pregnancy at age 74 she had the first time encountered any migrainous headaches. She had other forms of headaches before and sometimes intensely painful headaches but not migrainous.     REVIEW OF SYSTEMS: Full 14 system review of systems performed and notable only for those listed, all others are neg:  Constitutional: Fatigue Cardiovascular: neg Ear/Nose/Throat: Ringing in the ears Skin: neg Eyes: Light sensitivity  Respiratory: neg Gastroitestinal: neg  Hematology/Lymphatic: neg  Endocrine: neg Musculoskeletal: Joint pain, back pain Allergy/Immunology: neg Neurological: Migraine, chronic daily headache Psychiatric: Anxiety Sleep : neg   ALLERGIES: Allergies  Allergen Reactions  . Shellfish Allergy Hives  . Sulfa Antibiotics Dermatitis  . Other     Surgical glue causes a rash    HOME MEDICATIONS: Outpatient Prescriptions Prior to Visit  Medication Sig Dispense Refill  . ibuprofen (ADVIL,MOTRIN) 600 MG tablet Take 1 tablet (600 mg total) by mouth every 6 (six) hours. 30 tablet 0  . ranitidine (ZANTAC) 150 MG tablet Take 150 mg by mouth 2 (two) times daily.    Marland Kitchen  sertraline (ZOLOFT) 50 MG tablet Take 50 mg by mouth daily.    . Prenatal Vit-Fe Fumarate-FA (PRENATAL MULTIVITAMIN) TABS tablet Take 1 tablet by mouth daily at 12 noon.     No facility-administered medications prior to visit.    PAST MEDICAL HISTORY: Past Medical History  Diagnosis Date  . Headache   . Endometriosis 1997  . Otosclerosis 2008  .  Chiari malformation type I ([place])     MRI [facility] 2013  . Anxiety   . Headache in pregnancy   . Depression     PPD with 2nd baby    PAST SURGICAL HISTORY: Past Surgical History  Procedure Laterality Date  . Wisdom tooth extraction  2008  . Shoulder surgery  2000  . Tonsillectomy and adenoidectomy  2010  . Laparoscopic abdominal exploration  06/2013    has had 4 surgeries  . Dilation and curettage of uterus  May 2015    FAMILY HISTORY: Family History  Problem Relation Age of Onset  . Asthma Father   . Cancer Maternal Grandmother     skin  . Hypertension Other   . Hyperlipidemia Other   . Stroke Other   . Obesity Other   . Arthritis Father     Rheumatoid  . Aneurysm Paternal Grandfather     brain  . Skin cancer Paternal Grandmother     SOCIAL HISTORY: Social History   Social History  . Marital Status: Married    Spouse Name: N/A  . Number of Children: 2  . 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 Smoking status: Former Smoker    Types: Cigarettes    Quit date: 11/25/2001  . Smokeless tobacco: Never Used  . Alcohol Use: No  . Drug Use: No  . Sexual Activity: Yes    Birth Control/ Protection: None   Other Topics Concern  . Not on file   Social History Narrative   Caffeine none (occasional soda).     PHYSICAL EXAM  Filed Vitals:   11/21/14 0910  BP: 113/71  Pulse: 73  Height: 5\' 9"  (1.753 m)  Weight: 172 lb 6.4 oz (78.2 kg)   Body mass index is 25.45 kg/(m^2). General: The patient is awake, alert and appears not in acute distress. The patient is well groomed. Head: Normocephalic, atraumatic.  Neck is supple.  Skin: Without evidence of edema, or rash  Neurologic exam : The patient is awake and alert, oriented to place and time. Memory subjective described as intact. There is a normal attention span & concentration ability. Speech is fluent without dysarthria, dysphonia  or aphasia. Mood and affect are appropriate..   Cranial nerves: Pupils are equal and briskly reactive to light. Funduscopic exam without evidence of pallor or edema. Extraocular movements in vertical and horizontal planes intact and without nystagmus.  Visual fields by finger perimetry are intact. Hearing to finger rub intact. Facial sensation intact to fine touch. Facial motor strength is symmetric and tongue and uvula move midline. Tongue protrusion into either cheek is normal. Shoulder shrug is normal.  Motor exam: Normal tone, muscle bulk and symmetric strength in all extremities. Coordination: Rapid alternating movements in the fingers/hands were normal. Finger-to-nose maneuver normal without evidence of ataxia, dysmetria or tremor. Gait and station: Strength within normal limits. Stance is stable and normal. Tandem gait is unfragmented. Romberg testing is negative  Deep tendon reflexes: in the upper and  lower extremities are symmetric and intact. Babinski maneuver response is downgoing.   DIAGNOSTIC DATA (LABS, IMAGING, TESTING) - I reviewed patient records, labs, notes, testing and imaging myself where available.  Lab Results  Component Value Date   WBC 21.5* 08/31/2014   HGB 11.0* 08/31/2014   HCT 32.9* 08/31/2014   MCV 91.1 08/31/2014   PLT 179 08/31/2014      Component Value Date/Time   NA 133* 08/25/2014 0125   K 3.3* 08/25/2014 0125   CL 106 08/25/2014 0125   CO2 23 08/25/2014 0125   GLUCOSE 115* 08/25/2014 0125   BUN 6 08/25/2014 0125   CREATININE 0.71 08/25/2014 0125   CALCIUM 8.3* 08/25/2014 0125   PROT 5.5* 08/25/2014 0125   ALBUMIN 3.1* 08/25/2014 0125   AST 19 08/25/2014 0125   ALT 12* 08/25/2014 0125   ALKPHOS 128* 08/25/2014 0125   BILITOT 0.3 08/25/2014 0125   GFRNONAA >60 08/25/2014 0125   GFRAA >60 08/25/2014 0125    ASSESSMENT AND PLAN  38 y.o. year old female  returns for follow-up with  Increase in  daily headache, and 2 recent  migraines which lasted 6 hours each. She is wishing to go on a preventive medication as well as something acutely to take.The patient is a current patient of Dr. [name]  who is out of the office today . This note is sent to the work in doctor.     Try Topamax 25 mg at night for 1 week then increase by 25 mg weekly until dose is 100mg  or headache abates.  Relpax 40 mg  acutely Call if this medication is not working Discussed food triggers for headache F/U in 3 to 6 monthsVst time 20 min Dennie Bible, [facility], [facility], [facility] [address], [place], [place] [address] [phone]

## 2014-11-23 NOTE — [facility] Notes (Signed)
I agree with the assessment and plan as directed by NP .The patient is known to me .   [name], MD  

## 2015-02-21 ENCOUNTER — Ambulatory Visit: Payer: BLUE CROSS/BLUE SHIELD | Admitting: Nurse Practitioner

## 2015-02-26 ENCOUNTER — Encounter: Payer: Self-pay | Admitting: Nurse Practitioner

## 2015-02-26 ENCOUNTER — Ambulatory Visit (INDEPENDENT_AMBULATORY_CARE_PROVIDER_SITE_OTHER): Payer: BLUE CROSS/BLUE SHIELD | Admitting: Nurse Practitioner

## 2015-02-26 VITALS — BP 114/72 | HR 65 | Ht 69.0 in | Wt 173.4 lb

## 2015-02-26 DIAGNOSIS — R519 Headache, unspecified: Secondary | ICD-10-CM

## 2015-02-26 DIAGNOSIS — R51 Headache: Secondary | ICD-10-CM

## 2015-02-26 MED ORDER — VERAPAMIL HCL 40 MG PO TABS: 40.0000 mg | ORAL_TABLET | Freq: Every day | ORAL | Status: DC

## 2015-02-26 NOTE — Patient Instructions (Signed)
Verapamil 40 mg at bedtime as preventatives Follow-up in 3 months

## 2015-02-26 NOTE — [facility] Notes (Signed)
[facility]  PATIENT: Ellen Vasquez DOB: February 26, 1975   REASON FOR VISIT: Chronic intractable migraine HISTORY FROM: Patient    HISTORY OF PRESENT ILLNESS: 1/10/17Ms.Ellen Vasquez, 40 year old female returns for follow-up. She was last seen in the office 12/01/2014. She continues to have headaches. Her headaches are increased with stress and due to the holidays they were worse. In addition in her last visit she was placed on Topamax however before she obtained the drug she had several urinary tract infections and now she has 6 kidney stones. She obviously was told by urology not to start medication. She reports also that her birth control was changed about 2 weeks ago and she has not had a migraine in 2 weeks. Her headache pattern has not changed, she can be nauseated and vomiting with the headaches, sometimes she has a stabbing pain around her eye and numbness in her face. She'll have a visual aura lack of sleep is also a migraine trigger for her however her 6-month-old is now sleeping through the night. She returns for reevaluation   10/15/16[name], 40 year old female returns for follow-up she delivered a healthy baby girl on 08/30/2014. She has a history of migraines. She has had a chronic daily headache for over a month now and 2 migraine that lasted approximately 6 hours in the last 2 weeks. She is nauseated and can vomit with her headaches. Sometimes her face will go numb and she will have a visual aura. Stress and lack of sleep are her migraine triggers. Currently the baby is not sleeping during the night. She has been taken Motrin and Tylenol with little relief. She has tried nortriptyline as a preventive in the past however she gained 20 pounds on the medication and does not wish to use that at this time. She has used Imitrex in the past as a acute medication but it did not work the last couple of times she used it. 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 This patient is currently [redacted] weeks pregnant, in her third pregnancy. [name] had previously encountered migraines in her pregnancies but stated that with fertility treatments and the onset of the third pregnancy her migraines have a different quality and frequency to it. Most recently she has noticed that the migraines awaken her from sleep with severe associated with nausea, vomiting, the right side of her face with a throbbing quality above and behind the eye. The left face goes numb at the time the right face hurts.  She describes a visual aura with the exact lines and flashing lights. She also has some vertigo. She feels that the headaches take all her strengths out of her and less productive and certainly less functional. The spells last a couple of hours before the pain subsides. But there is a latent headache that can last days in the background. She has palpitations and a shortness of breath at night, too. She sleeps on 2-3 pillows. She only gets about 2-3 hours sleep at time. She has been prescribed Ambien by her OB but that makes her too drowsy during the day. She had a home sleep study that was negative for obstructive sleep apnea lowest O2 content was 92%, Dr. [name] reviewed those results with her. She returns for reevaluation With her second pregnancy at age 89 she had the first time encountered any migrainous headaches. She had other forms of headaches before and sometimes intensely painful headaches but not migrainous.  REVIEW OF SYSTEMS: Full 14 system review of systems performed and notable only for those listed, all others are neg:  Constitutional: Fatigue  Cardiovascular: neg Ear/Nose/Throat: neg  Skin: neg Eyes: Sensitivity to light Respiratory: neg Gastroitestinal: neg  Genitourinary: 6 kidney stones, UTIs 4 Hematology/Lymphatic: neg  Endocrine: neg Musculoskeletal:neg Allergy/Immunology:  neg Neurological: Headache, dizziness Psychiatric: Anxiety Sleep : Frequent waking   ALLERGIES: Allergies  Allergen Reactions  . Shellfish Allergy Hives  . Sulfa Antibiotics Dermatitis  . Other     Surgical glue causes a rash    HOME MEDICATIONS: Outpatient Prescriptions Prior to Visit  Medication Sig Dispense Refill  . eletriptan (RELPAX) 40 MG tablet Take 40 mg by mouth as needed for migraine or headache. May repeat in 2 hours if headache persists or recurs.    Marland Kitchen ibuprofen (ADVIL,MOTRIN) 600 MG tablet Take 1 tablet (600 mg total) by mouth every 6 (six) hours. 30 tablet 0  . JUNEL FE 1/20 1-20 MG-MCG tablet TAKE 2 TABLETS DAILY FOR 3 DAYS, THEN 1 TABLET DAILY THEREAFTER  0  . ranitidine (ZANTAC) 150 MG tablet Take 150 mg by mouth 2 (two) times daily.    . sertraline (ZOLOFT) 50 MG tablet Take 50 mg by mouth daily.    Marland Kitchen topiramate (TOPAMAX) 25 MG tablet Take 1 tablet (25 mg total) by mouth daily. 1 po every night for 1 week then increase by 25mg  every week until dose is 100mg  or the headache is gone. (Patient not taking: Reported on 02/26/2015) 120 tablet 3   No facility-administered medications prior to visit.    PAST MEDICAL HISTORY: Past Medical History  Diagnosis Date  . Headache   . Endometriosis 1997  . Otosclerosis 2008  . Chiari malformation type I ([place])     MRI [facility] 2013  . Anxiety   . Headache in pregnancy   . Depression     PPD with 2nd baby  . Kidney stones   . Kidney infection     PAST SURGICAL HISTORY: Past Surgical History  Procedure Laterality Date  . Wisdom tooth extraction  2008  . Shoulder surgery  2000  . Tonsillectomy and adenoidectomy  2010  . Laparoscopic abdominal exploration  06/2013    has had 4 surgeries  . Dilation and curettage of uterus  May 2015    FAMILY HISTORY: Family History  Problem Relation Age of Onset  . Asthma Father   . Cancer Maternal Grandmother     skin  . Hypertension Other   . Hyperlipidemia Other   . Stroke  Other   . Obesity Other   . Arthritis Father     Rheumatoid  . Aneurysm Paternal Grandfather     brain  . Skin cancer Paternal Grandmother     SOCIAL HISTORY: Social History   Social History  . Marital Status: Married    Spouse Name: N/A  . Number of Children: 2  . 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 Smoking status: Former Smoker    Types: Cigarettes    Quit date: 11/25/2001  . Smokeless tobacco: Never Used  . Alcohol Use: No  . Drug Use: No  . Sexual Activity: Yes    Birth Control/ Protection: None   Other Topics Concern  . Not on file   Social History Narrative   Caffeine none (occasional soda).     PHYSICAL EXAM  Filed Vitals:  02/26/15 1554  BP: 114/72  Pulse: 65  Height: 5\' 9"  (1.753 m)  Weight: 173 lb 6.4 oz (78.654 kg)   Body mass index is 25.6 kg/(m^2). General: The patient is awake, alert and appears not in acute distress. The patient is well groomed. Head: Normocephalic, atraumatic.  Neck is supple.  Skin: Without evidence of edema, or rash  Neurologic exam : The patient is awake and alert, oriented to place and time. Memory subjective described as intact. There is a normal attention span & concentration ability. Speech is fluent without dysarthria, dysphonia or aphasia. Mood and affect are appropriate..   Cranial nerves: Pupils are equal and briskly reactive to light. Extraocular movements in vertical and horizontal planes intact and without nystagmus.  Visual fields by finger perimetry are intact. Hearing to finger rub intact. Facial sensation intact to fine touch. Facial motor strength is symmetric and tongue and uvula move midline. Tongue protrusion into either cheek is normal. Shoulder shrug is normal.  Motor exam: Normal tone, muscle bulk and symmetric strength in all extremities. Coordination: Rapid alternating movements in the fingers/hands were  normal. Finger-to-nose maneuver normal without evidence of ataxia, dysmetria or tremor. Gait and station: Strength within normal limits. Stance is stable and normal. Tandem gait is unfragmented. Romberg testing is negative  Deep tendon reflexes: in the upper and lower extremities are symmetric and intact. Babinski maneuver response is downgoing.  DIAGNOSTIC DATA (LABS, IMAGING, TESTING) - I reviewed patient records, labs, notes, testing and imaging myself where available.  Lab Results  Component Value Date   WBC 21.5* 08/31/2014   HGB 11.0* 08/31/2014   HCT 32.9* 08/31/2014   MCV 91.1 08/31/2014   PLT 179 08/31/2014      Component Value Date/Time   NA 133* 08/25/2014 0125   K 3.3* 08/25/2014 0125   CL 106 08/25/2014 0125   CO2 23 08/25/2014 0125   GLUCOSE 115* 08/25/2014 0125   BUN 6 08/25/2014 0125   CREATININE 0.71 08/25/2014 0125   CALCIUM 8.3* 08/25/2014 0125   PROT 5.5* 08/25/2014 0125   ALBUMIN 3.1* 08/25/2014 0125   AST 19 08/25/2014 0125   ALT 12* 08/25/2014 0125   ALKPHOS 128* 08/25/2014 0125   BILITOT 0.3 08/25/2014 0125   GFRNONAA >60 08/25/2014 0125   GFRAA >60 08/25/2014 0125    ASSESSMENT AND PLAN  40 y.o. year old female  has a past medical history of Headache;  Chiari malformation type I ([place]); Anxiety; Headache in pregnancy; Depression; Kidney stones; and Kidney infection. here to follow-up for headaches.  Discussed with Dr. [name] Verapamil 40 mg at bedtime as preventative Follow-up in 3 months No triptans Dennie Bible, [facility], [facility], [facility] [address] [place], [place] [address] [phone]

## 2015-02-27 NOTE — Progress Notes (Signed)
I agree with the assessment and plan as directed by NP .The patient is known to me .   Delene Morais, MD  

## 2015-03-04 ENCOUNTER — Telehealth: Payer: Self-pay | Admitting: Nurse Practitioner

## 2015-03-04 NOTE — Telephone Encounter (Signed)
Patient called regarding a bad reaction she had to verapamil (CALAN) 40 MG tablet, nausea, diarrhea, body pain through trunk area, states she's better today but not a 100%.

## 2015-03-04 NOTE — Telephone Encounter (Signed)
Please check with Dr. [name] tomorrow about additional meds Thanks

## 2015-03-04 NOTE — Telephone Encounter (Signed)
I spoke to pt and she took her first dose of verapamil 40mg  po qhs Saturday 03-02-15, had some dizziness, then next morning had nausea, weakness, felt awful, diarrhea, body aches and pain.  She did not take Sunday.  Is feeling better today.   Only other change was taking doxycycline for 2 wks (this since Thursday 02-28-15). I see not drug interaction.   I relayed that Dr. [name] out of the office today.  Will forward to CM/NP.  She verbalized understanding.

## 2015-03-05 NOTE — Telephone Encounter (Signed)
I asked [name] after she reported the side effects to break the tablet in half tried 20 mg maybe even a quarter of a tablet. She is reluctant to use it during the workweek but she will tried on Friday night. She will call me next Monday with the results. Ellen Vasquez

## 2015-03-12 ENCOUNTER — Telehealth: Payer: Self-pay | Admitting: Nurse Practitioner

## 2015-03-12 NOTE — Telephone Encounter (Signed)
Patient is calling and states she has an unusual migraine today and would like to be worked in if possible today.  I was able to set her up 1/25 @9 :30 but she still requests today if possible.  Please call.  Thanks

## 2015-03-12 NOTE — Telephone Encounter (Addendum)
I spoke to pt.   She noted migraine last night, back, occipital constant, with some sharp shooting pains.  No meds. She tried  verapamil half dose per Dr. [name]  and still got sick, so is not taking this.  Will defer to Dr. [name] as pts been on mutliple meds.

## 2015-03-13 ENCOUNTER — Encounter: Payer: Self-pay | Admitting: Nurse Practitioner

## 2015-03-13 ENCOUNTER — Ambulatory Visit (INDEPENDENT_AMBULATORY_CARE_PROVIDER_SITE_OTHER): Payer: BLUE CROSS/BLUE SHIELD | Admitting: Nurse Practitioner

## 2015-03-13 VITALS — BP 122/80 | HR 75 | Ht 69.0 in | Wt 168.6 lb

## 2015-03-13 DIAGNOSIS — R51 Headache: Secondary | ICD-10-CM | POA: Diagnosis not present

## 2015-03-13 DIAGNOSIS — G43909 Migraine, unspecified, not intractable, without status migrainosus: Secondary | ICD-10-CM

## 2015-03-13 DIAGNOSIS — G44009 Cluster headache syndrome, unspecified, not intractable: Secondary | ICD-10-CM | POA: Diagnosis not present

## 2015-03-13 DIAGNOSIS — G8929 Other chronic pain: Secondary | ICD-10-CM

## 2015-03-13 MED ORDER — SUMATRIPTAN SUCCINATE 11 MG/NOSEPC NA EXHP: 1.0000 | INHALANT_POWDER | NASAL | Status: DC | PRN

## 2015-03-13 NOTE — Patient Instructions (Signed)
Onzetra 11 mg  Administer 1 nosepiece per nostril Text [phone] onzettra to get video MRI of the brain Referral to Dr. Jaynee Eagles for headaches

## 2015-03-13 NOTE — Telephone Encounter (Signed)
Please work her in

## 2015-03-13 NOTE — Progress Notes (Signed)
I agree with the assessment and plan as directed by NP .The patient is known to me .   Kema Santaella, MD  

## 2015-03-13 NOTE — Progress Notes (Signed)
[facility]  PATIENT: DEEANNA AXTMAN DOB: Dec 05, 1975   REASON FOR VISIT: Follow-up for acute  headache HISTORY FROM: Patient    HISTORY OF PRESENT ILLNESS:Ms Osborne, 40 year old female returns for follow-up. She was just seen in the office 02/26/2015 for headache. She was started on verapamil at that time however she said the medication caused her dizziness and nausea even at half the dose and a quarter of a dose. She stopped the medication due to the side effects. She is unable to take Topamax due to  Kidney stones. She currently has 3. She developed a new type of headache which was acute in the occipital area several nights ago with sharp pain which radiated downward and she describes this as a  gravitational pull down through her entire body. She denies any facial drooping or weakness however she does have some numbness on the left side face. She has never been imaged. With her headaches now she is having some dizziness  and bilateral tinnitus, which she claims she has never had before. She returns for reevaluation  02/26/15 CMMs.[place], 40 year old female returns for follow-up. She was last seen in the office 12/01/2014. She continues to have headaches. Her headaches are increased with stress and due to the holidays they were worse. In addition in her last visit she was placed on Topamax however before she obtained the drug she had several urinary tract infections and now she has 6 kidney stones. She obviously was told by urology not to start medication. She reports also that her birth control was changed about 2 weeks ago and she has not had a migraine in 2 weeks. Her headache pattern has not changed, she can be nauseated and vomiting with the headaches, sometimes she has a stabbing pain around her eye and numbness in her face. She'll have a visual aura lack of sleep is also a migraine trigger for her however her 25-month-old is now sleeping through the night. 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 Betti, 40 year old female returns for follow-up she delivered a healthy baby girl on 08/30/2014. She has a history of migraines. She has had a chronic daily headache for over a month now and 2 migraine that lasted approximately 6 hours in the last 2 weeks. She is nauseated and can vomit with her headaches. Sometimes her face will go numb and she will have a visual aura. Stress and lack of sleep are her migraine triggers. Currently the baby is not sleeping during the night. She has been taken Motrin and Tylenol with little relief. She has tried nortriptyline as a preventive in the past however she gained 20 pounds on the medication and does not wish to use that at this time. She has used Imitrex in the past as a acute medication but it did not work the last couple of times she used it. 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 This patient is currently [redacted] weeks pregnant, in her third pregnancy. [name] had previously encountered migraines in her pregnancies but stated that with fertility treatments and the onset of the third pregnancy her migraines have a different quality and frequency to it. Most recently she has noticed that the migraines awaken her from sleep with severe associated with nausea, vomiting, the right side of her face with a throbbing quality above and behind the eye. The left face goes numb at the time the right face hurts.  She describes a visual  aura with the exact lines and flashing lights. She also has some vertigo. She feels that the headaches take all her strengths out of her and less productive and certainly less functional. The spells last a couple of hours before the pain subsides. But there is a latent headache that can last days in the background. She has palpitations and a shortness of breath at night, too. She sleeps on 2-3 pillows. She only gets about 2-3 hours sleep at  time. She has been prescribed Ambien by her OB but that makes her too drowsy during the day. She had a home sleep study that was negative for obstructive sleep apnea lowest O2 content was 92%, Dr. [name] reviewed those results with her. She returns for reevaluation With her second pregnancy at age 73 she had the first time encountered any migrainous headaches. She had other forms of headaches before and sometimes intensely painful headaches but not migrainous.    REVIEW OF SYSTEMS: Full 14 system review of systems performed and notable only for those listed, all others are neg:  Constitutional: Fatigue  Cardiovascular: neg Ear/Nose/Throat: Ringing in the ears Skin: neg Eyes: Light sensitivity Respiratory: neg Gastroitestinal: neg  Hematology/Lymphatic: neg  Endocrine: neg Musculoskeletal: Pain and stiffness Allergy/Immunology: neg Neurological: Dizziness acute headache Psychiatric: Decreased concentration and anxiety Sleep : neg   ALLERGIES: Allergies  Allergen Reactions  . Shellfish Allergy Hives  . Sulfa Antibiotics Dermatitis  . Verapamil     GI sx, weakness  . Other     Surgical glue causes a rash    HOME MEDICATIONS: Outpatient Prescriptions Prior to Visit  Medication Sig Dispense Refill  . ibuprofen (ADVIL,MOTRIN) 600 MG tablet Take 1 tablet (600 mg total) by mouth every 6 (six) hours. 30 tablet 0  . ranitidine (ZANTAC) 150 MG tablet Take 150 mg by mouth 2 (two) times daily.    . sertraline (ZOLOFT) 50 MG tablet Take 50 mg by mouth daily.    . TRI-LO-SPRINTEC 0.18/0.215/0.25 MG-25 MCG tab Take 1 tablet by mouth daily.  11  . amoxicillin-clavulanate (AUGMENTIN) 875-125 MG tablet Take 1 tablet by mouth 2 (two) times daily. Reported on 03/13/2015  0  . JUNEL FE 1/20 1-20 MG-MCG tablet Reported on 03/13/2015  0  . tobramycin-dexamethasone (TOBRADEX) ophthalmic solution Reported on 03/13/2015  0  . verapamil (CALAN) 40 MG tablet Take 1 tablet (40 mg total) by mouth at  bedtime. (Patient not taking: Reported on 03/13/2015) 30 tablet 3   No facility-administered medications prior to visit.    PAST MEDICAL HISTORY: Past Medical History  Diagnosis Date  . Headache   . Endometriosis 1997  . Otosclerosis 2008  . Chiari malformation type I ([place])     MRI [facility] 2013  . Anxiety   . Headache in pregnancy   . Depression     PPD with 2nd baby  . Kidney stones   . Kidney infection     PAST SURGICAL HISTORY: Past Surgical History  Procedure Laterality Date  . Wisdom tooth extraction  2008  . Shoulder surgery  2000  . Tonsillectomy and adenoidectomy  2010  . Laparoscopic abdominal exploration  06/2013    has had 4 surgeries  . Dilation and curettage of uterus  May 2015    FAMILY HISTORY: Family History  Problem Relation Age of Onset  . Asthma Father   . Cancer Maternal Grandmother     skin  . Hypertension Other   . Hyperlipidemia Other   . Stroke Other   .  Obesity Other   . Arthritis Father     Rheumatoid  . Aneurysm Paternal Grandfather     brain  . Skin cancer Paternal Grandmother     SOCIAL HISTORY: Social History   Social History  . Marital Status: Married    Spouse Name: N/A  . Number of Children: 2  . 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 Smoking status: Former Smoker    Types: Cigarettes    Quit date: 11/25/2001  . Smokeless tobacco: Never Used  . Alcohol Use: No  . Drug Use: No  . Sexual Activity: Yes    Birth Control/ Protection: None   Other Topics Concern  . Not on file   Social History Narrative   Caffeine none (occasional soda).     PHYSICAL EXAM  Filed Vitals:   03/13/15 0921  BP: 122/80  Pulse: 75  Height: 5\' 9"  (1.753 m)  Weight: 168 lb 9.6 oz (76.476 kg)   Body mass index is 24.89 kg/(m^2). General: The patient is awake, alert and appears not in acute distress. The patient is well groomed. Head: Normocephalic,  atraumatic.  Neck is supple.  Skin: Without evidence of edema, or rash  Neurologic exam : The patient is awake and alert, oriented to place and time. Memory subjective described as intact. There is a normal attention span & concentration ability. Speech is fluent without dysarthria, dysphonia or aphasia. Mood and affect are appropriate..   Cranial nerves: Pupils are equal and briskly reactive to light. Extraocular movements in vertical and horizontal planes intact and without nystagmus.  Visual fields by finger perimetry are intact. Hearing to finger rub intact. Facial sensation intact to fine touch. Facial motor strength is symmetric and tongue and uvula move midline. Tongue protrusion into either cheek is normal. Shoulder shrug is normal.  Motor exam: Normal tone, muscle bulk and symmetric strength in all extremities. Coordination: Rapid alternating movements in the fingers/hands were normal. Finger-to-nose maneuver normal without evidence of ataxia, dysmetria or tremor. Gait and station: Strength within normal limits. Stance is stable and normal. Tandem gait is unfragmented. Romberg testing is negative  Deep tendon reflexes: in the upper and lower extremities are symmetric and intact. Babinski maneuver response is downgoing.  DIAGNOSTIC DATA (LABS, IMAGING, TESTING) - I reviewed patient records, labs, notes, testing and imaging myself where available.  Lab Results  Component Value Date   WBC 21.5* 08/31/2014   HGB 11.0* 08/31/2014   HCT 32.9* 08/31/2014   MCV 91.1 08/31/2014   PLT 179 08/31/2014      Component Value Date/Time   NA 133* 08/25/2014 0125   K 3.3* 08/25/2014 0125   CL 106 08/25/2014 0125   CO2 23 08/25/2014 0125   GLUCOSE 115* 08/25/2014 0125   BUN 6 08/25/2014 0125   CREATININE 0.71 08/25/2014 0125   CALCIUM 8.3* 08/25/2014 0125   PROT 5.5* 08/25/2014 0125   ALBUMIN 3.1* 08/25/2014 0125   AST 19 08/25/2014 0125   ALT 12* 08/25/2014 0125    ALKPHOS 128* 08/25/2014 0125   BILITOT 0.3 08/25/2014 0125   GFRNONAA >60 08/25/2014 0125   GFRAA >60 08/25/2014 0125      ASSESSMENT AND PLAN  40 y.o. year old female  has a past medical history of Headache; Endometriosis (1997); Otosclerosis (2008); Chiari malformation type I ([place]); Anxiety; Headache in pregnancy; Depression; Kidney stones; and Kidney infection. here to follow-up  for acute headache. She tried verapamil several weeks ago and had dizziness and nausea on the medication even at half the dose and quarter dose. She failed Topamax due to kidney stones. She has never been imaged due to being pregnant when initially evaluated  Plan seen in consult with Dr. [name] Will try [place] to use acutely demonstration was given Onzetra 11 mg  Administer 1 nosepiece per nostril Text [phone] onzettra to get video MRI of the brain with and without contrast Referral to Dr. Jaynee Eagles for headaches Vst time 25 min Dennie Bible, [facility], [facility], [facility] [address], [place], [place] [address] [phone]

## 2015-03-19 ENCOUNTER — Telehealth: Payer: Self-pay | Admitting: *Deleted

## 2015-03-19 NOTE — Telephone Encounter (Signed)
[facility] approved Xsail nasal powder.  03-18-15 thru 09-15-15,  PA # [id number] AB.  [phone].

## 2015-03-27 ENCOUNTER — Ambulatory Visit (INDEPENDENT_AMBULATORY_CARE_PROVIDER_SITE_OTHER): Payer: BLUE CROSS/BLUE SHIELD

## 2015-03-27 DIAGNOSIS — G43909 Migraine, unspecified, not intractable, without status migrainosus: Secondary | ICD-10-CM

## 2015-03-27 DIAGNOSIS — G8929 Other chronic pain: Secondary | ICD-10-CM

## 2015-03-27 DIAGNOSIS — R51 Headache: Secondary | ICD-10-CM

## 2015-03-27 DIAGNOSIS — G44009 Cluster headache syndrome, unspecified, not intractable: Secondary | ICD-10-CM | POA: Diagnosis not present

## 2015-03-27 MED ORDER — GADOPENTETATE DIMEGLUMINE 469.01 MG/ML IV SOLN: 15.0000 mL | Freq: Once | INTRAVENOUS | Status: AC | PRN

## 2015-03-29 ENCOUNTER — Telehealth: Payer: Self-pay | Admitting: *Deleted

## 2015-03-29 NOTE — Telephone Encounter (Signed)
I called pt and relayed that MRI brain showed no acute findings.  Has appt with Dr. Jaynee Eagles on Monday. 04-01-15.

## 2015-03-29 NOTE — Telephone Encounter (Signed)
-----   Message from Dennie Bible, NP sent at 03/29/2015 10:49 AM EST ----- No acute findings on MRI reviewed with Dr. [name]. We have made a referral to Dr. Jaynee Eagles who specializes in headaches. Please call

## 2015-04-01 ENCOUNTER — Ambulatory Visit (INDEPENDENT_AMBULATORY_CARE_PROVIDER_SITE_OTHER): Payer: BLUE CROSS/BLUE SHIELD | Admitting: Neurology

## 2015-04-01 ENCOUNTER — Encounter: Payer: Self-pay | Admitting: Neurology

## 2015-04-01 VITALS — BP 116/70 | HR 69 | Ht 66.0 in | Wt 169.0 lb

## 2015-04-01 DIAGNOSIS — G43711 Chronic migraine without aura, intractable, with status migrainosus: Secondary | ICD-10-CM

## 2015-04-01 MED ORDER — SERTRALINE HCL 100 MG PO TABS: 100.0000 mg | ORAL_TABLET | Freq: Every day | ORAL | Status: DC

## 2015-04-01 MED ORDER — PROPRANOLOL HCL 10 MG PO TABS: 10.0000 mg | ORAL_TABLET | Freq: Three times a day (TID) | ORAL | Status: DC

## 2015-04-01 NOTE — Progress Notes (Signed)
[facility]    Provider:  Dr Jaynee Eagles Referring Provider: Aretta Nip, MD Primary Care Physician:  Aretta Nip, MD  CC:  Chronic intractable headaches  HPI:  Ellen Vasquez is a 40 y.o. female here as a referral from Dr. Radene Ou for chronic hedaches. I'm seein gher for the first time, she has been seeing Cecille Rubin in our clinic. She has a chiari malformation and she has been evaluated at [facility] and no neurosurgical intervention was needed. Migraines started in teenage years. Migraines with the visual disturbances in her 50s. She has 30 headache days a month, she has at least 15 migraines a month. Daily headaches are are unilateral on the left like a screw going through her eye, stabbing. The migraines are on the right side, then spreads to the whole head, pain in the neck traveling to the cervical area, continuous squeezing, she feels like she is being pulled to the right, she has to go into a dark room and lay down which helps, sound sensitivity, nausea and vomiitng. Can last days and can be 10/10. It has been at this frequency for years. Stress is a trigger. No food triggers. No medication overuse.   Reviewed notes, labs and imaging from outside physicians, which showed:   IMPRESSION: This MRI of the brain with and without contrast shows the following: 1. Chiari type I malformation with 8 mm of cerebellar ectopia. Tonsils are rounded and there does not appear to be any medullary compression. 2. There are no acute findings.   Personally reviewed images and agree with the above.  Cbc 08/2014 with anemia 11/32.5  From a review of records, she has tried and failed several first-line medications for migraines: verapamil - had side effects, Topamax - C/I due to nephrolithiasis, Nortriptyline- had side effects of excess weight gain on a very low dose.  She was recently prescribed Onzetra and hasn't tried it yet for acute management. She tried  sumatriptan pills with severe nausea and vomited.    Review of Systems: Patient complains of symptoms per HPI as well as the following symptoms: anxiety, decreased energy, joint pain, cramps. Pertinent negatives per HPI. All others negative.   Social History   Social History  . Marital Status: Married    Spouse Name: N/A  . Number of Children: 2  . 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 Smoking status: Former Smoker    Types: Cigarettes    Quit date: 11/25/2001  . Smokeless tobacco: Never Used  . Alcohol Use: No  . Drug Use: No  . Sexual Activity: Yes    Birth Control/ Protection: None   Other Topics Concern  . Not on file   Social History Narrative   Caffeine none (occasional soda).    Family History  Problem Relation Age of Onset  . Asthma Father   . Arthritis Father     Rheumatoid  . Cancer Maternal Grandmother     skin  . Hypertension Other   . Hyperlipidemia Other   . Stroke Other   . Obesity Other   . Aneurysm Paternal Grandfather     brain  . Skin cancer Paternal Grandmother   . Migraines Neg Hx     Past Medical History  Diagnosis Date  . Headache   . Endometriosis 1997  . Otosclerosis 2008  . Chiari malformation type I ([place])  MRI [facility] 2013  . Anxiety   . Headache in pregnancy   . Depression     PPD with 2nd baby  . Kidney stones   . Kidney infection     Past Surgical History  Procedure Laterality Date  . Wisdom tooth extraction  2008  . Shoulder surgery  2000  . Tonsillectomy and adenoidectomy  2010  . Laparoscopic abdominal exploration  06/2013    has had 4 surgeries  . Dilation and curettage of uterus  May 2015    Current Outpatient Prescriptions  Medication Sig Dispense Refill  . ibuprofen (ADVIL,MOTRIN) 600 MG tablet Take 1 tablet (600 mg total) by mouth every 6 (six) hours. 30 tablet 0  . ranitidine (ZANTAC) 150 MG tablet Take 150 mg by mouth 2 (two)  times daily.    . TRI-LO-SPRINTEC 0.18/0.215/0.25 MG-25 MCG tab Take 1 tablet by mouth daily.  11  . propranolol (INDERAL) 10 MG tablet Take 1 tablet (10 mg total) by mouth 3 (three) times daily. 90 tablet 12  . sertraline (ZOLOFT) 100 MG tablet Take 1 tablet (100 mg total) by mouth daily. 30 tablet 12  . SUMAtriptan Succinate (ONZETRA XSAIL) 11 MG/NOSEPC EXHP Place 1 Dose into both nostrils as needed (administer 2 nose pieces (1 per nostril)at onset of migraine and can repeat in 2 hours). At onset of acute migraine each nostril (Patient not taking: Reported on 04/01/2015) 1 each 11   No current facility-administered medications for this visit.   Facility-Administered Medications Ordered in Other Visits  Medication Dose Route Frequency Provider Last Rate Last Dose  . gadopentetate dimeglumine (MAGNEVIST) injection 15 mL  15 mL Intravenous Once PRN Larey Seat, MD        Allergies as of 04/01/2015 - Review Complete 04/01/2015  Allergen Reaction Noted  . Shellfish allergy Hives 01/04/2013  . Sulfa antibiotics Dermatitis 06/12/2014  . Verapamil  03/13/2015  . Other  06/12/2014    Vitals: BP 116/70 mmHg  Pulse 69  Ht 5\' 6"  (1.676 m)  Wt 169 lb (76.658 kg)  BMI 27.29 kg/m2 Last Weight:  Wt Readings from Last 1 Encounters:  04/01/15 169 lb (76.658 kg)   Last Height:   Ht Readings from Last 1 Encounters:  04/01/15 5\' 6"  (1.676 m)     Physical exam: Exam: Gen: NAD, conversant, well nourised,  well groomed                     CV: RRR, no MRG. No Carotid Bruits. No peripheral edema, warm, nontender Eyes: Conjunctivae clear without exudates or hemorrhage  Neuro: Detailed Neurologic Exam  Speech:    Speech is normal; fluent and spontaneous with normal comprehension.  Cognition:    The patient is oriented to person, place, and time;     recent and remote memory intact;     language fluent;     normal attention, concentration,     fund of knowledge Cranial Nerves:    The  pupils are equal, round, and reactive to light. The fundi are normal and spontaneous venous pulsations are present. Visual fields are full to finger confrontation. Extraocular movements are intact. Trigeminal sensation is intact and the muscles of mastication are normal. The face is symmetric. The palate elevates in the midline. Hearing intact. Voice is normal. Shoulder shrug is normal. The tongue has normal motion without fasciculations.   Coordination:    Normal finger to nose and heel to shin. Normal rapid alternating movements.   Gait:  Heel-toe and tandem gait are normal.   Motor Observation:    No asymmetry, no atrophy, and no involuntary movements noted. Tone:    Normal muscle tone.    Posture:    Posture is normal. normal erect    Strength:    Strength is V/V in the upper and lower limbs.      Sensation: intact to LT     Reflex Exam:  DTR's:    Deep tendon reflexes in the upper and lower extremities are normal bilaterally.   Toes:    The toes are downgoing bilaterally.   Clonus:    Clonus is absent.     Assessment/Plan:  40 year old female with chronic migraines.   Increase Zoloft 100mg  Propranolol 10mg  three times. If she tolerates can change to ER. Do not get pregnant on this medication. We discussed labetalol instead during pregnancy, can discuss again if she gets decides to try and get pregnant again. Side effects can include bradycardia, heartblock,MI,arrhythmia,bronchospasm but more common side effects include fatigue, dizziness, hypotension and low HR, insomnia, depression, but stop for anything concerning at all. Take a weekend when not driving and at home with your husband. botox for rmigraine: discussed at length, risks, procedure, expectations, studies Will check a bmp  Sarina Ill, MD  [facility] [address] [place], [place] [address]  Phone [phone] Fax [phone]  A total of 45 minutes was spent  face-to-face with this patient. Over half this time was spent on counseling patient on the migraine diagnosis and different diagnostic and therapeutic options available.

## 2015-04-01 NOTE — Patient Instructions (Signed)
Remember to drink plenty of fluid, eat healthy meals and do not skip any meals. Try to eat protein with a every meal and eat a healthy snack such as fruit or nuts in between meals. Try to keep a regular sleep-wake schedule and try to exercise daily, particularly in the form of walking, 20-30 minutes a day, if you can.   As far as your medications are concerned, I would like to suggest: Botox Propranolol 10mg  three times a day Increased Zoloft  I would like to see you back for botox, sooner if we need to. Please call us with any interim questions, concerns, problems, updates or refill requests.   Please also call us for any test results so we can go over those with you on the phone.  My clinical assistant and will answer any of your questions and relay your messages to me and also relay most of my messages to you.   Our phone number is [phone]. We also have an after hours call service for urgent matters and there is a physician on-call for urgent questions. For any emergencies you know to call 911 or go to the nearest emergency room

## 2015-05-29 ENCOUNTER — Other Ambulatory Visit: Payer: Self-pay

## 2015-05-29 MED ORDER — SERTRALINE HCL 100 MG PO TABS: 100.0000 mg | ORAL_TABLET | Freq: Every day | ORAL | Status: DC

## 2015-06-03 ENCOUNTER — Ambulatory Visit: Payer: BLUE CROSS/BLUE SHIELD | Admitting: Nurse Practitioner

## 2015-06-04 ENCOUNTER — Encounter: Payer: Self-pay | Admitting: Nurse Practitioner

## 2015-06-04 ENCOUNTER — Ambulatory Visit (INDEPENDENT_AMBULATORY_CARE_PROVIDER_SITE_OTHER): Payer: BLUE CROSS/BLUE SHIELD | Admitting: Nurse Practitioner

## 2015-06-04 VITALS — BP 130/80 | HR 76 | Resp 20 | Ht 69.0 in | Wt 163.0 lb

## 2015-06-04 DIAGNOSIS — G43719 Chronic migraine without aura, intractable, without status migrainosus: Secondary | ICD-10-CM | POA: Diagnosis not present

## 2015-06-04 DIAGNOSIS — G44009 Cluster headache syndrome, unspecified, not intractable: Secondary | ICD-10-CM | POA: Diagnosis not present

## 2015-06-04 DIAGNOSIS — G43909 Migraine, unspecified, not intractable, without status migrainosus: Secondary | ICD-10-CM | POA: Insufficient documentation

## 2015-06-04 NOTE — Progress Notes (Addendum)
[facility]  PATIENT: Ellen Vasquez DOB: 1975/11/27   REASON FOR VISIT: follow up for migraine [place] Dao is a 40 y.o. female to follow up  for chronic headaches.  She has a chiari malformation and she has been evaluated at [facility] and no neurosurgical intervention was needed. Migraines started in teenage years. Migraines with the visual disturbances in her 27s. She has 30 headache days a month, she has at least 15 migraines a month. Daily headaches are  unilateral on the left like a screw going through her eye, stabbing. The migraines are on the right side, then spreads to the whole head, pain in the neck traveling to the cervical area, continuous squeezing, she feels like she is being pulled to the right, she has to go into a dark room and lay down which helps, sound sensitivity, nausea and vomiitng. Can last days and can be 10/10. It has been at this frequency for years. Stress is a trigger. No food triggers. No medication overuse. From a review of records, she has tried and failed several first-line medications for migraines: verapamil - had side effects, Topamax - C/I due to nephrolithiasis, Nortriptyline- had side effects of excess weight gain on a very low dose. She was recently prescribed [facility] and states it works. She tried sumatriptan pills with severe nausea and vomited. She stopped propanolol. Her headache today is due to stress and  she has a function to go to at the college tonight. She returns for reevaluation. She thought she was going to be getting Botox today, prior auth started at last visit with Dr. Jaynee Eagles on 04/01/2015. She does want to  discuss Botox and my thoughts on this.   REVIEW OF SYSTEMS: Full 14 system review of systems performed and notable only for those listed, all others are neg:  Constitutional: neg  Cardiovascular: neg Ear/Nose/Throat: neg  Skin: neg Eyes: Light  sensitivity Respiratory: neg Gastroitestinal: Nausea  Hematology/Lymphatic: neg  Endocrine: neg Musculoskeletal: neck pain neck stiffness Allergy/Immunology: Environmental allergies Neurological: Headache dizziness  Psychiatric: Anxiety Sleep : neg   ALLERGIES: Allergies  Allergen Reactions  . Shellfish Allergy Hives  . Sulfa Antibiotics Dermatitis  . Verapamil     GI sx, weakness  . Other     Surgical glue causes a rash    HOME MEDICATIONS: Outpatient Prescriptions Prior to Visit  Medication Sig Dispense Refill  . ibuprofen (ADVIL,MOTRIN) 600 MG tablet Take 1 tablet (600 mg total) by mouth every 6 (six) hours. 30 tablet 0  . sertraline (ZOLOFT) 100 MG tablet Take 1 tablet (100 mg total) by mouth daily. 90 tablet 4  . SUMAtriptan Succinate (ONZETRA XSAIL) 11 MG/NOSEPC EXHP Place 1 Dose into both nostrils as needed (administer 2 nose pieces (1 per nostril)at onset of migraine and can repeat in 2 hours). At onset of acute migraine each nostril 1 each 11  . TRI-LO-SPRINTEC 0.18/0.215/0.25 MG-25 MCG tab Take 1 tablet by mouth daily.  11  . propranolol (INDERAL) 10 MG tablet Take 1 tablet (10 mg total) by mouth 3 (three) times daily. 90 tablet 12  . ranitidine (ZANTAC) 150 MG tablet Take 150 mg by mouth 2 (two) times daily.     Facility-Administered Medications Prior to Visit  Medication Dose Route Frequency Provider Last Rate Last Dose  . gadopentetate dimeglumine (MAGNEVIST) injection 15 mL  15 mL Intravenous Once PRN Larey Seat, MD  PAST MEDICAL HISTORY: Past Medical History  Diagnosis Date  . Headache   . Endometriosis 1997  . Otosclerosis 2008  . Chiari malformation type I ([place])     MRI [facility] 2013  . Anxiety   . Headache in pregnancy   . Depression     PPD with 2nd baby  . Kidney stones   . Kidney infection     PAST SURGICAL HISTORY: Past Surgical History  Procedure Laterality Date  . Wisdom tooth extraction  2008  . Shoulder surgery  2000  .  Tonsillectomy and adenoidectomy  2010  . Laparoscopic abdominal exploration  06/2013    has had 4 surgeries  . Dilation and curettage of uterus  May 2015    FAMILY HISTORY: Family History  Problem Relation Age of Onset  . Asthma Father   . Arthritis Father     Rheumatoid  . Cancer Maternal Grandmother     skin  . Hypertension Other   . Hyperlipidemia Other   . Stroke Other   . Obesity Other   . Aneurysm Paternal Grandfather     brain  . Skin cancer Paternal Grandmother   . Migraines Neg Hx     SOCIAL HISTORY: Social History   Social History  . Marital Status: Married    Spouse Name: N/A  . Number of Children: 2  . 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 Smoking status: Former Smoker    Types: Cigarettes    Quit date: 11/25/2001  . Smokeless tobacco: Never Used  . Alcohol Use: No  . Drug Use: No  . Sexual Activity: Yes    Birth Control/ Protection: None   Other Topics Concern  . Not on file   Social History Narrative   Caffeine none (occasional soda).     PHYSICAL EXAM  Filed Vitals:   06/04/15 1520  BP: 130/80  Pulse: 76  Resp: 20  Height: 5\' 9"  (1.753 m)  Weight: 163 lb (73.936 kg)   Body mass index is 24.06 kg/(m^2). General: The patient is awake, alert and appears not in acute distress. The patient is well groomed. Head: Normocephalic, atraumatic.  Neck is supple.  Skin: Without evidence of edema, or rash  Neurologic exam : The patient is awake and alert, oriented to place and time. Memory subjective described as intact. There is a normal attention span & concentration ability. Speech is fluent without dysarthria, dysphonia or aphasia. Mood and affect are appropriate..   Cranial nerves: Pupils are equal and briskly reactive to light. Extraocular movements in vertical and horizontal planes intact and without nystagmus. Visual fields by finger perimetry are  intact. Hearing to finger rub intact. Facial sensation intact to fine touch. Facial motor strength is symmetric and tongue and uvula move midline. Tongue protrusion into either cheek is normal. Shoulder shrug is normal.  Motor exam: Normal tone, muscle bulk and symmetric strength in all extremities. Coordination: Rapid alternating movements in the fingers/hands were normal. Finger-to-nose maneuver normal without evidence of ataxia, dysmetria or tremor. Gait and station: Strength within normal limits. Stance is stable and normal. Tandem gait is unfragmented. Romberg testing is negative  Deep tendon reflexes: in the upper and lower extremities are symmetric and intact. Babinski maneuver response is downgoing.  DIAGNOSTIC DATA (LABS, IMAGING, TESTING) -   ASSESSMENT AND PLAN  40 y.o. year old female  has a past medical history of  chronic headaches  Chiari malformation type I ([place]); Anxiety; Headache in pregnancy; Depression; Kidney stones; here to follow-up. She has dropped her propanolol since last seen due to side effects. She is continuing Zoloft 100 mg daily which is beneficial.   PLAN: Continue Zoloft 100 daily Continue Ozetra when necessary acute migraine . Will set up for Botox with Dr. Jaynee Eagles, made patient aware I feel this is a good option for her since she has failed so many medications Dennie Bible, [facility], [facility], APRN  [facility] [address], [place], [place] [address] [phone]  Personally participated in and made any corrections needed to history, physical, neuro exam,assessment and plan as stated above.  I have personally evaluated lab data, reviewed imaging studies and agree with radiology interpretations.    Sarina Ill, MD [facility]

## 2015-06-04 NOTE — Patient Instructions (Signed)
Will set up for Botox with dr. Jaynee Eagles

## 2015-06-07 DIAGNOSIS — K5909 Other constipation: Secondary | ICD-10-CM | POA: Diagnosis not present

## 2015-06-07 DIAGNOSIS — N200 Calculus of kidney: Secondary | ICD-10-CM | POA: Diagnosis not present

## 2015-06-07 DIAGNOSIS — Z0000 Encounter for general adult medical examination without abnormal findings: Secondary | ICD-10-CM | POA: Diagnosis not present

## 2015-07-01 ENCOUNTER — Telehealth: Payer: Self-pay | Admitting: Neurology

## 2015-07-01 NOTE — Telephone Encounter (Signed)
LVM for Sherril Cong from [employer] [facility] [employer]) to call back. Gave [facility] phone number. Pt information faxed to her on 03/14/15 with rx. Spoke to pt pharmacy and they have no rx on file. States pt has never filled rx at pharmacy.

## 2015-07-01 NOTE — Telephone Encounter (Signed)
Pt called needing Prior auth on botox. She had to cancel her Thursday appt. Due to not having one.  Patient requesting refill of SUMAtriptan Succinate (ONZETRA XSAIL) 11 MG/NOSEPC [place]: [facility]. [id number], Phone: [phone]

## 2015-07-01 NOTE — Telephone Encounter (Signed)
LVM for pt relaying information below about onzetra. Also relayed I spoke to [employer]. Who is going to call her and work out her botox appt/PA. Asked her to call back to let me know if she has been getting onzetra, ect. I called her pharmacy and they told me they did not have rx on file. Gave [facility] phone number.

## 2015-07-02 NOTE — Telephone Encounter (Signed)
LVM for Sherril Cong from [employer] [facility]) to call back again. Gave [facility] phone number.

## 2015-07-02 NOTE — Telephone Encounter (Signed)
Called and spoke to Amy at [employer]. Advised I have been trying to reach [facility] yesterday and today. LVMx2. Have not been able to reach her. Amy is going to try and reach out to her and have her call us.

## 2015-07-03 ENCOUNTER — Telehealth: Payer: Self-pay | Admitting: *Deleted

## 2015-07-03 NOTE — Telephone Encounter (Signed)
See other phone note from 07/03/15. [place] called office back.

## 2015-07-03 NOTE — Telephone Encounter (Signed)
Called Sherril Cong back. Verified she did send rx onzetra to her pharmacy back on 03/13/15. I verified she had the correct pharmacy on file. She stated pt was enrolled in the [facility] program and was given 30 day free. She called pt to discuss, but pt was driving. She asked pt call her back to discuss once she reached her stopping point. I advised pt was requesting to go through specialty pharmacy and she may want to ask her about this. Opal Sidles stated her copay without savings card is about 150 dollars. Pt does not have copay card. She is going to try and get pt card to bring her cost to about 25 dollars for her copay. I also advised I could leave a copay card up front for pt pick-up at our office if this is easier for the pt. I asked her to let me know and I can place up front. She stated she will wait for pt to call her and then let me know what pt wants to do. Opal Sidles also stated she spoke to pharmacy and they have everything they need on file.

## 2015-07-03 NOTE — Telephone Encounter (Signed)
[name] AHERN/MARTI [place] [name]

## 2015-07-04 ENCOUNTER — Ambulatory Visit: Payer: BLUE CROSS/BLUE SHIELD | Admitting: Neurology

## 2015-07-04 ENCOUNTER — Telehealth: Payer: Self-pay | Admitting: Neurology

## 2015-07-04 NOTE — Telephone Encounter (Signed)
Patient called and canceled botox apt and informed us that PA was needed for medication. I called her and told her that I had obtained the PA and they were going to be reaching out to her for consent. We agreed upon me calling her back once we had a delivery date scheduled for the medication and we would reschedule her apt. At that time. No need to call.

## 2015-07-09 NOTE — Telephone Encounter (Signed)
[facility] [phone] called to confirm that our office will be open Thursday 5/25 AM for delivery of BOTOX by [employer].

## 2015-09-05 ENCOUNTER — Telehealth: Payer: Self-pay | Admitting: Neurology

## 2015-09-05 NOTE — Telephone Encounter (Signed)
Pt called in to see if her botox shippment has come in and if so she would like to schedule appt.

## 2015-09-11 NOTE — Telephone Encounter (Signed)
Called and scheduled patient

## 2015-09-12 ENCOUNTER — Ambulatory Visit (INDEPENDENT_AMBULATORY_CARE_PROVIDER_SITE_OTHER): Payer: BLUE CROSS/BLUE SHIELD | Admitting: Neurology

## 2015-09-12 DIAGNOSIS — G43711 Chronic migraine without aura, intractable, with status migrainosus: Secondary | ICD-10-CM | POA: Diagnosis not present

## 2015-09-12 NOTE — Progress Notes (Signed)
Consent Form Botulism Toxin Injection For Chronic Migraine  Botulism toxin has been approved by the Federal drug administration for treatment of chronic migraine. Botulism toxin does not cure chronic migraine and it may not be effective in some patients.  The administration of botulism toxin is accomplished by injecting a small amount of toxin into the muscles of the neck and head. Dosage must be titrated for each individual. Any benefits resulting from botulism toxin tend to wear off after 3 months with a repeat injection required if benefit is to be maintained. Injections are usually done every 3-4 months with maximum effect peak achieved by about 2 or 3 weeks. Botulism toxin is expensive and you should be sure of what costs you will incur resulting from the injection.  The side effects of botulism toxin use for chronic migraine may include:   -Transient, and usually mild, facial weakness with facial injections  -Transient, and usually mild, head or neck weakness with head/neck injections  -Reduction or loss of forehead facial animation due to forehead muscle              weakness  -Eyelid drooping  -Dry eye  -Pain at the site of injection or bruising at the site of injection  -Double vision  -Potential unknown long term risks  Contraindications: You should not have Botox if you are pregnant, nursing, allergic to albumin, have an infection, skin condition, or muscle weakness at the site of the injection, or have myasthenia gravis, Lambert-Eaton syndrome, or ALS.  It is also possible that as with any injection, there may be an allergic reaction or no effect from the medication. Reduced effectiveness after repeated injections is sometimes seen and rarely infection at the injection site may occur. All care will be taken to prevent these side effects. If therapy is given over a long time, atrophy and wasting in the muscle injected may occur. Occasionally the patient's become refractory to  treatment because they develop antibodies to the toxin. In this event, therapy needs to be modified.  I have read the above information and consent to the administration of botulism toxin.  Reviewed with patient today before procedure, sig on file  ______________  _____   _________________  Patient signature     Date   Witness signature       BOTOX PROCEDURE NOTE FOR MIGRAINE HEADACHE    Contraindications and precautions discussed with patient(above). Aseptic procedure was observed and patient tolerated procedure. Procedure performed by Dr. Georgia Dom  The condition has existed for more than 6 months, and pt does not have a diagnosis of ALS, Myasthenia Gravis or Lambert-Eaton Syndrome. Risks and benefits of injections discussed and pt agrees to proceed with the procedure. Written consent obtained  These injections are medically necessary. He receives good benefits from these injections. These injections do not cause sedations or hallucinations which the oral therapies may cause.  Indication/Diagnosis: chronic migraine BOTOX(J0585) injection was performed according to protocol by [employer]. 200 units of BOTOX was dissolved into 4 cc NS.  NDC: [id number]  Botox-100unitsx2 vials Lot: [id number] Expiration: 12/2017 [id number]  0.9% Sodium Chloride- 77mL total [id number] Expiration: 12/2016 NDC: [id number]   Description of procedure:  The patient was placed in a sitting position. The standard protocol was used for Botox as follows, with 5 units of Botox injected at each site:   -Procerus muscle, midline injection  -Corrugator muscle, bilateral injection  -Frontalis muscle, bilateral injection, with 2 sites each side, medial injection was performed in the  upper one third of the frontalis muscle, in the region vertical from the medial inferior edge of the superior orbital rim. The lateral injection was again in the upper one third of the forehead vertically above the  lateral limbus of the cornea, 1.5 cm lateral to the medial injection site.  -Temporalis muscle injection, 4 sites, bilaterally. The first injection was 3 cm above the tragus of the ear, second injection site was 1.5 cm to 3 cm up from the first injection site in line with the tragus of the ear. The third injection site was 1.5-3 cm forward between the first 2 injection sites. The fourth injection site was 1.5 cm posterior to the second injection site.  -Occipitalis muscle injection, 3 sites, bilaterally. The first injection was done one half way between the occipital protuberance and the tip of the mastoid process behind the ear. The second injection site was done lateral and superior to the first, 1 fingerbreadth from the first injection. The third injection site was 1 fingerbreadth superiorly and medially from the first injection site.  -Cervical paraspinal muscle injection, 2 sites, bilateral knee first injection site was 1 cm from the midline of the cervical spine, 3 cm inferior to the lower border of the occipital protuberance. The second injection site was 1.5 cm superiorly and laterally to the first injection site.  -Trapezius muscle injection was performed at 3 sites, bilaterally. The first injection site was in the upper trapezius muscle halfway between the inflection point of the neck, and the acromion. The second injection site was one half way between the acromion and the first injection site. The third injection was done between the first injection site and the inflection point of the neck.   Will return for repeat injection in 3 months.   A 200 unit sof Botox was used, 155 units were injected, the rest of the Botox was wasted. The patient tolerated the procedure well, there were no complications of the above procedure.

## 2015-10-23 DIAGNOSIS — Z6824 Body mass index (BMI) 24.0-24.9, adult: Secondary | ICD-10-CM | POA: Diagnosis not present

## 2015-10-23 DIAGNOSIS — Z01419 Encounter for gynecological examination (general) (routine) without abnormal findings: Secondary | ICD-10-CM | POA: Diagnosis not present

## 2015-11-15 ENCOUNTER — Telehealth: Payer: Self-pay | Admitting: Neurology

## 2015-11-15 NOTE — Telephone Encounter (Signed)
Pt called said botox needs PA.

## 2015-11-22 NOTE — Telephone Encounter (Signed)
Noted  

## 2015-12-19 ENCOUNTER — Ambulatory Visit: Payer: BLUE CROSS/BLUE SHIELD | Admitting: Neurology

## 2016-02-19 DIAGNOSIS — R05 Cough: Secondary | ICD-10-CM | POA: Diagnosis not present

## 2016-02-19 DIAGNOSIS — J0140 Acute pansinusitis, unspecified: Secondary | ICD-10-CM | POA: Diagnosis not present

## 2016-02-19 DIAGNOSIS — J111 Influenza due to unidentified influenza virus with other respiratory manifestations: Secondary | ICD-10-CM | POA: Diagnosis not present

## 2016-04-15 DIAGNOSIS — M7711 Lateral epicondylitis, right elbow: Secondary | ICD-10-CM | POA: Diagnosis not present

## 2016-04-15 DIAGNOSIS — Z0000 Encounter for general adult medical examination without abnormal findings: Secondary | ICD-10-CM | POA: Diagnosis not present

## 2016-05-12 DIAGNOSIS — R1084 Generalized abdominal pain: Secondary | ICD-10-CM | POA: Diagnosis not present

## 2016-06-23 DIAGNOSIS — H900 Conductive hearing loss, bilateral: Secondary | ICD-10-CM | POA: Diagnosis not present

## 2016-06-23 DIAGNOSIS — N809 Endometriosis, unspecified: Secondary | ICD-10-CM | POA: Diagnosis not present

## 2016-06-30 DIAGNOSIS — R1031 Right lower quadrant pain: Secondary | ICD-10-CM | POA: Diagnosis not present

## 2016-06-30 DIAGNOSIS — R1032 Left lower quadrant pain: Secondary | ICD-10-CM | POA: Diagnosis not present

## 2016-07-01 DIAGNOSIS — H900 Conductive hearing loss, bilateral: Secondary | ICD-10-CM | POA: Diagnosis not present

## 2016-08-21 ENCOUNTER — Other Ambulatory Visit: Payer: Self-pay | Admitting: Neurology

## 2016-09-02 ENCOUNTER — Other Ambulatory Visit: Payer: Self-pay | Admitting: Neurology

## 2016-09-02 ENCOUNTER — Telehealth: Payer: Self-pay | Admitting: Neurology

## 2016-09-02 MED ORDER — SERTRALINE HCL 100 MG PO TABS
100.0000 mg | ORAL_TABLET | Freq: Every day | ORAL | 0 refills | Status: DC
Start: 2016-09-02 — End: 2016-12-25

## 2016-09-02 NOTE — Telephone Encounter (Signed)
Pt is on the way to [facility], [place] and left sertraline (ZOLOFT) 100 MG tablet at home by mistake. She is wanting 4 pills called to [facility] to hold her until she gets back. Pt said she will get a HA if she does not take the medication and is aware she will not rec any 30 day refills until an appt has been made. Please call the patient to let her know the providers decision

## 2016-09-03 NOTE — Telephone Encounter (Addendum)
Called pt and left VM mssg letting her know that short fill rx was sent in yesterday evening. She may call back during business hrs tomorrow w/ any questions.

## 2016-09-03 NOTE — Telephone Encounter (Signed)
Pt has called back because she did not get a call back in response to [facility] that was [facility] [place] [place].  Please give pt a call

## 2016-09-07 ENCOUNTER — Other Ambulatory Visit (HOSPITAL_COMMUNITY): Payer: Self-pay | Admitting: Obstetrics and Gynecology

## 2016-09-07 DIAGNOSIS — N979 Female infertility, unspecified: Secondary | ICD-10-CM

## 2016-09-11 ENCOUNTER — Ambulatory Visit (HOSPITAL_COMMUNITY)
Admission: RE | Admit: 2016-09-11 | Discharge: 2016-09-11 | Disposition: A | Discharge status: Home / Self Care | Payer: BLUE CROSS/BLUE SHIELD | Source: Ambulatory Visit | Attending: Obstetrics and Gynecology | Admitting: Obstetrics and Gynecology

## 2016-09-11 DIAGNOSIS — N979 Female infertility, unspecified: Secondary | ICD-10-CM | POA: Diagnosis not present

## 2016-09-11 DIAGNOSIS — Z3141 Encounter for fertility testing: Secondary | ICD-10-CM | POA: Diagnosis not present

## 2016-09-11 MED ORDER — IOPAMIDOL (ISOVUE-300) INJECTION 61%
30.0000 mL | Freq: Once | INTRAVENOUS | Status: AC | PRN
Start: 2016-09-11 — End: 2016-09-11
  Administered 2016-09-11: 30 mL

## 2016-11-09 DIAGNOSIS — L570 Actinic keratosis: Secondary | ICD-10-CM | POA: Diagnosis not present

## 2016-11-09 DIAGNOSIS — D485 Neoplasm of uncertain behavior of skin: Secondary | ICD-10-CM | POA: Diagnosis not present

## 2016-11-09 DIAGNOSIS — L821 Other seborrheic keratosis: Secondary | ICD-10-CM | POA: Diagnosis not present

## 2016-11-25 DIAGNOSIS — J040 Acute laryngitis: Secondary | ICD-10-CM | POA: Diagnosis not present

## 2016-12-09 DIAGNOSIS — R05 Cough: Secondary | ICD-10-CM | POA: Diagnosis not present

## 2016-12-15 IMAGING — US US OB COMP LESS 14 WK
1 series · 14 of 21 positions shown · non-contrast
Comparison: None.

CLINICAL DATA: Abdominal pain affecting pregnancy. Gestational age
by LMP of 9 weeks 6 days.

EXAM:
OBSTETRIC <14 WK ULTRASOUND
TECHNIQUE: Transabdominal ultrasound was performed for evaluation of the
gestation as well as the maternal uterus and adnexal regions.

[Series 1: us ob transvaginal · 14 of 21 slices shown]
[im 1/21]
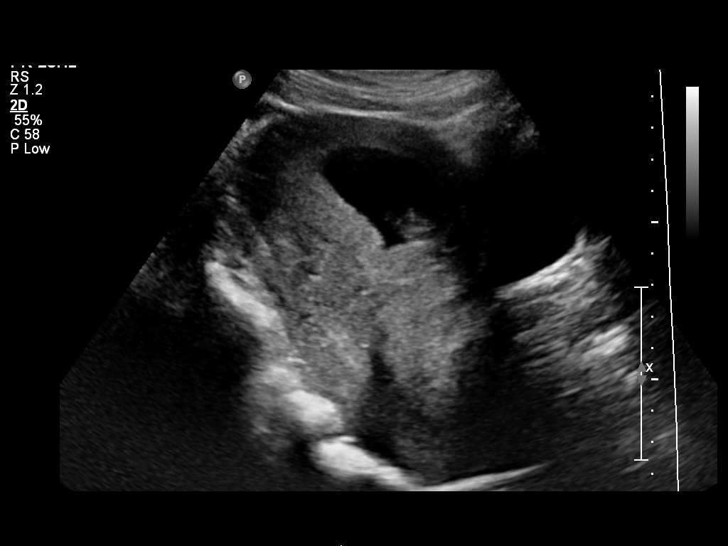
[im 3/21]
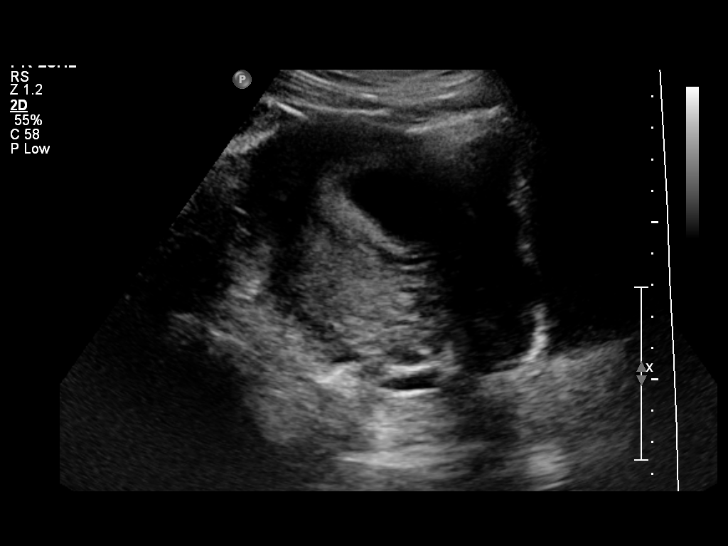
[im 4/21]
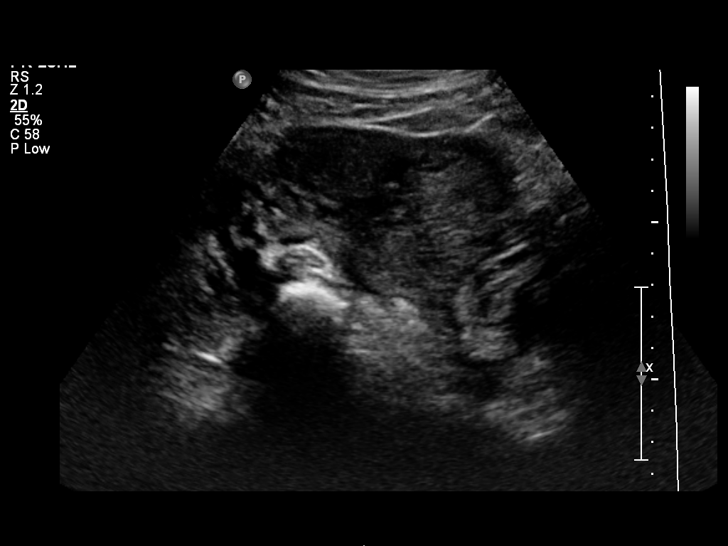
[im 6/21]
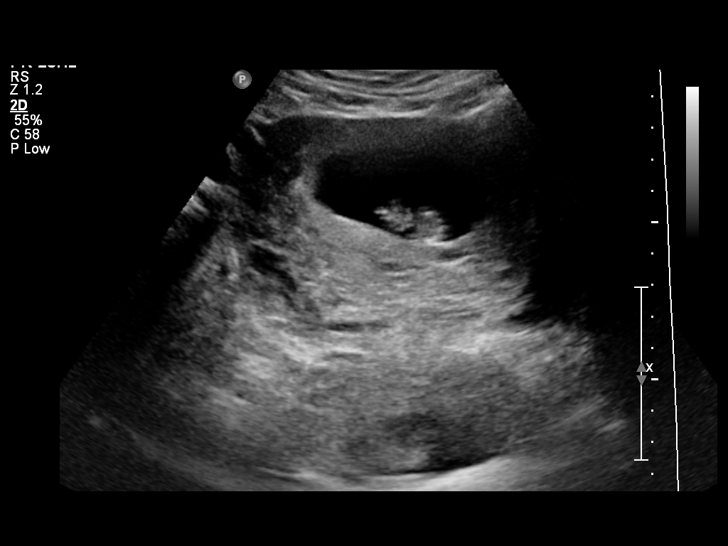
[im 7/21]
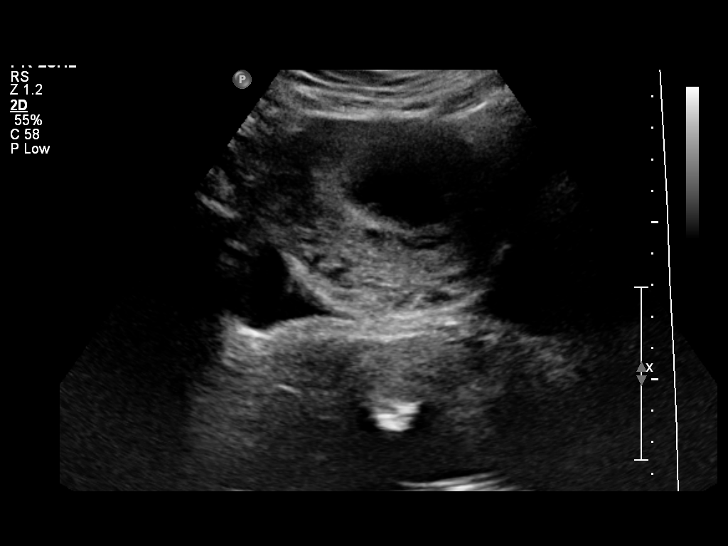
[im 9/21]
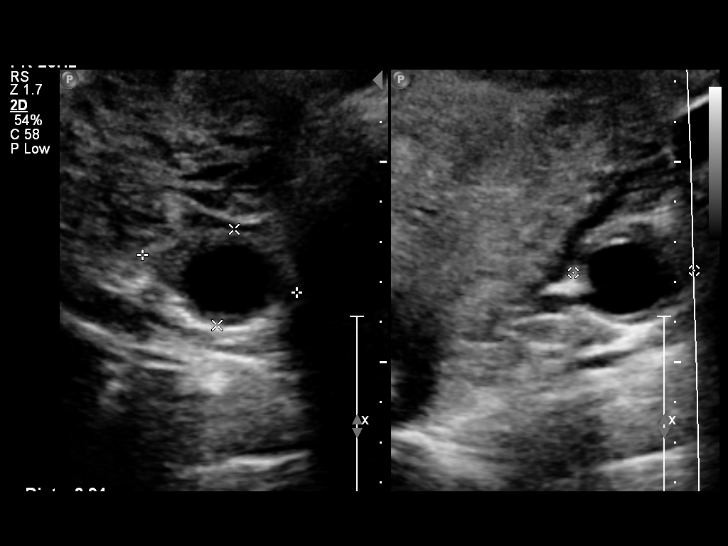
[im 10/21]
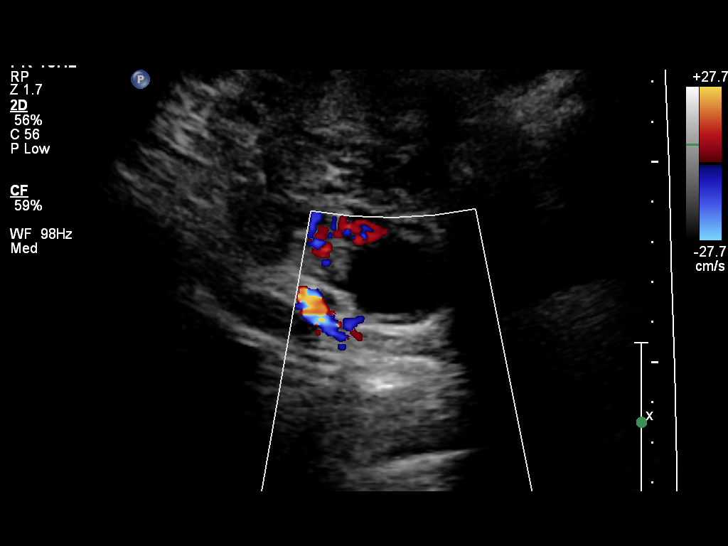
[im 12/21]
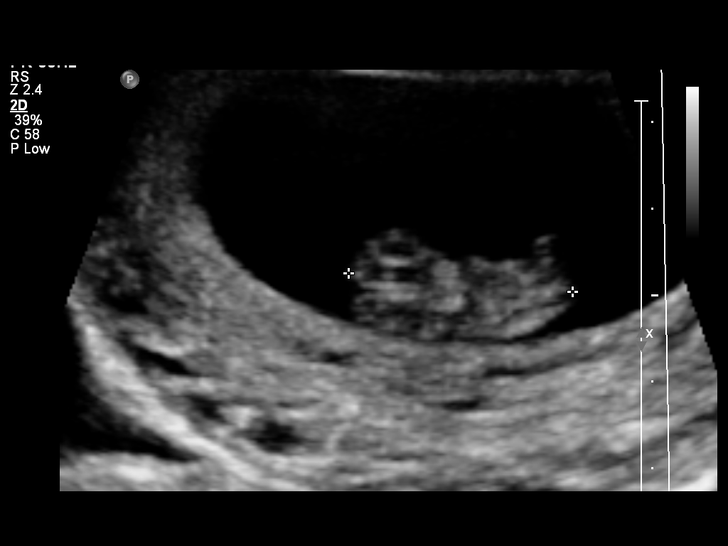
[im 13/21]
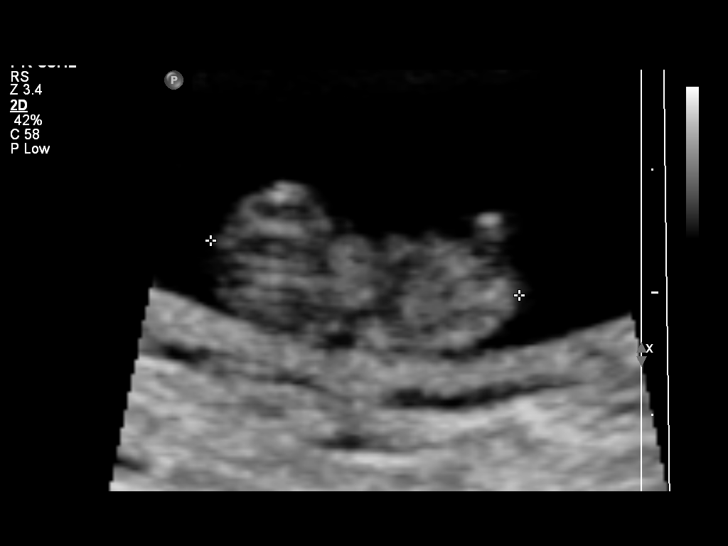
[im 15/21]
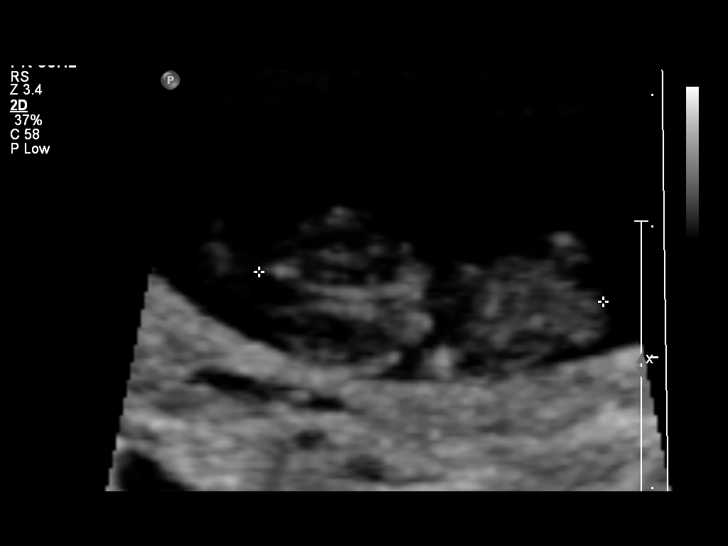
[im 16/21]
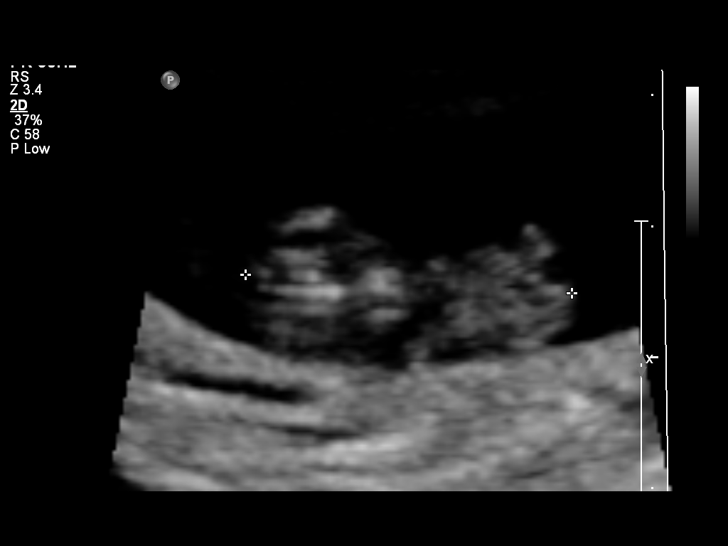
[im 18/21]
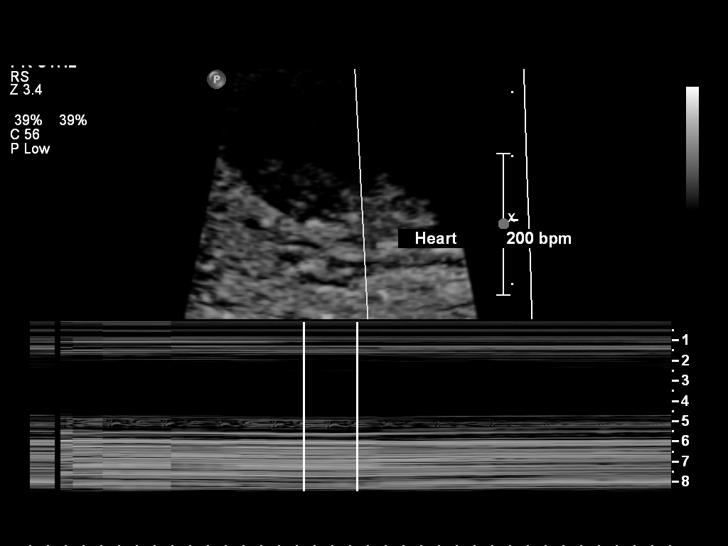
[im 19/21]
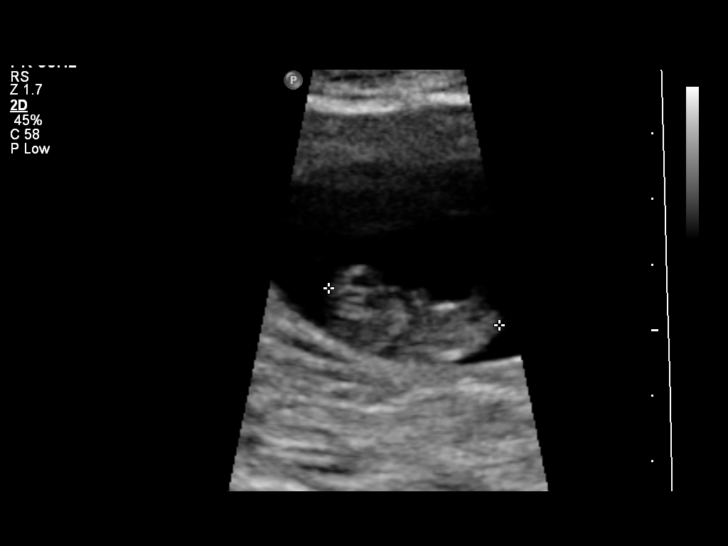
[im 21/21]
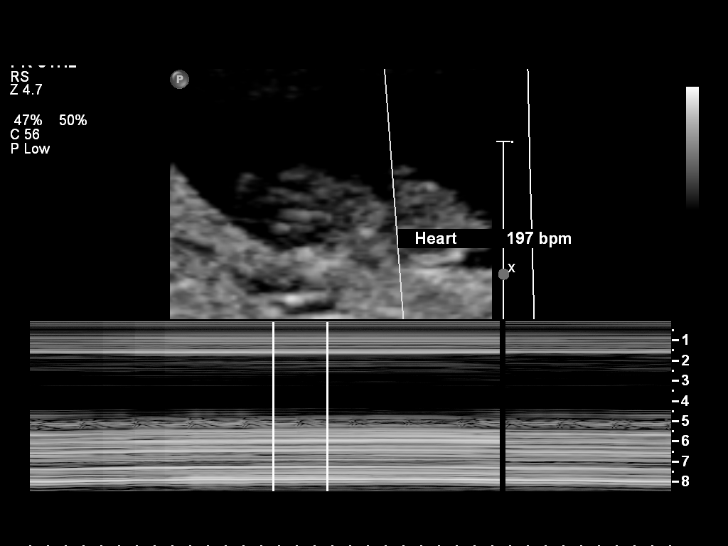

[14 of 21 positions shown; findings below may reference images not displayed]

FINDINGS: Intrauterine gestational sac: Visualized/normal in shape.

Yolk sac:  Visualized

Embryo:  Visualized

Cardiac Activity: Visualized

Heart Rate: 192 bpm

CRL:   27  mm   9 w 4 d                  US EDC: 09/08/2014

Maternal uterus/adnexae: Left ovary is normal in appearance. Right
ovary not directly visualized, however no mass or free fluid
identified.
IMPRESSION: Single living IUP measuring 9 weeks 4 days with US EDC of
09/08/2014. This is concordant with LMP.

No significant maternal uterine or adnexal abnormality identified.

## 2016-12-21 DIAGNOSIS — N912 Amenorrhea, unspecified: Secondary | ICD-10-CM | POA: Diagnosis not present

## 2016-12-21 DIAGNOSIS — R05 Cough: Secondary | ICD-10-CM | POA: Diagnosis not present

## 2016-12-24 NOTE — Progress Notes (Signed)
[facility]  PATIENT: Ellen Vasquez DOB: March 05, 1975   REASON FOR VISIT: Follow-up of her headaches HISTORY FROM: Patient    HISTORY OF PRESENT ILLNESS:UPDATE 11/9/2018CM .Ellen Vasquez, 41 year old female returns for follow-up with a history of chronic headaches.  She was last seen in the office 08/2015 for Botox which was very beneficial she claims. She has had Onzetra to use acutely for her headaches in the past but she has so much pushback from her insurance company because they do not want to pay for this.  She wants to be switched to just regular Imitrex.  She has some form of headache every day she has a bad migraine with visual changes and stabbing pain every few weeks.  The pain in her head starts in the back of her head and eventually spreads to the whole head with a squeezing type quality.  She also has bilateral otosclerosis and has hearing aids both ears.  She complains with ringing in the ears.  Stress is a trigger.  She has tried and failed verapamil and Topamax, nortriptyline and propanolol.  She is currently being treated for a sinus infection.  She returns for reevaluation  Ellen Vasquez is a 41 y.o. female to follow up  for chronic headaches.  She has a chiari malformation and she has been evaluated at [facility] and no neurosurgical intervention was needed. Migraines started in teenage years. Migraines with the visual disturbances in her 32s. She has 30 headache days a month, she has at least 15 migraines a month. Daily headaches are  unilateral on the left like a screw going through her eye, stabbing. The migraines are on the right side, then spreads to the whole head, pain in the neck traveling to the cervical area, continuous squeezing, she feels like she is being pulled to the right, she has to go into a dark room and lay down which helps, sound sensitivity, nausea and vomiitng. Can last days and can be 10/10. It has been at this  frequency for years. Stress is a trigger. No food triggers. No medication overuse. From a review of records, she has tried and failed several first-line medications for migraines: verapamil - had side effects, Topamax - C/I due to nephrolithiasis, Nortriptyline- had side effects of excess weight gain on a very low dose. She was recently prescribed Dan [place] and states it works. She tried sumatriptan pills with severe nausea and vomited. She stopped propanolol. Her headache today is due to stress and  she has a function to go to at the college tonight. She returns for reevaluation. She thought she was going to be getting Botox today, prior auth started at last visit with Dr. Jaynee Eagles on 04/01/2015. She does want to  discuss Botox and my thoughts on this.    REVIEW OF SYSTEMS: Full 14 system review of systems performed and notable only for those listed, all others are neg:  Constitutional: neg  Cardiovascular: neg Ear/Nose/Throat: Hearing loss, ringing in the ears Skin: neg Eyes: neg Respiratory: neg Gastroitestinal: neg  Hematology/Lymphatic: neg  Endocrine: neg Musculoskeletal: Back pain Allergy/Immunology: neg Neurological: Dizziness headache numbness Psychiatric: Anxiety Sleep : neg   ALLERGIES: Allergies  Allergen Reactions  . Shellfish Allergy Hives  . Verapamil     GI sx, weakness  . Other     Surgical glue causes a rash  . Sulfa Antibiotics Dermatitis and Rash    HOME MEDICATIONS: Outpatient Medications Prior to Visit  Medication Sig Dispense Refill  .  amoxicillin-clavulanate (AUGMENTIN) 875-125 MG tablet Take 1 tablet 2 (two) times daily by mouth.    . sertraline (ZOLOFT) 100 MG tablet Take 1 tablet (100 mg total) by mouth daily. 4 tablet 0  . benzonatate (TESSALON) 100 MG capsule 1 CAPSULE EVERY 8 HOURS AS NEEDED FOR COUGH ORALLY  0  . norethindrone-ethinyl estradiol-iron (MICROGESTIN FE,GILDESS FE,LOESTRIN FE) 1.5-30 MG-MCG tablet Take by mouth.    Marland Kitchen PROAIR HFA 108 (90  Base) MCG/ACT inhaler     . SUMAtriptan Succinate (ONZETRA XSAIL) 11 MG/NOSEPC EXHP Place 1 Dose into both nostrils as needed (administer 2 nose pieces (1 per nostril)at onset of migraine and can repeat in 2 hours). At onset of acute migraine each nostril (Patient not taking: Reported on 12/25/2016) 1 each 11   Facility-Administered Medications Prior to Visit  Medication Dose Route Frequency Provider Last Rate Last Dose  . gadopentetate dimeglumine (MAGNEVIST) injection 15 mL  15 mL Intravenous Once PRN Dohmeier, Asencion Partridge, MD        PAST MEDICAL HISTORY: Past Medical History:  Diagnosis Date  . Anxiety   . Chiari malformation type I ([place])    MRI [facility] 2013  . Depression    PPD with 2nd baby  . Endometriosis 1997  . Headache   . Headache in pregnancy   . Hearing loss   . Kidney infection   . Kidney stones   . Otosclerosis 2008  . Otosclerosis     PAST SURGICAL HISTORY: Past Surgical History:  Procedure Laterality Date  . DILATION AND CURETTAGE OF UTERUS  May 2015  . LAPAROSCOPIC ABDOMINAL EXPLORATION  06/2013   has had 4 surgeries  . SHOULDER SURGERY  2000  . TONSILLECTOMY AND ADENOIDECTOMY  2010  . WISDOM TOOTH EXTRACTION  2008    FAMILY HISTORY: Family History  Problem Relation Age of Onset  . Asthma Father   . Arthritis Father        Rheumatoid  . Cancer Maternal Grandmother        skin  . Hypertension Other   . Hyperlipidemia Other   . Stroke Other   . Obesity Other   . Aneurysm Paternal Grandfather        brain  . Skin cancer Paternal Grandmother   . Migraines Neg Hx     SOCIAL HISTORY: Social History   Socioeconomic History  . Marital status: Married    Spouse name: Not on file  . Number of children: 2  . Years of education: Phd ED  . Highest education level: Not on file  Social Needs  . Financial resource strain: Not on file  . Food insecurity - worry: Not on file  . Food insecurity - inability: Not on file  . Transportation needs - medical: Not  on file  . Transportation needs - non-medical: Not on file  Occupational History  . Occupation: Professor    Comment: Hydrographic surveyor  Tobacco Use  . Smoking status: Former Smoker    Types: Cigarettes    Last attempt to quit: 11/25/2001    Years since quitting: 15.0  . Smokeless tobacco: Never Used  Substance and Sexual Activity  . Alcohol use: Yes    Alcohol/week: 0.6 oz    Types: 1 Glasses of wine per week    Comment: social events  . Drug use: No  . Sexual activity: Yes    Birth control/protection: None  Other Topics Concern  . Not on file  Social History Narrative   Caffeine none (occasional soda).  PHYSICAL EXAM  Vitals:   12/25/16 1056  BP: 112/76  Pulse: 99  Weight: 171 lb 8 oz (77.8 kg)   Body mass index is 25.33 kg/m.  Generalized: Well developed, in no acute distress  Head: normocephalic and atraumatic,. Oropharynx benign  Neck: Supple,  Musculoskeletal: No deformity   Neurological examination   Mentation: Alert oriented to time, place, history taking. Attention span and concentration appropriate. Recent and remote memory intact.  Follows all commands speech and language fluent.   Cranial nerve II-XII: Pupils were equal round reactive to light extraocular movements were full, visual field were full on confrontational test. Facial sensation and strength were normal.Bil hearing aids. Uvula tongue midline. head turning and shoulder shrug were normal and symmetric.Tongue protrusion into cheek strength was normal. Motor: normal bulk and tone, full strength in the BUE, BLE, fine finger movements normal, no pronator drift. No focal weakness Sensory: normal and symmetric to light touch,  Coordination: finger-nose-finger, heel-to-shin bilaterally, no dysmetria Reflexes: Symmetric upper and lower plantar responses were flexor bilaterally. Gait and Station: Rising up from seated position without assistance, normal stance,  moderate stride, good arm swing, smooth  turning, able to perform tiptoe, and heel walking without difficulty. Tandem gait is steady  DIAGNOSTIC DATA (LABS, IMAGING, TESTING) - I reviewed patient records, labs, notes, testing and imaging myself where available.  Lab Results  Component Value Date   WBC 21.5 (H) 08/31/2014   HGB 11.0 (L) 08/31/2014   HCT 32.9 (L) 08/31/2014   MCV 91.1 08/31/2014   PLT 179 08/31/2014      Component Value Date/Time   NA 133 (L) 08/25/2014 0125   K 3.3 (L) 08/25/2014 0125   CL 106 08/25/2014 0125   CO2 23 08/25/2014 0125   GLUCOSE 115 (H) 08/25/2014 0125   BUN 6 08/25/2014 0125   CREATININE 0.71 08/25/2014 0125   CALCIUM 8.3 (L) 08/25/2014 0125   PROT 5.5 (L) 08/25/2014 0125   ALBUMIN 3.1 (L) 08/25/2014 0125   AST 19 08/25/2014 0125   ALT 12 (L) 08/25/2014 0125   ALKPHOS 128 (H) 08/25/2014 0125   BILITOT 0.3 08/25/2014 0125   GFRNONAA >60 08/25/2014 0125   GFRAA >60 08/25/2014 0125    ASSESSMENT AND PLAN  41 y.o. year old female  has a past medical history of Anxiety, Chiari malformation type I ([place]), Depression, Endometriosis (1997), Headache, Headache in pregnancy, Hearing loss, , Otosclerosis (2008), and Otosclerosis. here to follow-up for her daily headache.She has tried and failed verapamil and Topamax, nortriptyline and propanolol.  She had Botox in July of last year and it was very beneficial.  PLAN: Imitrex 100 p.o. as needed acute headache will refill Continue Zoloft 100 mg daily will refill Patient will be set up for Botox with Dr. Jaynee Eagles  F/U for botox Dennie Bible, [facility], [facility], [facility] [address] [place], [place] [address] [phone]

## 2016-12-25 ENCOUNTER — Encounter: Payer: Self-pay | Admitting: Nurse Practitioner

## 2016-12-25 ENCOUNTER — Ambulatory Visit (INDEPENDENT_AMBULATORY_CARE_PROVIDER_SITE_OTHER): Payer: BLUE CROSS/BLUE SHIELD | Admitting: Nurse Practitioner

## 2016-12-25 VITALS — BP 112/76 | HR 99 | Wt 171.5 lb

## 2016-12-25 DIAGNOSIS — R51 Headache: Secondary | ICD-10-CM | POA: Diagnosis not present

## 2016-12-25 DIAGNOSIS — G8929 Other chronic pain: Secondary | ICD-10-CM

## 2016-12-25 DIAGNOSIS — G43711 Chronic migraine without aura, intractable, with status migrainosus: Secondary | ICD-10-CM | POA: Diagnosis not present

## 2016-12-25 MED ORDER — SUMATRIPTAN SUCCINATE 100 MG PO TABS: 100.0000 mg | ORAL_TABLET | ORAL | 6 refills | Status: DC | PRN

## 2016-12-25 MED ORDER — SERTRALINE HCL 100 MG PO TABS: 100.0000 mg | ORAL_TABLET | Freq: Every day | ORAL | 6 refills | Status: DC

## 2016-12-25 NOTE — Progress Notes (Signed)
Personally  participated in, made any corrections needed, and agree with history, physical, neuro exam,assessment and plan as stated above.    Antonia Ahern, MD [facility] 

## 2016-12-25 NOTE — Patient Instructions (Signed)
Imitrex 100 p.o. as needed acute headache Continue Zoloft 100 mg daily Patient will be set up for Botox with Dr. Jaynee Eagles

## 2016-12-29 ENCOUNTER — Telehealth: Payer: Self-pay | Admitting: Neurology

## 2016-12-29 NOTE — Telephone Encounter (Signed)
I called patient to scheduled her botox injection per a request from Cecille Rubin NP. She did not answer so I left a VM asking her to call me back.

## 2017-02-04 DIAGNOSIS — H6993 Unspecified Eustachian tube disorder, bilateral: Secondary | ICD-10-CM | POA: Diagnosis not present

## 2017-02-04 DIAGNOSIS — J329 Chronic sinusitis, unspecified: Secondary | ICD-10-CM | POA: Diagnosis not present

## 2017-02-22 DIAGNOSIS — N912 Amenorrhea, unspecified: Secondary | ICD-10-CM | POA: Diagnosis not present

## 2017-02-24 DIAGNOSIS — N912 Amenorrhea, unspecified: Secondary | ICD-10-CM | POA: Diagnosis not present

## 2017-03-03 DIAGNOSIS — N911 Secondary amenorrhea: Secondary | ICD-10-CM | POA: Diagnosis not present

## 2017-03-10 DIAGNOSIS — N911 Secondary amenorrhea: Secondary | ICD-10-CM | POA: Diagnosis not present

## 2017-03-15 DIAGNOSIS — R42 Dizziness and giddiness: Secondary | ICD-10-CM | POA: Diagnosis not present

## 2017-03-19 DIAGNOSIS — R0602 Shortness of breath: Secondary | ICD-10-CM | POA: Diagnosis not present

## 2017-03-19 DIAGNOSIS — M7989 Other specified soft tissue disorders: Secondary | ICD-10-CM | POA: Diagnosis not present

## 2017-03-23 NOTE — Progress Notes (Deleted)
Cardiology Office Note   Date:  03/23/2017   ID:  Ellen Vasquez, DOB 26-Jun-1975, MRN [id number]  PCP:  Aretta Nip, MD  Cardiologist:   Peter Martinique, MD   No chief complaint on file.     History of Present Illness: Ellen Vasquez is a 42 y.o. female who is seen at the request of Dr. Gaetano Net for evaluation of shortness of breath.     Past Medical History:  Diagnosis Date  . Anxiety   . Chiari malformation type I ([place])    MRI [facility] 2013  . Depression    PPD with 2nd baby  . Endometriosis 1997  . Headache   . Headache in pregnancy   . Hearing loss   . Kidney infection   . Kidney stones   . Otosclerosis 2008  . Otosclerosis     Past Surgical History:  Procedure Laterality Date  . DILATION AND CURETTAGE OF UTERUS  May 2015  . LAPAROSCOPIC ABDOMINAL EXPLORATION  06/2013   has had 4 surgeries  . SHOULDER SURGERY  2000  . TONSILLECTOMY AND ADENOIDECTOMY  2010  . WISDOM TOOTH EXTRACTION  2008     Current Outpatient Medications  Medication Sig Dispense Refill  . amoxicillin-clavulanate (AUGMENTIN) 875-125 MG tablet Take 1 tablet 2 (two) times daily by mouth.    . sertraline (ZOLOFT) 100 MG tablet Take 1 tablet (100 mg total) daily by mouth. 30 tablet 6  . SUMAtriptan (IMITREX) 100 MG tablet Take 1 tablet (100 mg total) every 2 (two) hours as needed by mouth for migraine. May repeat in 2 hours if headache persists or recurs. 10 tablet 6   No current facility-administered medications for this visit.    Facility-Administered Medications Ordered in Other Visits  Medication Dose Route Frequency Provider Last Rate Last Dose  . gadopentetate dimeglumine (MAGNEVIST) injection 15 mL  15 mL Intravenous Once PRN Dohmeier, Asencion Partridge, MD        Allergies:   Shellfish allergy; Verapamil; Other; and Sulfa antibiotics    Social History:  The patient  reports that she quit smoking about 15 years ago. Her smoking use included cigarettes. she has never used  smokeless tobacco. She reports that she drinks about 0.6 oz of alcohol per week. She reports that she does not use drugs.   Family History:  The patient's ***family history includes Aneurysm in her paternal grandfather; Arthritis in her father; Asthma in her father; Cancer in her maternal grandmother; Hyperlipidemia in her other; Hypertension in her other; Obesity in her other; Skin cancer in her paternal grandmother; Stroke in her other.    ROS:  Please see the history of present illness.   Otherwise, review of systems are positive for {NONE DEFAULTED:18576::"none"}.   All other systems are reviewed and negative.    PHYSICAL EXAM: VS:  There were no vitals taken for this visit. , BMI There is no height or weight on file to calculate BMI. GEN: Well nourished, well developed, in no acute distress  HEENT: normal  Neck: no JVD, carotid bruits, or masses Cardiac: ***RRR; no murmurs, rubs, or gallops,no edema  Respiratory:  clear to auscultation bilaterally, normal work of breathing GI: soft, nontender, nondistended, + BS MS: no deformity or atrophy  Skin: warm and dry, no rash Neuro:  Strength and sensation are intact Psych: euthymic mood, full affect   EKG:  EKG {ACTION; IS/IS XTG:62694854} ordered today. The ekg ordered today demonstrates ***   Recent Labs: No results found for  requested labs within last 8760 hours.    Lipid Panel No results found for: CHOL, TRIG, HDL, CHOLHDL, VLDL, LDLCALC, LDLDIRECT   Dated 04/15/16: cholesterol 162, triglycerides 55, HDL 50, LDL 101. Chemistries normal Dated 12/21/16: Hgb 14.8.  Wt Readings from Last 3 Encounters:  12/25/16 171 lb 8 oz (77.8 kg)  06/04/15 163 lb (73.9 kg)  04/01/15 169 lb (76.7 kg)      Other studies Reviewed: Additional studies/ records that were reviewed today include: ***. Review of the above records demonstrates: ***   ASSESSMENT AND PLAN:  1.  ***   Current medicines are reviewed at length with the patient  today.  The patient {ACTIONS; HAS/DOES NOT HAVE:19233} concerns regarding medicines.  The following changes have been made:  {PLAN; NO CHANGE:13088:s}  Labs/ tests ordered today include: *** No orders of the defined types were placed in this encounter.    Disposition:   FU with *** in {gen number 7-61:607371} {Days to years:10300}  Signed, Peter Martinique, MD  03/23/2017 7:12 AM    [facility] [address] [place], [place], [address] Phone [phone], Fax [phone]

## 2017-03-24 ENCOUNTER — Ambulatory Visit (INDEPENDENT_AMBULATORY_CARE_PROVIDER_SITE_OTHER): Payer: BLUE CROSS/BLUE SHIELD | Admitting: Cardiovascular Disease

## 2017-03-24 ENCOUNTER — Encounter: Payer: Self-pay | Admitting: Cardiovascular Disease

## 2017-03-24 ENCOUNTER — Ambulatory Visit: Payer: BLUE CROSS/BLUE SHIELD | Admitting: Cardiology

## 2017-03-24 ENCOUNTER — Encounter (INDEPENDENT_AMBULATORY_CARE_PROVIDER_SITE_OTHER): Payer: Self-pay

## 2017-03-24 VITALS — BP 112/72 | HR 82 | Ht 69.0 in | Wt 174.0 lb

## 2017-03-24 DIAGNOSIS — Z3685 Encounter for antenatal screening for Streptococcus B: Secondary | ICD-10-CM | POA: Diagnosis not present

## 2017-03-24 DIAGNOSIS — Z3481 Encounter for supervision of other normal pregnancy, first trimester: Secondary | ICD-10-CM | POA: Diagnosis not present

## 2017-03-24 DIAGNOSIS — R0609 Other forms of dyspnea: Secondary | ICD-10-CM | POA: Insufficient documentation

## 2017-03-24 NOTE — Progress Notes (Signed)
03/24/2017 Ellen Vasquez   1975-04-03  [id number]  Primary Physician Rankins, Bill Salinas, MD Primary Cardiologist: Lorretta Harp MD Lupe Carney, [place]  HPI:  Ellen Vasquez is a 42 y.o. married Caucasian female currently [redacted] weeks pregnant, mother to 3 children is accompanied by her husband Ellen Vasquez  today. 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 She was referred by Dr., Gertie Fey for new onset dyspnea. She has no cardiac risk factors. She's had 3 prior pregnancies that were uncomplicated. She is very active and actually ran a 5K race in November prior to getting pregnant. The first 6 weeks of pregnancy were unremarkable. 3 weeks ago she noticed increasing dyspnea on exertion which has gotten progressively worse. There is an element of orthopnea as well. She recently had venous Doppler studies done at [facility] imaging which were negative for DVT and a chest x-ray that apparently showed no active disease. Routine lab work was unremarkable including normal hemoglobin and electrolytes.   Current Meds  Medication Sig  . Prenat w/o [id number] (CITRANATAL ASSURE PO) Take 1 tablet by mouth daily.     Allergies  Allergen Reactions  . Shellfish Allergy Hives  . Verapamil     GI sx, weakness  . Other     Surgical glue causes a rash  . Sulfa Antibiotics Dermatitis and Rash    Social History   Socioeconomic History  . Marital status: Married    Spouse name: Not on file  . Number of children: 2  . Years of education: Phd ED  . Highest education level: Not on file  Social Needs  . Financial resource strain: Not on file  . Food insecurity - worry: Not on file  . Food insecurity - inability: Not on file  . Transportation needs - medical: Not on file  . Transportation needs - non-medical: Not on file  Occupational History  . Occupation: Professor    Comment: Hydrographic surveyor  Tobacco Use  . Smoking status: Former Smoker    Types: Cigarettes    Last attempt  to quit: 11/25/2001    Years since quitting: 15.3  . Smokeless tobacco: Never Used  Substance and Sexual Activity  . Alcohol use: Yes    Alcohol/week: 0.6 oz    Types: 1 Glasses of wine per week    Comment: social events  . Drug use: No  . Sexual activity: Yes    Birth control/protection: None  Other Topics Concern  . Not on file  Social History Narrative   Caffeine none (occasional soda).     Review of Systems: General: negative for chills, fever, night sweats or weight changes.  Cardiovascular: negative for chest pain, dyspnea on exertion, edema, orthopnea, palpitations, paroxysmal nocturnal dyspnea or shortness of breath Dermatological: negative for rash Respiratory: negative for cough or wheezing Urologic: negative for hematuria Abdominal: negative for nausea, vomiting, diarrhea, bright red blood per rectum, melena, or hematemesis Neurologic: negative for visual changes, syncope, or dizziness All other systems reviewed and are otherwise negative except as noted above.    Blood pressure 112/72, pulse 82, height 5\' 9"  (1.753 m), weight 174 lb (78.9 kg), unknown if currently breastfeeding.  General appearance: alert and no distress Neck: no adenopathy, no carotid bruit, no JVD, supple, symmetrical, trachea midline and thyroid not enlarged, symmetric, no tenderness/mass/nodules Lungs: clear to auscultation bilaterally Heart: regular rate and rhythm, S1, S2 normal, no murmur, click, rub or gallop Extremities: extremities normal, atraumatic, no cyanosis or edema  Pulses: 2+ and symmetric Skin: Skin color, texture, turgor normal. No rashes or lesions Neurologic: Alert and oriented X 3, normal strength and tone. Normal symmetric reflexes. Normal coordination and gait  EKG sinus rhythm at 82 with ST or T wave changes. There was RSR prime in lead V1 and V2 suggesting RV conduction delay. I personally reviewed this EKG.  ASSESSMENT AND PLAN:   Dyspnea on exertion Christalyn was  referred by Dr. Gertie Fey, her OB/GYN, for evaluation of new onset dyspnea on exertion. She has no cardiac risk factors. She's had 3 prior on-call. Pregnancies. She is fairly active and ran a 5K in November last year without difficulty. She's currently [redacted] weeks pregnant. She did well until week 6 when she noticed increasing dyspnea on exertion just gotten progressively worse. She also has an element of orthopnea and some paresthesias. She also suffers from migraines. She has no evidence of failure on exam. She has not have an S3 or rub. There is no peripheral edema or elevated jugular venous pressure. She had recent venous Doppler studies which were negative for DVT and a normal chest x-ray. I am going to get a 2-D echocardiogram to further evaluate LV function.      Lorretta Harp MD FACP,FACC,FAHA, [facility] 03/24/2017 2:06 PM

## 2017-03-24 NOTE — Assessment & Plan Note (Signed)
Ellen Vasquez was referred by Dr. Gertie Fey, her OB/GYN, for evaluation of new onset dyspnea on exertion. She has no cardiac risk factors. She's had 3 prior on-call. Pregnancies. She is fairly active and ran a 5K in November last year without difficulty. She's currently [redacted] weeks pregnant. She did well until week 6 when she noticed increasing dyspnea on exertion just gotten progressively worse. She also has an element of orthopnea and some paresthesias. She also suffers from migraines. She has no evidence of failure on exam. She has not have an S3 or rub. There is no peripheral edema or elevated jugular venous pressure. She had recent venous Doppler studies which were negative for DVT and a normal chest x-ray. I am going to get a 2-D echocardiogram to further evaluate LV function.

## 2017-03-24 NOTE — Patient Instructions (Signed)
Medication Instructions: Your physician recommends that you continue on your current medications as directed. Please refer to the Current Medication list given to you today.   Testing/Procedures: Your physician has requested that you have an echocardiogram THIS WEEK. Echocardiography is a painless test that uses sound waves to create images of your heart. It provides your doctor with information about the size and shape of your heart and how well your heart's chambers and valves are working. This procedure takes approximately one hour. There are no restrictions for this procedure.  Follow-Up: Your physician recommends that you schedule a follow-up appointment in: 2 weeks with Dr. Gwenlyn Found.

## 2017-03-26 ENCOUNTER — Inpatient Hospital Stay (HOSPITAL_COMMUNITY)
Admission: AD | Admit: 2017-03-26 | Discharge: 2017-03-27 | Disposition: A | Discharge status: Home / Self Care | Payer: BLUE CROSS/BLUE SHIELD | Source: Ambulatory Visit | Attending: Obstetrics and Gynecology | Admitting: Obstetrics and Gynecology

## 2017-03-26 ENCOUNTER — Other Ambulatory Visit: Payer: Self-pay

## 2017-03-26 ENCOUNTER — Encounter (HOSPITAL_COMMUNITY): Payer: Self-pay | Admitting: *Deleted

## 2017-03-26 DIAGNOSIS — Z79899 Other long term (current) drug therapy: Secondary | ICD-10-CM | POA: Diagnosis not present

## 2017-03-26 DIAGNOSIS — Z3A09 9 weeks gestation of pregnancy: Secondary | ICD-10-CM

## 2017-03-26 DIAGNOSIS — O99341 Other mental disorders complicating pregnancy, first trimester: Secondary | ICD-10-CM | POA: Insufficient documentation

## 2017-03-26 DIAGNOSIS — O26899 Other specified pregnancy related conditions, unspecified trimester: Secondary | ICD-10-CM

## 2017-03-26 DIAGNOSIS — Z91013 Allergy to seafood: Secondary | ICD-10-CM | POA: Insufficient documentation

## 2017-03-26 DIAGNOSIS — Z87891 Personal history of nicotine dependence: Secondary | ICD-10-CM | POA: Diagnosis not present

## 2017-03-26 DIAGNOSIS — G43909 Migraine, unspecified, not intractable, without status migrainosus: Secondary | ICD-10-CM | POA: Insufficient documentation

## 2017-03-26 DIAGNOSIS — O26891 Other specified pregnancy related conditions, first trimester: Secondary | ICD-10-CM | POA: Diagnosis not present

## 2017-03-26 DIAGNOSIS — O9989 Other specified diseases and conditions complicating pregnancy, childbirth and the puerperium: Secondary | ICD-10-CM | POA: Diagnosis not present

## 2017-03-26 DIAGNOSIS — Z3A08 8 weeks gestation of pregnancy: Secondary | ICD-10-CM | POA: Diagnosis not present

## 2017-03-26 DIAGNOSIS — F329 Major depressive disorder, single episode, unspecified: Secondary | ICD-10-CM | POA: Diagnosis not present

## 2017-03-26 DIAGNOSIS — O208 Other hemorrhage in early pregnancy: Secondary | ICD-10-CM | POA: Diagnosis not present

## 2017-03-26 DIAGNOSIS — F419 Anxiety disorder, unspecified: Secondary | ICD-10-CM | POA: Diagnosis not present

## 2017-03-26 DIAGNOSIS — Z882 Allergy status to sulfonamides status: Secondary | ICD-10-CM | POA: Diagnosis not present

## 2017-03-26 DIAGNOSIS — R109 Unspecified abdominal pain: Secondary | ICD-10-CM | POA: Insufficient documentation

## 2017-03-26 DIAGNOSIS — R197 Diarrhea, unspecified: Secondary | ICD-10-CM | POA: Diagnosis not present

## 2017-03-26 LAB — URINALYSIS, ROUTINE W REFLEX MICROSCOPIC
Bilirubin Urine: NEGATIVE
Glucose, UA: NEGATIVE mg/dL
HGB URINE DIPSTICK: NEGATIVE
KETONES UR: NEGATIVE mg/dL
LEUKOCYTES UA: NEGATIVE
Nitrite: NEGATIVE
PROTEIN: NEGATIVE mg/dL
Specific Gravity, Urine: 1.004 — ABNORMAL LOW (ref 1.005–1.030)
pH: 8 (ref 5.0–8.0)

## 2017-03-26 LAB — POCT PREGNANCY, URINE: Preg Test, Ur: POSITIVE — AB

## 2017-03-26 MED ORDER — BUTALBITAL-APAP-CAFFEINE 50-325-40 MG PO TABS
2.0000 | ORAL_TABLET | Freq: Once | ORAL | Status: AC
  Administered 2017-03-27: 2 via ORAL
  Filled 2017-03-26: qty 2

## 2017-03-26 MED ORDER — PROMETHAZINE HCL 25 MG/ML IJ SOLN
25.0000 mg | Freq: Once | INTRAMUSCULAR | Status: AC
  Administered 2017-03-27: 25 mg via INTRAVENOUS
  Filled 2017-03-26: qty 1

## 2017-03-26 MED ORDER — LACTATED RINGERS IV BOLUS (SEPSIS)
1000.0000 mL | Freq: Once | INTRAVENOUS | Status: AC
  Administered 2017-03-27: 1000 mL via INTRAVENOUS

## 2017-03-26 NOTE — MAU Note (Signed)
Diarrhea x 7 since 1800. No vomiting but nausea. Has migraine and sometimes gets numbness in face. L side of face numb and occ numbness L leg. Has had doppler studies in leg and no DVTs. Seeing cardiologist for SOB at times and will have echo in near future.

## 2017-03-26 NOTE — MAU Provider Note (Signed)
History     CSN: [id number]  Arrival date and time: 03/26/17 2234   First Provider Initiated Contact with Patient 03/26/17 2332      Chief Complaint  Patient presents with  . Abdominal Pain  . Headache  . Diarrhea  . Numbness   HPI Ellen Vasquez is a 42 y.o. [id number] at [redacted]w[redacted]d who presents with diarrhea and abdominal pain. She states she felt nauseous around 4pm and at 6pm started having diarrhea. She reports 7 episodes of diarrhea since then. She denies any vomiting but reports nausea. Denies any contact with people with similar symptoms. She reports constant lower abdominal pain that she rates a 6/10 and states it is different than the pain she feels when she has diarrhea. She has not tried any medication for the pain.   She also reports a migraine that started today. She has frequent migraines, with the last one lasting Sunday through Tuesday. She has a neurologist but has not seen them during this pregnancy. She reports taking phenergan to help her sleep until the migraine is gone but has not taken any pain medication. She rates the headache a 7/10. She also reports numbness in her face with the headache. She states she typically has numbness when she has migraines.   OB History    Gravida Para Term Preterm AB Living   4 3 3     3    SAB TAB Ectopic Multiple Live Births         0 3      Past Medical History:  Diagnosis Date  . Anxiety   . Chiari malformation type I ([place])    MRI [facility] 2013  . Depression    PPD with 2nd baby  . Endometriosis 1997  . Headache   . Headache in pregnancy   . Hearing loss   . Kidney infection   . Kidney stones   . Otosclerosis 2008  . Otosclerosis     Past Surgical History:  Procedure Laterality Date  . DILATION AND CURETTAGE OF UTERUS  May 2015  . LAPAROSCOPIC ABDOMINAL EXPLORATION  06/2013   has had 4 surgeries  . SHOULDER SURGERY  2000  . TONSILLECTOMY AND ADENOIDECTOMY  2010  . WISDOM TOOTH EXTRACTION  2008    Family History   Problem Relation Age of Onset  . Asthma Father   . Arthritis Father        Rheumatoid  . Cancer Maternal Grandmother        skin  . Hypertension Other   . Hyperlipidemia Other   . Stroke Other   . Obesity Other   . Aneurysm Paternal Grandfather        brain  . Skin cancer Paternal Grandmother   . Migraines Neg Hx     Social History   Tobacco Use  . Smoking status: Former Smoker    Types: Cigarettes    Last attempt to quit: 11/25/2001    Years since quitting: 15.3  . Smokeless tobacco: Never Used  Substance Use Topics  . Alcohol use: Yes    Alcohol/week: 0.6 oz    Types: 1 Glasses of wine per week    Comment: social events  . Drug use: No    Allergies:  Allergies  Allergen Reactions  . Shellfish Allergy Hives  . Verapamil     GI sx, weakness  . Other     Surgical glue causes a rash  . Sulfa Antibiotics Dermatitis and Rash    Medications Prior  to Admission  Medication Sig Dispense Refill Last Dose  . Prenat w/o A-FeCbGl-DSS-FA-DHA (CITRANATAL ASSURE PO) Take 1 tablet by mouth daily.   03/26/2017 at Unknown time  . sertraline (ZOLOFT) 100 MG tablet Take 100 mg by mouth daily.   03/25/2017 at Unknown time    Review of Systems  Constitutional: Negative.  Negative for fatigue and fever.  HENT: Negative.   Respiratory: Negative.  Negative for shortness of breath.   Cardiovascular: Negative.  Negative for chest pain.  Gastrointestinal: Positive for diarrhea and nausea. Negative for abdominal pain, constipation and vomiting.  Genitourinary: Negative.  Negative for dysuria, vaginal bleeding and vaginal discharge.  Neurological: Positive for numbness and headaches. Negative for dizziness.   Physical Exam   Blood pressure 117/68, pulse 85, temperature 98.5 F (36.9 C), resp. rate 18, height 5\' 9"  (1.753 m), weight 173 lb (78.5 kg), last menstrual period 09/05/2016, unknown if currently breastfeeding.  Physical Exam  Nursing note and vitals reviewed. Constitutional:  She is oriented to person, place, and time. She appears well-developed and well-nourished. No distress.  HENT:  Head: Normocephalic.  Eyes: Pupils are equal, round, and reactive to light.  Cardiovascular: Normal rate, regular rhythm and normal heart sounds.  Respiratory: Effort normal and breath sounds normal. No respiratory distress.  GI: Soft. Bowel sounds are normal. She exhibits no distension. There is no tenderness.  Neurological: She is alert and oriented to person, place, and time. She has normal reflexes. She displays normal reflexes. No cranial nerve deficit or sensory deficit. She exhibits normal muscle tone. Coordination normal.  Skin: Skin is warm and dry.  Psychiatric: She has a normal mood and affect. Her behavior is normal. Judgment and thought content normal.    MAU Course  Procedures Results for orders placed or performed during the hospital encounter of 03/26/17 (from the past 24 hour(s))  Urinalysis, Routine w reflex microscopic     Status: Abnormal   Collection Time: 03/26/17 11:00 PM  Result Value Ref Range   Color, Urine STRAW (A) YELLOW   APPearance CLEAR CLEAR   Specific Gravity, Urine 1.004 (L) 1.005 - 1.030   pH 8.0 5.0 - 8.0   Glucose, UA NEGATIVE NEGATIVE mg/dL   Hgb urine dipstick NEGATIVE NEGATIVE   Bilirubin Urine NEGATIVE NEGATIVE   Ketones, ur NEGATIVE NEGATIVE mg/dL   Protein, ur NEGATIVE NEGATIVE mg/dL   Nitrite NEGATIVE NEGATIVE   Leukocytes, UA NEGATIVE NEGATIVE  Pregnancy, urine POC     Status: Abnormal   Collection Time: 03/26/17 11:42 PM  Result Value Ref Range   Preg Test, Ur POSITIVE (A) NEGATIVE  CBC     Status: None   Collection Time: 03/26/17 11:43 PM  Result Value Ref Range   WBC 9.6 4.0 - 10.5 K/uL   RBC 4.34 3.87 - 5.11 MIL/uL   Hemoglobin 13.3 12.0 - 15.0 g/dL   HCT 38.0 36.0 - 46.0 %   MCV 87.6 78.0 - 100.0 fL   MCH 30.6 26.0 - 34.0 pg   MCHC 35.0 30.0 - 36.0 g/dL   RDW 12.3 11.5 - 15.5 %   Platelets 190 150 - 400 K/uL   hCG, quantitative, pregnancy     Status: Abnormal   Collection Time: 03/26/17 11:43 PM  Result Value Ref Range   hCG, Beta Chain, Quant, S 156,752 (H) <5 mIU/mL   US Ob Comp Less 14 Wks  Result Date: 03/27/2017 CLINICAL DATA:  Pelvic pain EXAM: OBSTETRIC <14 WK ULTRASOUND TECHNIQUE: Transabdominal ultrasound was performed for  evaluation of the gestation as well as the maternal uterus and adnexal regions. COMPARISON:  None. FINDINGS: Intrauterine gestational sac: Single intrauterine gestation Yolk sac:  Not seen Embryo:  Visible Cardiac Activity: Visible Heart Rate: 168 bpm CRL:   22.7 mm   8 w 6 d                  [place] EDC: 10/31/2017 Subchorionic hemorrhage: Small subchorionic hemorrhage along the inferior aspect of the sac. Maternal uterus/adnexae: Bilateral ovaries are within normal limits. The right ovary measures 2.3 x 1 x 1.3 cm. The left ovary measures 2.1 x 2.9 x 1.8 cm. No significant free fluid IMPRESSION: 1. Single viable intrauterine pregnancy as above 2. Small subchorionic hemorrhage Electronically Signed   By: Donavan Foil M.D.   On: 03/27/2017 01:14   MDM UA LR bolus Phenergan 25mg  IV Fioricet PO CBC, HCG US OB Transvaginal  Reviewed with Dr. Corinna Capra- ok to discharge patient home and can use imodium for diarrhea as needed Assessment and Plan   1. Diarrhea during pregnancy   2. Abdominal pain affecting pregnancy   3. [redacted] weeks gestation of pregnancy    -Discharge home in stable condition -Rx for fioricet given to patient -Diarrhea comfort measures and BRAT diet discussed -Patient advised to follow-up with [facility] as scheduled for prenatal care -Patient may return to MAU as needed or if her condition were to change or worsen   Wende Mott CNM 03/26/2017, 11:42 PM

## 2017-03-27 ENCOUNTER — Inpatient Hospital Stay (HOSPITAL_COMMUNITY): Payer: BLUE CROSS/BLUE SHIELD

## 2017-03-27 DIAGNOSIS — R109 Unspecified abdominal pain: Secondary | ICD-10-CM | POA: Diagnosis not present

## 2017-03-27 DIAGNOSIS — O208 Other hemorrhage in early pregnancy: Secondary | ICD-10-CM | POA: Diagnosis not present

## 2017-03-27 DIAGNOSIS — R197 Diarrhea, unspecified: Secondary | ICD-10-CM | POA: Diagnosis not present

## 2017-03-27 DIAGNOSIS — O26891 Other specified pregnancy related conditions, first trimester: Secondary | ICD-10-CM

## 2017-03-27 DIAGNOSIS — Z3A08 8 weeks gestation of pregnancy: Secondary | ICD-10-CM | POA: Diagnosis not present

## 2017-03-27 LAB — CBC
HCT: 38 % (ref 36.0–46.0)
Hemoglobin: 13.3 g/dL (ref 12.0–15.0)
MCH: 30.6 pg (ref 26.0–34.0)
MCHC: 35 g/dL (ref 30.0–36.0)
MCV: 87.6 fL (ref 78.0–100.0)
PLATELETS: 190 10*3/uL (ref 150–400)
RBC: 4.34 MIL/uL (ref 3.87–5.11)
RDW: 12.3 % (ref 11.5–15.5)
WBC: 9.6 10*3/uL (ref 4.0–10.5)

## 2017-03-27 LAB — HCG, QUANTITATIVE, PREGNANCY: HCG, BETA CHAIN, QUANT, S: 156752 m[IU]/mL — AB (ref <5)

## 2017-03-27 MED ORDER — BUTALBITAL-APAP-CAFFEINE 50-325-40 MG PO TABS: 1.0000 | ORAL_TABLET | Freq: Four times a day (QID) | ORAL | 0 refills | Status: DC | PRN

## 2017-03-27 NOTE — Discharge Instructions (Signed)
Safe Medications in Pregnancy  ° °Acne: °Benzoyl Peroxide °Salicylic Acid ° °Backache/Headache: °Tylenol: 2 regular strength every 4 hours OR °             2 Extra strength every 6 hours ° °Colds/Coughs/Allergies: °Benadryl (alcohol free) 25 mg every 6 hours as needed °Breath right strips °Claritin °Cepacol throat lozenges °Chloraseptic throat spray °Cold-Eeze- up to three times per day °Cough drops, alcohol free °Flonase (by prescription only) °Guaifenesin °Mucinex °Robitussin DM (plain only, alcohol free) °Saline nasal spray/drops °Sudafed (pseudoephedrine) & Actifed ** use only after [redacted] weeks gestation and if you do not have high blood pressure °Tylenol °Vicks Vaporub °Zinc lozenges °Zyrtec  ° °Constipation: °Colace °Ducolax suppositories °Fleet enema °Glycerin suppositories °Metamucil °Milk of magnesia °Miralax °Senokot °Smooth move tea ° °Diarrhea: °Kaopectate °Imodium A-D ° °*NO pepto Bismol ° °Hemorrhoids: °Anusol °Anusol HC °Preparation H °Tucks ° °Indigestion: °Tums °Maalox °Mylanta °Zantac  °Pepcid ° °Insomnia: °Benadryl (alcohol free) 25mg every 6 hours as needed °Tylenol PM °Unisom, no Gelcaps ° °Leg Cramps: °Tums °MagGel ° °Nausea/Vomiting:  °Bonine °Dramamine °Emetrol °Ginger extract °Sea bands °Meclizine  °Nausea medication to take during pregnancy:  °Unisom (doxylamine succinate 25 mg tablets) Take one tablet daily at bedtime. If symptoms are not adequately controlled, the dose can be increased to a maximum recommended dose of two tablets daily (1/2 tablet in the morning, 1/2 tablet mid-afternoon and one at bedtime). °Vitamin B6 100mg tablets. Take one tablet twice a day (up to 200 mg per day). ° °Skin Rashes: °Aveeno products °Benadryl cream or 25mg every 6 hours as needed °Calamine Lotion °1% cortisone cream ° °Yeast infection: °Gyne-lotrimin 7 °Monistat 7 ° ° °**If taking multiple medications, please check labels to avoid duplicating the same active ingredients °**take medication as directed on  the label °** Do not exceed 4000 mg of tylenol in 24 hours °**Do not take medications that contain aspirin or ibuprofen ° ° ° ° ° °Abdominal Pain During Pregnancy °Abdominal pain is common in pregnancy. Most of the time, it does not cause harm. There are many causes of abdominal pain. Some causes are more serious than others and sometimes the cause is not known. Abdominal pain can be a sign that something is very wrong with the pregnancy or the pain may have nothing to do with the pregnancy. Always tell your health care provider if you have any abdominal pain. °Follow these instructions at home: °· Do not have sex or put anything in your vagina until your symptoms go away completely. °· Watch your abdominal pain for any changes. °· Get plenty of rest until your pain improves. °· Drink enough fluid to keep your urine clear or pale yellow. °· Take over-the-counter or prescription medicines only as told by your health care provider. °· Keep all follow-up visits as told by your health care provider. This is important. °Contact a health care provider if: °· You have a fever. °· Your pain gets worse or you have cramping. °· Your pain continues after resting. °Get help right away if: °· You are bleeding, leaking fluid, or passing tissue from the vagina. °· You have vomiting or diarrhea that does not go away. °· You have painful or bloody urination. °· You notice a decrease in your baby's movements. °· You feel very weak or faint. °· You have shortness of breath. °· You develop a severe headache with abdominal pain. °· You have abnormal vaginal discharge with abdominal pain. °This information is not intended to replace advice given   to you by your health care provider. Make sure you discuss any questions you have with your health care provider. Document Released: 02/02/2005 Document Revised: 11/14/2015 Document Reviewed: 09/01/2012 Elsevier Interactive Patient Education  2018 [place] Hematoma A  subchorionic hematoma is a gathering of blood between the outer wall of the placenta and the inner wall of the womb (uterus). The placenta is the organ that connects the fetus to the wall of the uterus. The placenta performs the feeding, breathing (oxygen to the fetus), and waste removal (excretory work) of the fetus. Subchorionic hematoma is the most common abnormality found on a result from ultrasonography done during the first trimester or early second trimester of pregnancy. If there has been little or no vaginal bleeding, early small hematomas usually shrink on their own and do not affect your baby or pregnancy. The blood is gradually absorbed over 1-2 weeks. When bleeding starts later in pregnancy or the hematoma is larger or occurs in an older pregnant woman, the outcome may not be as good. Larger hematomas may get bigger, which increases the chances for miscarriage. Subchorionic hematoma also increases the risk of premature detachment of the placenta from the uterus, preterm (premature) labor, and stillbirth. Follow these instructions at home:  Stay on bed rest if your health care provider recommends this. Although bed rest will not prevent more bleeding or prevent a miscarriage, your health care provider may recommend bed rest until you are advised otherwise.  Avoid heavy lifting (more than 10 lb [4.5 kg]), exercise, sexual intercourse, or douching as directed by your health care provider.  Keep track of the number of pads you use each day and how soaked (saturated) they are. Write down this information.  Do not use tampons.  Keep all follow-up appointments as directed by your health care provider. Your health care provider may ask you to have follow-up blood tests or ultrasound tests or both. Get help right away if:  You have severe cramps in your stomach, back, abdomen, or pelvis.  You have a fever.  You pass large clots or tissue. Save any tissue for your health care provider to look  at.  Your bleeding increases or you become lightheaded, feel weak, or have fainting episodes. This information is not intended to replace advice given to you by your health care provider. Make sure you discuss any questions you have with your health care provider. Document Released: 05/20/2006 Document Revised: 07/11/2015 Document Reviewed: 09/01/2012 Elsevier Interactive Patient Education  2017 [employer].

## 2017-03-27 NOTE — Progress Notes (Signed)
Written and verbal d/c instructions given and understanding voiced. 

## 2017-03-29 DIAGNOSIS — Z113 Encounter for screening for infections with a predominantly sexual mode of transmission: Secondary | ICD-10-CM | POA: Diagnosis not present

## 2017-03-29 DIAGNOSIS — Z3401 Encounter for supervision of normal first pregnancy, first trimester: Secondary | ICD-10-CM | POA: Diagnosis not present

## 2017-03-29 DIAGNOSIS — Z3480 Encounter for supervision of other normal pregnancy, unspecified trimester: Secondary | ICD-10-CM | POA: Diagnosis not present

## 2017-03-30 ENCOUNTER — Ambulatory Visit (HOSPITAL_COMMUNITY): Payer: BLUE CROSS/BLUE SHIELD | Attending: Cardiology

## 2017-03-30 ENCOUNTER — Other Ambulatory Visit: Payer: Self-pay

## 2017-03-30 DIAGNOSIS — R0609 Other forms of dyspnea: Secondary | ICD-10-CM | POA: Insufficient documentation

## 2017-03-30 NOTE — Addendum Note (Signed)
Addended by: Zebedee Iba on: 03/30/2017 01:37 PM   Modules accepted: Orders

## 2017-04-14 ENCOUNTER — Ambulatory Visit (INDEPENDENT_AMBULATORY_CARE_PROVIDER_SITE_OTHER): Payer: BLUE CROSS/BLUE SHIELD | Admitting: Cardiovascular Disease

## 2017-04-14 ENCOUNTER — Encounter: Payer: Self-pay | Admitting: Cardiovascular Disease

## 2017-04-14 DIAGNOSIS — Z3A12 12 weeks gestation of pregnancy: Secondary | ICD-10-CM | POA: Diagnosis not present

## 2017-04-14 DIAGNOSIS — Z3682 Encounter for antenatal screening for nuchal translucency: Secondary | ICD-10-CM | POA: Diagnosis not present

## 2017-04-14 DIAGNOSIS — R0609 Other forms of dyspnea: Secondary | ICD-10-CM | POA: Diagnosis not present

## 2017-04-14 DIAGNOSIS — O09521 Supervision of elderly multigravida, first trimester: Secondary | ICD-10-CM | POA: Diagnosis not present

## 2017-04-14 NOTE — Progress Notes (Signed)
Bryson Ha returns for follow-up of her 2-D echo performed in evaluation of dyspnea on exertion. Her echo was entirely normal. Since I saw her back weeks ago her dyspnea has somewhat improved. I do not think that her dyspnea is cardiac in nature. I will see her back after her pregnancy to reevaluate at that time.  Lorretta Harp, M.D., [place], [facility], [place] [address] [place], [place]  [address]  [phone] 04/14/2017 9:59 AM

## 2017-04-14 NOTE — Assessment & Plan Note (Signed)
Ellen Vasquez returns for follow-up of her 2-D echo performed in evaluation of dyspnea on exertion. Her echo was entirely normal. Since I saw her back weeks ago her dyspnea has somewhat improved. I do not think that her dyspnea is cardiac in nature. I will see her back after her pregnancy to reevaluate at that time.

## 2017-04-14 NOTE — Patient Instructions (Signed)
Medication Instructions: Your physician recommends that you continue on your current medications as directed. Please refer to the Current Medication list given to you today.   Follow-Up: Your physician recommends that you schedule a follow-up appointment in: October with Dr. Gwenlyn Found. You will receive a reminder letter in the mail two months in advance. If you don't receive a letter, please call our office to schedule the follow-up appointment.  If you need a refill on your cardiac medications before your next appointment, please call your pharmacy.

## 2017-04-19 ENCOUNTER — Encounter (HOSPITAL_COMMUNITY): Payer: Self-pay | Admitting: *Deleted

## 2017-04-19 ENCOUNTER — Other Ambulatory Visit: Payer: Self-pay

## 2017-04-19 ENCOUNTER — Inpatient Hospital Stay (HOSPITAL_COMMUNITY)
Admission: AD | Admit: 2017-04-19 | Discharge: 2017-04-19 | Disposition: A | Discharge status: Home / Self Care | Payer: BLUE CROSS/BLUE SHIELD | Source: Ambulatory Visit | Attending: Obstetrics and Gynecology | Admitting: Obstetrics and Gynecology

## 2017-04-19 DIAGNOSIS — Z3A13 13 weeks gestation of pregnancy: Secondary | ICD-10-CM | POA: Diagnosis not present

## 2017-04-19 DIAGNOSIS — Z882 Allergy status to sulfonamides status: Secondary | ICD-10-CM | POA: Insufficient documentation

## 2017-04-19 DIAGNOSIS — G43909 Migraine, unspecified, not intractable, without status migrainosus: Secondary | ICD-10-CM | POA: Insufficient documentation

## 2017-04-19 DIAGNOSIS — Z87891 Personal history of nicotine dependence: Secondary | ICD-10-CM | POA: Insufficient documentation

## 2017-04-19 DIAGNOSIS — K529 Noninfective gastroenteritis and colitis, unspecified: Secondary | ICD-10-CM | POA: Insufficient documentation

## 2017-04-19 DIAGNOSIS — O99611 Diseases of the digestive system complicating pregnancy, first trimester: Secondary | ICD-10-CM | POA: Diagnosis not present

## 2017-04-19 DIAGNOSIS — R197 Diarrhea, unspecified: Secondary | ICD-10-CM | POA: Diagnosis not present

## 2017-04-19 LAB — COMPREHENSIVE METABOLIC PANEL
ALT: 11 U/L — AB (ref 14–54)
AST: 18 U/L (ref 15–41)
Albumin: 3.8 g/dL (ref 3.5–5.0)
Alkaline Phosphatase: 55 U/L (ref 38–126)
Anion gap: 10 (ref 5–15)
BILIRUBIN TOTAL: 0.6 mg/dL (ref 0.3–1.2)
BUN: 7 mg/dL (ref 6–20)
CO2: 18 mmol/L — ABNORMAL LOW (ref 22–32)
CREATININE: 0.56 mg/dL (ref 0.44–1.00)
Calcium: 8.9 mg/dL (ref 8.9–10.3)
Chloride: 108 mmol/L (ref 101–111)
GFR calc Af Amer: >60 mL/min (ref >60)
Glucose, Bld: 81 mg/dL (ref 65–99)
Potassium: 3.9 mmol/L (ref 3.5–5.1)
Sodium: 136 mmol/L (ref 135–145)
TOTAL PROTEIN: 6.4 g/dL — AB (ref 6.5–8.1)

## 2017-04-19 LAB — URINALYSIS, ROUTINE W REFLEX MICROSCOPIC
BILIRUBIN URINE: NEGATIVE
Glucose, UA: NEGATIVE mg/dL
Ketones, ur: NEGATIVE mg/dL
Leukocytes, UA: NEGATIVE
Nitrite: NEGATIVE
PH: 7.5 (ref 5.0–8.0)
Protein, ur: NEGATIVE mg/dL
Specific Gravity, Urine: 1.015 (ref 1.005–1.030)

## 2017-04-19 LAB — CBC
HEMATOCRIT: 38.5 % (ref 36.0–46.0)
Hemoglobin: 13.5 g/dL (ref 12.0–15.0)
MCH: 30.3 pg (ref 26.0–34.0)
MCHC: 35.1 g/dL (ref 30.0–36.0)
MCV: 86.5 fL (ref 78.0–100.0)
Platelets: 195 10*3/uL (ref 150–400)
RBC: 4.45 MIL/uL (ref 3.87–5.11)
RDW: 12.7 % (ref 11.5–15.5)
WBC: 6 10*3/uL (ref 4.0–10.5)

## 2017-04-19 LAB — LIPASE, BLOOD: LIPASE: 32 U/L (ref 11–51)

## 2017-04-19 LAB — URINALYSIS, MICROSCOPIC (REFLEX): WBC, UA: NONE SEEN WBC/hpf (ref 0–5)

## 2017-04-19 MED ORDER — PROMETHAZINE HCL 25 MG/ML IJ SOLN
25.0000 mg | Freq: Once | INTRAVENOUS | Status: AC
  Administered 2017-04-19: 25 mg via INTRAVENOUS
  Filled 2017-04-19: qty 1

## 2017-04-19 MED ORDER — CYCLOBENZAPRINE HCL 10 MG PO TABS
10.0000 mg | ORAL_TABLET | Freq: Once | ORAL | Status: AC
  Administered 2017-04-19: 10 mg via ORAL
  Filled 2017-04-19: qty 1

## 2017-04-19 MED ORDER — DEXTROSE IN LACTATED RINGERS 5 % IV SOLN
Freq: Once | INTRAVENOUS | Status: AC
  Administered 2017-04-19: 14:00:00 via INTRAVENOUS
  Filled 2017-04-19: qty 1000

## 2017-04-19 NOTE — MAU Provider Note (Signed)
History     CSN: [id number]  Arrival date and time: 04/19/17 5465   First Provider Initiated Contact with Patient 04/19/17 1022      Chief Complaint  Patient presents with  . Emesis  . Diarrhea  . Abdominal Pain   HPI Ellen Vasquez 42 y.o. [redacted]w[redacted]d  Comes to MAU today after being up last night with diarrhea about 20 times.  Is now liquid brown stools.  Vomited yesterday about 10:30 pm.  Has not taken any Phenergan at home ("knocks her out for 14 hours with a 12.5mg  tablet) or any immodium ("wanted to let all the bacteria out of my system").  Has only had liquids fpr the past few days.  Is feeling weak and thinks she does need IV fluids.   OB History    Gravida Para Term Preterm AB Living   4 3 3     3    SAB TAB Ectopic Multiple Live Births         0 3      Past Medical History:  Diagnosis Date  . Anxiety   . Chiari malformation type I ([place])    MRI [facility] 2013  . Depression    PPD with 2nd baby  . Endometriosis 1997  . Headache   . Headache in pregnancy   . Hearing loss   . Kidney infection   . Kidney stones   . Otosclerosis 2008  . Otosclerosis     Past Surgical History:  Procedure Laterality Date  . DILATION AND CURETTAGE OF UTERUS  May 2015  . LAPAROSCOPIC ABDOMINAL EXPLORATION  06/2013   has had 4 surgeries  . SHOULDER SURGERY  2000  . TONSILLECTOMY AND ADENOIDECTOMY  2010  . WISDOM TOOTH EXTRACTION  2008    Family History  Problem Relation Age of Onset  . Asthma Father   . Arthritis Father        Rheumatoid  . Cancer Maternal Grandmother        skin  . Hypertension Other   . Hyperlipidemia Other   . Stroke Other   . Obesity Other   . Aneurysm Paternal Grandfather        brain  . Skin cancer Paternal Grandmother   . Migraines Neg Hx     Social History   Tobacco Use  . Smoking status: Former Smoker    Types: Cigarettes    Last attempt to quit: 11/25/2001    Years since quitting: 15.4  . Smokeless tobacco: Never Used  Substance Use  Topics  . Alcohol use: Yes    Alcohol/week: 0.6 oz    Types: 1 Glasses of wine per week    Comment: social events  . Drug use: No    Allergies:  Allergies  Allergen Reactions  . Shellfish Allergy Hives  . Verapamil     GI sx, weakness  . Other     Surgical glue causes a rash  . Sulfa Antibiotics Dermatitis and Rash    Medications Prior to Admission  Medication Sig Dispense Refill Last Dose  . butalbital-acetaminophen-caffeine (FIORICET, ESGIC) 50-325-40 MG tablet Take 1-2 tablets by mouth every 6 (six) hours as needed for headache. 30 tablet 0 prn  . Prenat w/o A-FeCbGl-DSS-FA-DHA (CITRANATAL ASSURE PO) Take 1 tablet by mouth daily.   Past Week at Unknown time  . sertraline (ZOLOFT) 100 MG tablet Take 100 mg by mouth daily.   04/18/2017 at Unknown time    Review of Systems  Constitutional: Negative for fever.  Gastrointestinal: Positive for abdominal pain, diarrhea, nausea and vomiting.  Genitourinary: Negative for dysuria, vaginal bleeding and vaginal discharge.   Physical Exam   Blood pressure 117/73, pulse 95, temperature 98.5 F (36.9 C), temperature source Oral, resp. rate 18, weight 167 lb 8 oz (76 kg), last menstrual period 09/05/2016, SpO2 100 %, unknown if currently breastfeeding.  Physical Exam  Nursing note and vitals reviewed. Constitutional: She is oriented to person, place, and time. She appears well-developed and well-nourished.  HENT:  Head: Normocephalic.  Eyes: EOM are normal.  Neck: Neck supple.  Respiratory: Effort normal.  GI: Soft. There is tenderness. There is no rebound and no guarding.  Hyperactive bowel sounds in upper abdomen and normal bowel sounds in lower quadrants.  No rebound.  Mild diffuse abdominal tenderness with gentle palpation.  FHT 151 by doppler.  Musculoskeletal: Normal range of motion.  Neurological: She is alert and oriented to person, place, and time.  Skin: Skin is warm and dry.  Psychiatric: She has a normal mood and affect.     MAU Course  Procedures Results for orders placed or performed during the hospital encounter of 04/19/17 (from the past 24 hour(s))  Urinalysis, Routine w reflex microscopic     Status: Abnormal   Collection Time: 04/19/17  9:52 AM  Result Value Ref Range   Color, Urine YELLOW YELLOW   APPearance CLEAR CLEAR   Specific Gravity, Urine 1.015 1.005 - 1.030   pH 7.5 5.0 - 8.0   Glucose, UA NEGATIVE NEGATIVE mg/dL   Hgb urine dipstick TRACE (A) NEGATIVE   Bilirubin Urine NEGATIVE NEGATIVE   Ketones, ur NEGATIVE NEGATIVE mg/dL   Protein, ur NEGATIVE NEGATIVE mg/dL   Nitrite NEGATIVE NEGATIVE   Leukocytes, UA NEGATIVE NEGATIVE  Urinalysis, Microscopic (reflex)     Status: Abnormal   Collection Time: 04/19/17  9:52 AM  Result Value Ref Range   RBC / HPF 0-5 0 - 5 RBC/hpf   WBC, UA NONE SEEN 0 - 5 WBC/hpf   Bacteria, UA RARE (A) NONE SEEN   Squamous Epithelial / LPF 0-5 (A) NONE SEEN  CBC     Status: None   Collection Time: 04/19/17 11:40 AM  Result Value Ref Range   WBC 6.0 4.0 - 10.5 K/uL   RBC 4.45 3.87 - 5.11 MIL/uL   Hemoglobin 13.5 12.0 - 15.0 g/dL   HCT 38.5 36.0 - 46.0 %   MCV 86.5 78.0 - 100.0 fL   MCH 30.3 26.0 - 34.0 pg   MCHC 35.1 30.0 - 36.0 g/dL   RDW 12.7 11.5 - 15.5 %   Platelets 195 150 - 400 K/uL  Comprehensive metabolic panel     Status: Abnormal   Collection Time: 04/19/17 11:40 AM  Result Value Ref Range   Sodium 136 135 - 145 mmol/L   Potassium 3.9 3.5 - 5.1 mmol/L   Chloride 108 101 - 111 mmol/L   CO2 18 (L) 22 - 32 mmol/L   Glucose, Bld 81 65 - 99 mg/dL   BUN 7 6 - 20 mg/dL   Creatinine, Ser 0.56 0.44 - 1.00 mg/dL   Calcium 8.9 8.9 - 10.3 mg/dL   Total Protein 6.4 (L) 6.5 - 8.1 g/dL   Albumin 3.8 3.5 - 5.0 g/dL   AST 18 15 - 41 U/L   ALT 11 (L) 14 - 54 U/L   Alkaline Phosphatase 55 38 - 126 U/L   Total Bilirubin 0.6 0.3 - 1.2 mg/dL   GFR calc non  Af Amer >60 >60 mL/min   GFR calc Af Amer >60 >60 mL/min   Anion gap 10 5 - 15  Lipase, blood      Status: None   Collection Time: 04/19/17 11:40 AM  Result Value Ref Range   Lipase 32 11 - 51 U/L   MDM Consult with Dr. Gaetano Net - agrees with the plan of care IVF with phenergan and IV of multivitamins Client developed left facial numbness which she states means she will get a migraine - asking for medication.  Does not yet have a headache.  Flexeril 10 mg given PO before she left the hospital as prophylaxis for headache. Had no vomiting and no diarrhea stools while in MAU.  Able to take sips of ginger ale with no vomiting or diarrhea before leaving.  Assessment and Plan  Gastroenteritis - no evidence of appendicitis History of migraines - flexeril 10 mg given as prophylaxis as she has some facial numbness on the side of her face - usual symptom before getting a migraine  Plan Use the BRAT diet = bananas, applesauce, rice and toast - no butter, no oils.  Advance slowly as tolerated. Eat small amounts every 1-2 hours. Can use immodium. Drink at least 8 8-oz glasses of water every day.  Drink small amounts every 30 minutes. If your symptoms persist and you are not doing well, schedule an appointment in the office this week.   Terri L Burleson 04/19/2017, 10:47 AM

## 2017-04-19 NOTE — Discharge Instructions (Signed)
Use the BRAT diet = bananas, applesauce, rice and toast - no butter, no oils.  Advance slowly as tolerated. Eat small amounts every 1-2 hours. Can use immodium. Drink at least 8 8-oz glasses of water every day.  Drink small amounts every 30 minutes. If your symptoms persist and you are not doing well, schedule an appointment in the office this week.

## 2017-04-19 NOTE — MAU Note (Signed)
Severe diarrhea and vomiting.started yesterday and has continued. Up all night

## 2017-04-19 NOTE — Progress Notes (Deleted)
[facility]  PATIENT: Ellen Vasquez DOB: 07-09-1975   REASON FOR VISIT: Follow-up of her headaches HISTORY FROM: Patient    HISTORY OF PRESENT ILLNESS:UPDATE 11/9/2018CM .Ellen Vasquez, 42 year old female returns for follow-up with a history of chronic headaches.  She was last seen in the office 08/2015 for Botox which was very beneficial she claims. She has had Onzetra to use acutely for her headaches in the past but she has so much pushback from her insurance company because they do not want to pay for this.  She wants to be switched to just regular Imitrex.  She has some form of headache every day she has a bad migraine with visual changes and stabbing pain every few weeks.  The pain in her head starts in the back of her head and eventually spreads to the whole head with a squeezing type quality.  She also has bilateral otosclerosis and has hearing aids both ears.  She complains with ringing in the ears.  Stress is a trigger.  She has tried and failed verapamil and Topamax, nortriptyline and propanolol.  She is currently being treated for a sinus infection.  She returns for reevaluation  Ellen Vasquez is a 42 y.o. female to follow up  for chronic headaches.  She has a chiari malformation and she has been evaluated at [place] and no neurosurgical intervention was needed. Migraines started in teenage years. Migraines with the visual disturbances in her 37s. She has 30 headache days a month, she has at least 15 migraines a month. Daily headaches are  unilateral on the left like a screw going through her eye, stabbing. The migraines are on the right side, then spreads to the whole head, pain in the neck traveling to the cervical area, continuous squeezing, she feels like she is being pulled to the right, she has to go into a dark room and lay down which helps, sound sensitivity, nausea and vomiitng. Can last days and can be 10/10. It has been at this  frequency for years. Stress is a trigger. No food triggers. No medication overuse. From a review of records, she has tried and failed several first-line medications for migraines: verapamil - had side effects, Topamax - C/I due to nephrolithiasis, Nortriptyline- had side effects of excess weight gain on a very low dose. She was recently prescribed Dan [place] and states it works. She tried sumatriptan pills with severe nausea and vomited. She stopped propanolol. Her headache today is due to stress and  she has a function to go to at the college tonight. She returns for reevaluation. She thought she was going to be getting Botox today, prior auth started at last visit with Dr. Jaynee Eagles on 04/01/2015. She does want to  discuss Botox and my thoughts on this.    REVIEW OF SYSTEMS: Full 14 system review of systems performed and notable only for those listed, all others are neg:  Constitutional: neg  Cardiovascular: neg Ear/Nose/Throat: Hearing loss, ringing in the ears Skin: neg Eyes: neg Respiratory: neg Gastroitestinal: neg  Hematology/Lymphatic: neg  Endocrine: neg Musculoskeletal: Back pain Allergy/Immunology: neg Neurological: Dizziness headache numbness Psychiatric: Anxiety Sleep : neg   ALLERGIES: Allergies  Allergen Reactions  . Shellfish Allergy Hives  . Verapamil     GI sx, weakness  . Other     Surgical glue causes a rash  . Sulfa Antibiotics Dermatitis and Rash    HOME MEDICATIONS: Facility-Administered Medications Prior to Visit  Medication Dose Route Frequency Provider  Last Rate Last Dose  . dextrose 5% lactated ringers 1,000 mL with multivitamins adult (MVI -12) 10 mL infusion   Intravenous Once Burleson, Terri L, NP      . gadopentetate dimeglumine (MAGNEVIST) injection 15 mL  15 mL Intravenous Once PRN Dohmeier, Asencion Partridge, MD      . promethazine (PHENERGAN) 25 mg in lactated ringers 1,000 mL infusion  25 mg Intravenous Once Burleson, Terri L, NP       Outpatient  Medications Prior to Visit  Medication Sig Dispense Refill  . butalbital-acetaminophen-caffeine (FIORICET, ESGIC) 50-325-40 MG tablet Take 1-2 tablets by mouth every 6 (six) hours as needed for headache. 30 tablet 0  . Prenat w/o A-FeCbGl-DSS-FA-DHA (CITRANATAL ASSURE PO) Take 1 tablet by mouth daily.    . sertraline (ZOLOFT) 100 MG tablet Take 100 mg by mouth daily.      PAST MEDICAL HISTORY: Past Medical History:  Diagnosis Date  . Anxiety   . Chiari malformation type I ([place])    MRI [facility] 2013  . Depression    PPD with 2nd baby  . Endometriosis 1997  . Headache   . Headache in pregnancy   . Hearing loss   . Kidney infection   . Kidney stones   . Otosclerosis 2008  . Otosclerosis     PAST SURGICAL HISTORY: Past Surgical History:  Procedure Laterality Date  . DILATION AND CURETTAGE OF UTERUS  May 2015  . LAPAROSCOPIC ABDOMINAL EXPLORATION  06/2013   has had 4 surgeries  . SHOULDER SURGERY  2000  . TONSILLECTOMY AND ADENOIDECTOMY  2010  . WISDOM TOOTH EXTRACTION  2008    FAMILY HISTORY: Family History  Problem Relation Age of Onset  . Asthma Father   . Arthritis Father        Rheumatoid  . Cancer Maternal Grandmother        skin  . Hypertension Other   . Hyperlipidemia Other   . Stroke Other   . Obesity Other   . Aneurysm Paternal Grandfather        brain  . Skin cancer Paternal Grandmother   . Migraines Neg Hx     SOCIAL HISTORY: Social History   Socioeconomic History  . Marital status: Married    Spouse name: Not on file  . Number of children: 2  . Years of education: Phd ED  . Highest education level: Not on file  Social Needs  . Financial resource strain: Not on file  . Food insecurity - worry: Not on file  . Food insecurity - inability: Not on file  . Transportation needs - medical: Not on file  . Transportation needs - non-medical: Not on file  Occupational History  . Occupation: Professor    Comment: Hydrographic surveyor  Tobacco Use  . Smoking  status: Former Smoker    Types: Cigarettes    Last attempt to quit: 11/25/2001    Years since quitting: 15.4  . Smokeless tobacco: Never Used  Substance and Sexual Activity  . Alcohol use: Yes    Alcohol/week: 0.6 oz    Types: 1 Glasses of wine per week    Comment: social events  . Drug use: No  . Sexual activity: Yes    Birth control/protection: None  Other Topics Concern  . Not on file  Social History Narrative   Caffeine none (occasional soda).     PHYSICAL EXAM  There were no vitals filed for this visit. There is no height or weight on file to calculate  BMI.  Generalized: Well developed, in no acute distress  Head: normocephalic and atraumatic,. Oropharynx benign  Neck: Supple,  Musculoskeletal: No deformity   Neurological examination   Mentation: Alert oriented to time, place, history taking. Attention span and concentration appropriate. Recent and remote memory intact.  Follows all commands speech and language fluent.   Cranial nerve II-XII: Pupils were equal round reactive to light extraocular movements were full, visual field were full on confrontational test. Facial sensation and strength were normal.Bil hearing aids. Uvula tongue midline. head turning and shoulder shrug were normal and symmetric.Tongue protrusion into cheek strength was normal. Motor: normal bulk and tone, full strength in the BUE, BLE, fine finger movements normal, no pronator drift. No focal weakness Sensory: normal and symmetric to light touch,  Coordination: finger-nose-finger, heel-to-shin bilaterally, no dysmetria Reflexes: Symmetric upper and lower plantar responses were flexor bilaterally. Gait and Station: Rising up from seated position without assistance, normal stance,  moderate stride, good arm swing, smooth turning, able to perform tiptoe, and heel walking without difficulty. Tandem gait is steady  DIAGNOSTIC DATA (LABS, IMAGING, TESTING) - I reviewed patient records, labs, notes,  testing and imaging myself where available.  Lab Results  Component Value Date   WBC 9.6 03/26/2017   HGB 13.3 03/26/2017   HCT 38.0 03/26/2017   MCV 87.6 03/26/2017   PLT 190 03/26/2017      Component Value Date/Time   NA 133 (L) 08/25/2014 0125   K 3.3 (L) 08/25/2014 0125   CL 106 08/25/2014 0125   CO2 23 08/25/2014 0125   GLUCOSE 115 (H) 08/25/2014 0125   BUN 6 08/25/2014 0125   CREATININE 0.71 08/25/2014 0125   CALCIUM 8.3 (L) 08/25/2014 0125   PROT 5.5 (L) 08/25/2014 0125   ALBUMIN 3.1 (L) 08/25/2014 0125   AST 19 08/25/2014 0125   ALT 12 (L) 08/25/2014 0125   ALKPHOS 128 (H) 08/25/2014 0125   BILITOT 0.3 08/25/2014 0125   GFRNONAA >60 08/25/2014 0125   GFRAA >60 08/25/2014 0125    ASSESSMENT AND PLAN  42 y.o. year old female  has a past medical history of Anxiety, Chiari malformation type I ([place]), Depression, Endometriosis (1997), Headache, Headache in pregnancy, Hearing loss, , Otosclerosis (2008), and Otosclerosis. here to follow-up for her daily headache.She has tried and failed verapamil and Topamax, nortriptyline and propanolol.  She had Botox in July of last year and it was very beneficial.  PLAN: Imitrex 100 p.o. as needed acute headache will refill Continue Zoloft 100 mg daily will refill Patient will be set up for Botox with Dr. Jaynee Eagles  F/U for botox Dennie Bible, [facility], [facility], [facility] [address] [place], [place] [address] [phone]

## 2017-04-20 ENCOUNTER — Ambulatory Visit: Payer: BLUE CROSS/BLUE SHIELD | Admitting: Nurse Practitioner

## 2017-04-20 ENCOUNTER — Telehealth: Payer: Self-pay | Admitting: *Deleted

## 2017-04-20 NOTE — Telephone Encounter (Signed)
Patient called today to cancel follow up. She was seen in hospital yesterday for illness.

## 2017-04-21 ENCOUNTER — Encounter (HOSPITAL_COMMUNITY): Payer: Self-pay | Admitting: *Deleted

## 2017-04-21 ENCOUNTER — Other Ambulatory Visit: Payer: Self-pay

## 2017-04-21 ENCOUNTER — Observation Stay (HOSPITAL_COMMUNITY): Payer: BLUE CROSS/BLUE SHIELD

## 2017-04-21 ENCOUNTER — Encounter: Payer: Self-pay | Admitting: Nurse Practitioner

## 2017-04-21 ENCOUNTER — Inpatient Hospital Stay (HOSPITAL_COMMUNITY)
Admission: AD | Admit: 2017-04-21 | Discharge: 2017-04-24 | DRG: 833 | Disposition: A | Discharge status: Home / Self Care | Payer: BLUE CROSS/BLUE SHIELD | Source: Ambulatory Visit | Attending: Obstetrics & Gynecology | Admitting: Obstetrics & Gynecology

## 2017-04-21 DIAGNOSIS — R198 Other specified symptoms and signs involving the digestive system and abdomen: Secondary | ICD-10-CM | POA: Diagnosis not present

## 2017-04-21 DIAGNOSIS — R110 Nausea: Secondary | ICD-10-CM | POA: Diagnosis not present

## 2017-04-21 DIAGNOSIS — B9620 Unspecified Escherichia coli [E. coli] as the cause of diseases classified elsewhere: Secondary | ICD-10-CM | POA: Diagnosis present

## 2017-04-21 DIAGNOSIS — R197 Diarrhea, unspecified: Secondary | ICD-10-CM | POA: Diagnosis present

## 2017-04-21 DIAGNOSIS — R1110 Vomiting, unspecified: Secondary | ICD-10-CM | POA: Diagnosis not present

## 2017-04-21 DIAGNOSIS — Z882 Allergy status to sulfonamides status: Secondary | ICD-10-CM | POA: Diagnosis not present

## 2017-04-21 DIAGNOSIS — O211 Hyperemesis gravidarum with metabolic disturbance: Secondary | ICD-10-CM | POA: Diagnosis not present

## 2017-04-21 DIAGNOSIS — O210 Mild hyperemesis gravidarum: Secondary | ICD-10-CM | POA: Diagnosis not present

## 2017-04-21 DIAGNOSIS — A498 Other bacterial infections of unspecified site: Secondary | ICD-10-CM | POA: Diagnosis present

## 2017-04-21 DIAGNOSIS — K529 Noninfective gastroenteritis and colitis, unspecified: Secondary | ICD-10-CM | POA: Diagnosis not present

## 2017-04-21 DIAGNOSIS — A040 Enteropathogenic Escherichia coli infection: Secondary | ICD-10-CM | POA: Diagnosis not present

## 2017-04-21 DIAGNOSIS — Z3A14 14 weeks gestation of pregnancy: Secondary | ICD-10-CM | POA: Diagnosis not present

## 2017-04-21 DIAGNOSIS — R634 Abnormal weight loss: Secondary | ICD-10-CM | POA: Diagnosis present

## 2017-04-21 DIAGNOSIS — O9989 Other specified diseases and conditions complicating pregnancy, childbirth and the puerperium: Secondary | ICD-10-CM | POA: Diagnosis not present

## 2017-04-21 DIAGNOSIS — A09 Infectious gastroenteritis and colitis, unspecified: Secondary | ICD-10-CM | POA: Diagnosis not present

## 2017-04-21 DIAGNOSIS — O26891 Other specified pregnancy related conditions, first trimester: Secondary | ICD-10-CM | POA: Diagnosis not present

## 2017-04-21 DIAGNOSIS — Z87891 Personal history of nicotine dependence: Secondary | ICD-10-CM | POA: Diagnosis not present

## 2017-04-21 DIAGNOSIS — R109 Unspecified abdominal pain: Secondary | ICD-10-CM | POA: Diagnosis not present

## 2017-04-21 DIAGNOSIS — Z3A13 13 weeks gestation of pregnancy: Secondary | ICD-10-CM | POA: Diagnosis not present

## 2017-04-21 DIAGNOSIS — O219 Vomiting of pregnancy, unspecified: Secondary | ICD-10-CM | POA: Diagnosis not present

## 2017-04-21 DIAGNOSIS — B9629 Other Escherichia coli [E. coli] as the cause of diseases classified elsewhere: Secondary | ICD-10-CM | POA: Diagnosis not present

## 2017-04-21 DIAGNOSIS — O26899 Other specified pregnancy related conditions, unspecified trimester: Secondary | ICD-10-CM | POA: Diagnosis not present

## 2017-04-21 DIAGNOSIS — O99611 Diseases of the digestive system complicating pregnancy, first trimester: Secondary | ICD-10-CM | POA: Diagnosis not present

## 2017-04-21 LAB — COMPREHENSIVE METABOLIC PANEL
ALBUMIN: 4 g/dL (ref 3.5–5.0)
ALK PHOS: 52 U/L (ref 38–126)
ALT: 14 U/L (ref 14–54)
ANION GAP: 8 (ref 5–15)
AST: 20 U/L (ref 15–41)
BILIRUBIN TOTAL: 0.7 mg/dL (ref 0.3–1.2)
BUN: 8 mg/dL (ref 6–20)
CALCIUM: 8.9 mg/dL (ref 8.9–10.3)
CO2: 20 mmol/L — ABNORMAL LOW (ref 22–32)
Chloride: 107 mmol/L (ref 101–111)
Creatinine, Ser: 0.58 mg/dL (ref 0.44–1.00)
GFR calc non Af Amer: >60 mL/min (ref >60)
Glucose, Bld: 74 mg/dL (ref 65–99)
POTASSIUM: 3.8 mmol/L (ref 3.5–5.1)
SODIUM: 135 mmol/L (ref 135–145)
TOTAL PROTEIN: 6.8 g/dL (ref 6.5–8.1)

## 2017-04-21 LAB — CBC
HEMATOCRIT: 37.4 % (ref 36.0–46.0)
HEMOGLOBIN: 13.3 g/dL (ref 12.0–15.0)
MCH: 30.8 pg (ref 26.0–34.0)
MCHC: 35.6 g/dL (ref 30.0–36.0)
MCV: 86.6 fL (ref 78.0–100.0)
Platelets: 199 10*3/uL (ref 150–400)
RBC: 4.32 MIL/uL (ref 3.87–5.11)
RDW: 12.5 % (ref 11.5–15.5)
WBC: 7.1 10*3/uL (ref 4.0–10.5)

## 2017-04-21 LAB — LIPASE, BLOOD: LIPASE: 32 U/L (ref 11–51)

## 2017-04-21 LAB — AMYLASE: AMYLASE: 81 U/L (ref 28–100)

## 2017-04-21 LAB — TYPE AND SCREEN
ABO/RH(D): B POS
ANTIBODY SCREEN: NEGATIVE

## 2017-04-21 MED ORDER — DEXTROSE IN LACTATED RINGERS 5 % IV SOLN
INTRAVENOUS | Status: DC
  Administered 2017-04-21 – 2017-04-23 (×5): via INTRAVENOUS

## 2017-04-21 MED ORDER — PRENATAL MULTIVITAMIN CH
1.0000 | ORAL_TABLET | Freq: Every day | ORAL | Status: DC
  Administered 2017-04-22 – 2017-04-24 (×3): 1 via ORAL
  Filled 2017-04-21 (×3): qty 1

## 2017-04-21 MED ORDER — ACETAMINOPHEN 325 MG PO TABS
650.0000 mg | ORAL_TABLET | ORAL | Status: DC | PRN
  Administered 2017-04-22 (×2): 650 mg via ORAL
  Filled 2017-04-21 (×3): qty 2

## 2017-04-21 MED ORDER — CALCIUM CARBONATE ANTACID 500 MG PO CHEW: 2.0000 | CHEWABLE_TABLET | ORAL | Status: DC | PRN

## 2017-04-21 MED ORDER — ZOLPIDEM TARTRATE 5 MG PO TABS
5.0000 mg | ORAL_TABLET | Freq: Every evening | ORAL | Status: DC | PRN
  Administered 2017-04-22 – 2017-04-23 (×3): 5 mg via ORAL
  Filled 2017-04-21 (×3): qty 1

## 2017-04-21 MED ORDER — ONDANSETRON HCL 4 MG/2ML IJ SOLN
4.0000 mg | Freq: Four times a day (QID) | INTRAMUSCULAR | Status: DC | PRN
  Administered 2017-04-21 – 2017-04-23 (×2): 4 mg via INTRAVENOUS
  Filled 2017-04-21 (×2): qty 2

## 2017-04-21 MED ORDER — DOCUSATE SODIUM 100 MG PO CAPS
100.0000 mg | ORAL_CAPSULE | Freq: Every day | ORAL | Status: DC
  Filled 2017-04-21: qty 1

## 2017-04-21 NOTE — Consult Note (Addendum)
Referring Provider: Primary Care Physician:  Aretta Nip, MD Primary Gastroenterologist:  Molli Posey,  MD  Reason for Consultation:    diarrhea , nausea / vomiting and weight loss.    ASSESSMENT AND PLAN:    42 yo female, [redacted] weeks gestation, admitted with weight loss, non-bloody diarrhea, nausea and vomiting since January.  She took a course of antibiotics in December.  -Await C-diff results which has been ordered but not collected. Patient hasn't had a BM since taking 4 imodium yesterday. .  -continue supportive care with prn IVF.  -Her abdominal exam is not overly concerning so AFTER submission of stool samples she can temporize symptoms with imodium if having excessive diarrhea -Will also make sure stool pathogen panel ordered -If stool studies negative and diarrhea continues then patient will need further workup    HPI: Ellen Vasquez is a 42 y.o. female who is [redacted] weeks pregnant and currently admitted for diarrhea with weight loss.  Diarrhea started in January around the time found out she was pregnant.  Prior to that patient's bowel movements were normal though she has problems with chronic intermittent constipation and takes MiraLAX as needed . In addition to the diarrhea patient has also had nausea and vomiting felt to be out of proportion to early pregnancy.  This is patient's fourth pregnancy, she did not have diarrhea with any of the previous pregnancies.  Since January the diarrhea has waxed and waned but over the last few days it has been severe.  She has not had any diarrhea or stools today after taking for Imodium yesterday . Prior to that she had been having up to 20 liquid bowel movements a day.  No significant nocturnal stooling.  Stool does not contain any blood.  There has been associated mild lower abdominal discomfort and patient initially thought she was losing the baby.  No fevers at home.  No skin rashes, arthralgias or myalgias.  Patient has well water  at home but she drinks bottled or filter of water.. No recent out of the country travel.  No known contact with people having similar symptoms.  She has no family history of IBD or other gastrointestinal diseases.  Patient takes Zoloft and multiple vitamins when she can tolerate them.  No recent changes in dosages of Zoloft.  She takes an occasional Tylenol for headaches . No other medications.  She did take a course of antibiotics in December for ear infection  Past Medical History:  Diagnosis Date  . Anxiety   . Chiari malformation type I ([place])    MRI [facility] 2013  . Depression    PPD with 2nd baby  . Endometriosis 1997  . Headache   . Headache in pregnancy   . Hearing loss   . Kidney infection   . Kidney stones   . Otosclerosis 2008  . Otosclerosis     Past Surgical History:  Procedure Laterality Date  . DILATION AND CURETTAGE OF UTERUS  May 2015  . LAPAROSCOPIC ABDOMINAL EXPLORATION  06/2013   has had 4 surgeries  . SHOULDER SURGERY  2000  . TONSILLECTOMY AND ADENOIDECTOMY  2010  . [place] EXTRACTION  2008    Prior to Admission medications   Medication Sig Start Date End Date Taking? Authorizing Provider  acetaminophen (TYLENOL) 500 MG tablet Take 1,000 mg by mouth every 6 (six) hours as needed for headache.   Yes [provider]  calcium carbonate (TUMS - DOSED IN MG ELEMENTAL CALCIUM) 500  MG chewable tablet Chew 2 tablets by mouth daily as needed for indigestion or heartburn.   Yes [provider]  sertraline (ZOLOFT) 100 MG tablet Take 100 mg by mouth daily.   Yes [provider]  butalbital-acetaminophen-caffeine (FIORICET, ESGIC) 50-325-40 MG tablet Take 1-2 tablets by mouth every 6 (six) hours as needed for headache. Patient not taking: Reported on 04/21/2017 03/27/17 03/27/18  Wende Mott, CNM  Prenat w/o A-FeCbGl-DSS-FA-DHA (CITRANATAL ASSURE PO) Take 1 tablet by mouth daily.    [provider]    Current Facility-Administered  Medications  Medication Dose Route Frequency Provider Last Rate Last Dose  . acetaminophen (TYLENOL) tablet 650 mg  650 mg Oral Q4H PRN Molli Posey, MD      . calcium carbonate (TUMS - dosed in mg elemental calcium) chewable tablet 400 mg of elemental calcium  2 tablet Oral Q4H PRN Molli Posey, MD      . dextrose 5 % in lactated ringers infusion   Intravenous Continuous Molli Posey, MD      . docusate sodium (COLACE) capsule 100 mg  100 mg Oral Daily Molli Posey, MD      . ondansetron [facility]) injection 4 mg  4 mg Intravenous Q6H PRN Molli Posey, MD      . prenatal multivitamin tablet 1 tablet  1 tablet Oral Q1200 Molli Posey, MD      . zolpidem (AMBIEN) tablet 5 mg  5 mg Oral QHS PRN Molli Posey, MD       Facility-Administered Medications Ordered in Other Encounters  Medication Dose Route Frequency Provider Last Rate Last Dose  . gadopentetate dimeglumine (MAGNEVIST) injection 15 mL  15 mL Intravenous Once PRN Dohmeier, Asencion Partridge, MD        Allergies as of 04/19/2017 - Review Complete 04/19/2017  Allergen Reaction Noted  . Shellfish allergy Hives 01/04/2013  . Verapamil  03/13/2015  . Other  06/12/2014  . Sulfa antibiotics Dermatitis and Rash 06/12/2014    Family History  Problem Relation Age of Onset  . Asthma Father   . Arthritis Father        Rheumatoid  . Cancer Maternal Grandmother        skin  . Hypertension Other   . Hyperlipidemia Other   . Stroke Other   . Obesity Other   . Aneurysm Paternal Grandfather        brain  . Skin cancer Paternal Grandmother   . Migraines Neg Hx     Social History   Socioeconomic History  . Marital status: Married    Spouse name: Not on file  . Number of children: 2  . Years of education: Phd ED  . Highest education level: Not on file  Social Needs  . Financial resource strain: Not on file  . Food insecurity - worry: Not on file  . Food insecurity - inability: Not on file  . Transportation needs -  medical: Not on file  . Transportation needs - non-medical: Not on file  Occupational History  . Occupation: Professor    Comment: Hydrographic surveyor  Tobacco Use  . Smoking status: Former Smoker    Types: Cigarettes    Last attempt to quit: 11/25/2001    Years since quitting: 15.4  . Smokeless tobacco: Never Used  Substance and Sexual Activity  . Alcohol use: Yes    Alcohol/week: 0.6 oz    Types: 1 Glasses of wine per week    Comment: social events  . Drug use: No  .  Sexual activity: Yes    Birth control/protection: None  Other Topics Concern  . Not on file  Social History Narrative   Caffeine none (occasional soda).    Review of Systems: All systems reviewed and negative except where noted in HPI.  Physical Exam: Vital signs in last 24 hours: Temp:  [98 F (36.7 C)] 98 F (36.7 C) (03/06 1105) Pulse Rate:  [79] 79 (03/06 1105) Resp:  [18] 18 (03/06 1105) BP: (113)/(67) 113/67 (03/06 1105) SpO2:  [100 %] 100 % (03/06 1105) Weight:  [167 lb (75.8 kg)] 167 lb (75.8 kg) (03/06 1124)   General:   Alert, well-developed,  White female in NAD Psych:  Pleasant, cooperative. Normal mood and affect. Eyes:  Pupils equal, sclera clear, no icterus.   Conjunctiva pink. Ears:  Normal auditory acuity. Nose:  No deformity, discharge,  or lesions. Neck:  Supple; no masses Lungs:  Clear throughout to auscultation.   No wheezes, crackles, or rhonchi.  Heart:  Regular rate and rhythm; no murmurs, no edema Abdomen:  Soft, non-distended, nontender, BS active, no palp mass    Rectal:  Deferred  Msk:  Symmetrical without gross deformities. . Neurologic:  Alert and  oriented x4;  grossly normal neurologically. Skin:  Intact without significant lesions or rashes..   Intake/Output from previous day: No intake/output data recorded. Intake/Output this shift: No intake/output data recorded.  Lab Results: Recent Labs    04/19/17 1140 04/21/17 1152  WBC 6.0 7.1  HGB 13.5 13.3  HCT 38.5  37.4  PLT 195 199   BMET Recent Labs    04/19/17 1140 04/21/17 1152  NA 136 135  K 3.9 3.8  CL 108 107  CO2 18* 20*  GLUCOSE 81 74  BUN 7 8  CREATININE 0.56 0.58  CALCIUM 8.9 8.9   LFT Recent Labs    04/21/17 1152  PROT 6.8  ALBUMIN 4.0  AST 20  ALT 14  ALKPHOS 52  BILITOT 0.7    Studies/Results: US Abdomen Limited Ruq  Result Date: 04/21/2017 CLINICAL DATA:  42 year old female with abdominal pain vomiting and diarrhea in the 1st trimester of pregnancy. EXAM: ULTRASOUND ABDOMEN LIMITED RIGHT UPPER QUADRANT COMPARISON:  Ob ultrasound 03/27/2017. CT Abdomen and Pelvis [facility] 02/13/2015 FINDINGS: Gallbladder: No gallstones or wall thickening visualized. No sonographic Murphy sign noted by sonographer. Common bile duct: Diameter: 5 millimeters, within normal limits. Liver: No intrahepatic biliary ductal dilatation is evident. Liver echogenicity is within normal limits. No discrete liver lesion. Portal vein is patent on color Doppler imaging with normal direction of blood flow towards the liver. Other findings: Negative visible right kidney. IMPRESSION: Normal gallbladder. Right upper quadrant ultrasound is within normal limits. Electronically Signed   By: Genevie Ann M.D.   On: 04/21/2017 14:48     Tye Savoy, NP-C @  04/21/2017, 2:55 PM  Pager number [phone]   Attending physician's note   I have taken a history, examined the patient and reviewed the chart. I agree with the Advanced Practitioner's note, impression and recommendations. 62 yr F, [redacted] weeks pregnant with loose watery diarrhea for past 5-6 weeks, acute exacerbation in last 2 days. Abdomen is soft with no distension and non tender. Will need to exclude infectious etiology first. F/u C.diff and GI pathogen panel. Avoid Imodium until its confirmed doesn't have any active infection.  Continue IV fluids and supportive care  K Denzil Magnuson, MD [phone] Mon-Fri 8a-5p [phone] after 5p,  weekends, holidays

## 2017-04-21 NOTE — H&P (Signed)
NAME:  Ellen Vasquez, Ellen Vasquez               ACCOUNT NO.:  MEDICAL RECORD NO.:  [id number]  LOCATION:                                 FACILITY:  PHYSICIAN:  Ralene Bathe. Matthew Saras, M.D.DATE OF BIRTH:  [birth year]  DATE OF ADMISSION: DATE OF DISCHARGE:                             HISTORY & PHYSICAL   CHIEF COMPLAINT:  Persistent nausea, vomiting, diarrhea, and weight loss in pregnancy.  HISTORY OF PRESENT ILLNESS:  A 42 year old, G4, P3-0-0-3 at 13 weeks 2 days.  She has had repeated problems with nausea, vomiting, and diarrhea that have not responded to Imodium and antiemetics.  She has had trips to [facility]U for hydration, but continues to be miserable with these symptoms. In the office today, FHR was 142.  Her initial weight in the office was 174.  Today, she is down to 166.  She is admitted now for rehydration, further evaluation, and consultation with both MFM and GI medicine to improve her symptoms, nutritional status, etc.  ALLERGIES:  SHE IS ALLERGIC TO SULFA.  PAST MEDICAL HISTORY:  She has had 3 vaginal deliveries, all female, at term.  She is a carrier for polycystic kidney mutation.  She also has an Arnold-Chiari malformation with a history of migraine headaches.  Also in the past, she has had laparoscopy with ablation of endometriosis.  FAMILY HISTORY:  For the remainder of her family history, please see the [facility] form for details.  PHYSICAL EXAMINATION:  VITAL SIGNS:  Temperature 98.2, blood pressure 110/60. HEENT:  Unremarkable. NECK:  Supple without masses. LUNGS:  Clear. CARDIOVASCULAR:  Regular rate and rhythm.  No murmurs, rubs, or gallops noted. BREASTS:  Not examined. ABDOMEN:  Unremarkable.  Soft.  Normal bowel sounds.  Fetal heart rate 142.  Cervix was not examined. EXTREMITIES:  Unremarkable. NEUROLOGIC:  Unremarkable.  IMPRESSION:  A 13-week pregnancy with continued nausea, vomiting, diarrhea, and weight loss.  We will admit for hydration and  further evaluation and consultation with MFM and GI.     Keliyah Spillman M. Matthew Saras, M.D.    [facility]/MEDQ  D:  04/21/2017  T:  04/21/2017  Job:  [id number]

## 2017-04-21 NOTE — Consult Note (Signed)
Maternal Fetal Medicine Consultation  Requesting Provider(s):[place]  Primary SJ:[id number] Reason for consultation: Persistent nausea vomiting and diarrhea with weight loss  HPI: 41yo P3003 at 13+0 weeks with a 6 week history of intermittent but frequent diarrhea, now with vomiting. This began in late January. She last took an antibiotic in early December. No recent travel No other members of the household have had any similar GI disturbance. The diarrhea has accelerated over the past week but is in abeyance now since she began taking imodium OTC. She has cramping and abdominal pain with her diarrhea and her vomiting. Amylase, lipase, LFTs, CBC are normal. Electrolytes are normal except for slightly decreased CO2.   OB History: OB History    Gravida Para Term Preterm AB Living   4 3 3     3    SAB TAB Ectopic Multiple Live Births         0 3      PMH:  Past Medical History:  Diagnosis Date  . Anxiety   . Chiari malformation type I ([place])    MRI [facility] 2013  . Depression    PPD with 2nd baby  . Endometriosis 1997  . Headache   . Headache in pregnancy   . Hearing loss   . Kidney infection   . Kidney stones   . Otosclerosis 2008  . Otosclerosis     PSH:  Past Surgical History:  Procedure Laterality Date  . DILATION AND CURETTAGE OF UTERUS  May 2015  . LAPAROSCOPIC ABDOMINAL EXPLORATION  06/2013   has had 4 surgeries  . SHOULDER SURGERY  2000  . TONSILLECTOMY AND ADENOIDECTOMY  2010  . WISDOM TOOTH EXTRACTION  2008   Meds:IVF Allergies: Shellfish, Verapamil, surgical glue, sulfa FH: See EPIC Soc: See EPIC  Review of Systems: no vaginal bleeding or cramping/contractions, no LOF, no nausea/vomiting. All other systems reviewed and are negative.  PE:  VS: See EPIC GEN: well-appearing female ABD: No CVAT. No HSM or masses. Negative Murphy's. Uterus nontender. Mo rebound or rigidity. Slight LLQ tenderness to deep palpation  A/P: This is almost certainly a primary  gastroenterological disorder. There is no significant relationship to her pregnancy. I have ordered a gallbladder US, clostridium difficile toxin and blood/ova/parasites in the stool, but a gastroenterology consultation would be indicated at this point.  Thank you for the opportunity to be a part of the care of [employer]. Please contact our office if we can be of further assistance.   I spent approximately 30 minutes with this patient with over 50% of time spent in face-to-face counseling.

## 2017-04-21 NOTE — Progress Notes (Signed)
MFM note reviewed, GI consult already requested through my office/THX

## 2017-04-22 DIAGNOSIS — O211 Hyperemesis gravidarum with metabolic disturbance: Secondary | ICD-10-CM | POA: Diagnosis not present

## 2017-04-22 DIAGNOSIS — O26899 Other specified pregnancy related conditions, unspecified trimester: Secondary | ICD-10-CM | POA: Diagnosis not present

## 2017-04-22 DIAGNOSIS — R197 Diarrhea, unspecified: Secondary | ICD-10-CM | POA: Diagnosis not present

## 2017-04-22 LAB — GASTROINTESTINAL PANEL BY PCR, STOOL (REPLACES STOOL CULTURE)
ADENOVIRUS F40/41: NOT DETECTED
Astrovirus: NOT DETECTED
Campylobacter species: NOT DETECTED
Cryptosporidium: NOT DETECTED
Cyclospora cayetanensis: NOT DETECTED
ENTEROAGGREGATIVE E COLI (EAEC): NOT DETECTED
ENTEROPATHOGENIC E COLI (EPEC): DETECTED — AB
ENTEROTOXIGENIC E COLI (ETEC): NOT DETECTED
Entamoeba histolytica: NOT DETECTED
GIARDIA LAMBLIA: NOT DETECTED
NOROVIRUS GI/GII: NOT DETECTED
Plesimonas shigelloides: NOT DETECTED
ROTAVIRUS A: NOT DETECTED
SHIGELLA/ENTEROINVASIVE E COLI (EIEC): NOT DETECTED
Salmonella species: NOT DETECTED
Sapovirus (I, II, IV, and V): NOT DETECTED
Shiga like toxin producing E coli (STEC): NOT DETECTED
Vibrio cholerae: NOT DETECTED
Vibrio species: NOT DETECTED
Yersinia enterocolitica: NOT DETECTED

## 2017-04-22 LAB — C DIFFICILE QUICK SCREEN W PCR REFLEX
C DIFFICILE (CDIFF) INTERP: NOT DETECTED
C Diff antigen: NEGATIVE
C Diff toxin: NEGATIVE

## 2017-04-22 LAB — OCCULT BLOOD X 1 CARD TO LAB, STOOL: FECAL OCCULT BLD: NEGATIVE

## 2017-04-22 MED ORDER — AZITHROMYCIN 250 MG PO TABS
500.0000 mg | ORAL_TABLET | Freq: Every day | ORAL | Status: AC
  Administered 2017-04-22 – 2017-04-24 (×3): 500 mg via ORAL
  Filled 2017-04-22 (×3): qty 2

## 2017-04-22 NOTE — Progress Notes (Signed)
Pt had several loose stools this shift 5 x . Stool sample collected and sent to lab.

## 2017-04-22 NOTE — Progress Notes (Addendum)
Pt + stool culture for E. Coli  Will start azithromycin 500mg  po qd x 3days Continue IVF and advance diet Admit as inpatient and consider discharge once sx improve

## 2017-04-22 NOTE — Progress Notes (Signed)
Pt continues to experience explosive watery diarrhea - 12-13x over last shift.  No pain/cramping currently but did experience abdominal pain and cramping overnight.  No n/v.  No VB.  FHT + (143) AF, VSS Gen - NAD Abd - ND/NT Ext - NT, no edema PV - deferred  Results for orders placed or performed during the hospital encounter of 04/21/17 (from the past 24 hour(s))  Type and screen [place]     Status: None   Collection Time: 04/21/17 11:52 AM  Result Value Ref Range   ABO/RH(D) B POS    Antibody Screen NEG    Sample Expiration      04/24/2017 Performed at [facility], [address], [place], [place] [address]   CBC     Status: None   Collection Time: 04/21/17 11:52 AM  Result Value Ref Range   WBC 7.1 4.0 - 10.5 K/uL   RBC 4.32 3.87 - 5.11 MIL/uL   Hemoglobin 13.3 12.0 - 15.0 g/dL   HCT 37.4 36.0 - 46.0 %   MCV 86.6 78.0 - 100.0 fL   MCH 30.8 26.0 - 34.0 pg   MCHC 35.6 30.0 - 36.0 g/dL   RDW 12.5 11.5 - 15.5 %   Platelets 199 150 - 400 K/uL  Comprehensive metabolic panel     Status: Abnormal   Collection Time: 04/21/17 11:52 AM  Result Value Ref Range   Sodium 135 135 - 145 mmol/L   Potassium 3.8 3.5 - 5.1 mmol/L   Chloride 107 101 - 111 mmol/L   CO2 20 (L) 22 - 32 mmol/L   Glucose, Bld 74 65 - 99 mg/dL   BUN 8 6 - 20 mg/dL   Creatinine, Ser 0.58 0.44 - 1.00 mg/dL   Calcium 8.9 8.9 - 10.3 mg/dL   Total Protein 6.8 6.5 - 8.1 g/dL   Albumin 4.0 3.5 - 5.0 g/dL   AST 20 15 - 41 U/L   ALT 14 14 - 54 U/L   Alkaline Phosphatase 52 38 - 126 U/L   Total Bilirubin 0.7 0.3 - 1.2 mg/dL   GFR calc non Af Amer >60 >60 mL/min   GFR calc Af Amer >60 >60 mL/min   Anion gap 8 5 - 15  Amylase     Status: None   Collection Time: 04/21/17 11:52 AM  Result Value Ref Range   Amylase 81 28 - 100 U/L  Lipase, blood     Status: None   Collection Time: 04/21/17 11:52 AM  Result Value Ref Range   Lipase 32 11 - 51 U/L  C difficile quick scan w PCR reflex      Status: None   Collection Time: 04/21/17  8:50 PM  Result Value Ref Range   C Diff antigen NEGATIVE NEGATIVE   C Diff toxin NEGATIVE NEGATIVE   C Diff interpretation No C. difficile detected.    A/P:  13+3 weeks with chronic diarrhea GI following - appreciate recs O&P pending.  C.diff negative - hold immodium until final infectious w/u neg Continue supportive measures w/ IVF IF infectious w/u neg - likely need further GI eval for etiology

## 2017-04-22 NOTE — Progress Notes (Signed)
    Patient's stool pathogen panel is positive for enteropathogenic e.coli. Typically self-limited infection but has persisted in her. Will defer treatment to OB/GYN. A couple of options include Azithromycin or a fluroquinolone. Thank you. Patient can follow up with [place] outpatient if needed.

## 2017-04-23 DIAGNOSIS — R110 Nausea: Secondary | ICD-10-CM | POA: Diagnosis not present

## 2017-04-23 DIAGNOSIS — O26891 Other specified pregnancy related conditions, first trimester: Secondary | ICD-10-CM | POA: Diagnosis not present

## 2017-04-23 DIAGNOSIS — Z882 Allergy status to sulfonamides status: Secondary | ICD-10-CM | POA: Diagnosis not present

## 2017-04-23 DIAGNOSIS — O210 Mild hyperemesis gravidarum: Secondary | ICD-10-CM | POA: Diagnosis not present

## 2017-04-23 DIAGNOSIS — O219 Vomiting of pregnancy, unspecified: Secondary | ICD-10-CM | POA: Diagnosis not present

## 2017-04-23 DIAGNOSIS — A498 Other bacterial infections of unspecified site: Secondary | ICD-10-CM | POA: Diagnosis present

## 2017-04-23 DIAGNOSIS — R634 Abnormal weight loss: Secondary | ICD-10-CM | POA: Diagnosis present

## 2017-04-23 DIAGNOSIS — O9989 Other specified diseases and conditions complicating pregnancy, childbirth and the puerperium: Secondary | ICD-10-CM | POA: Diagnosis present

## 2017-04-23 DIAGNOSIS — A040 Enteropathogenic Escherichia coli infection: Secondary | ICD-10-CM | POA: Diagnosis not present

## 2017-04-23 DIAGNOSIS — R109 Unspecified abdominal pain: Secondary | ICD-10-CM | POA: Diagnosis not present

## 2017-04-23 DIAGNOSIS — O26899 Other specified pregnancy related conditions, unspecified trimester: Secondary | ICD-10-CM | POA: Diagnosis not present

## 2017-04-23 DIAGNOSIS — Z3A13 13 weeks gestation of pregnancy: Secondary | ICD-10-CM | POA: Diagnosis not present

## 2017-04-23 DIAGNOSIS — B9620 Unspecified Escherichia coli [E. coli] as the cause of diseases classified elsewhere: Secondary | ICD-10-CM | POA: Diagnosis present

## 2017-04-23 DIAGNOSIS — R197 Diarrhea, unspecified: Secondary | ICD-10-CM | POA: Diagnosis not present

## 2017-04-23 DIAGNOSIS — Z3A14 14 weeks gestation of pregnancy: Secondary | ICD-10-CM | POA: Diagnosis not present

## 2017-04-23 DIAGNOSIS — R198 Other specified symptoms and signs involving the digestive system and abdomen: Secondary | ICD-10-CM | POA: Diagnosis not present

## 2017-04-23 DIAGNOSIS — O211 Hyperemesis gravidarum with metabolic disturbance: Secondary | ICD-10-CM | POA: Diagnosis not present

## 2017-04-23 DIAGNOSIS — A09 Infectious gastroenteritis and colitis, unspecified: Secondary | ICD-10-CM | POA: Diagnosis not present

## 2017-04-23 DIAGNOSIS — Z87891 Personal history of nicotine dependence: Secondary | ICD-10-CM | POA: Diagnosis not present

## 2017-04-23 LAB — CBC
HCT: 34.5 % — ABNORMAL LOW (ref 36.0–46.0)
HEMOGLOBIN: 12.3 g/dL (ref 12.0–15.0)
MCH: 30.6 pg (ref 26.0–34.0)
MCHC: 35.7 g/dL (ref 30.0–36.0)
MCV: 85.8 fL (ref 78.0–100.0)
PLATELETS: 194 10*3/uL (ref 150–400)
RBC: 4.02 MIL/uL (ref 3.87–5.11)
RDW: 12.4 % (ref 11.5–15.5)
WBC: 7.8 10*3/uL (ref 4.0–10.5)

## 2017-04-23 LAB — COMPREHENSIVE METABOLIC PANEL
ALT: 16 U/L (ref 14–54)
ANION GAP: 5 (ref 5–15)
AST: 19 U/L (ref 15–41)
Albumin: 3.5 g/dL (ref 3.5–5.0)
Alkaline Phosphatase: 52 U/L (ref 38–126)
BUN: <5 mg/dL — ABNORMAL LOW (ref 6–20)
CHLORIDE: 109 mmol/L (ref 101–111)
CO2: 20 mmol/L — AB (ref 22–32)
CREATININE: 0.5 mg/dL (ref 0.44–1.00)
Calcium: 8.5 mg/dL — ABNORMAL LOW (ref 8.9–10.3)
Glucose, Bld: 100 mg/dL — ABNORMAL HIGH (ref 65–99)
POTASSIUM: 3.3 mmol/L — AB (ref 3.5–5.1)
Sodium: 134 mmol/L — ABNORMAL LOW (ref 135–145)
Total Bilirubin: 0.6 mg/dL (ref 0.3–1.2)
Total Protein: 6.2 g/dL — ABNORMAL LOW (ref 6.5–8.1)

## 2017-04-23 MED ORDER — PROMETHAZINE HCL 25 MG/ML IJ SOLN
12.5000 mg | Freq: Four times a day (QID) | INTRAMUSCULAR | Status: DC | PRN
  Administered 2017-04-23: 12.5 mg via INTRAVENOUS
  Filled 2017-04-23: qty 1

## 2017-04-23 MED ORDER — SCOPOLAMINE 1 MG/3DAYS TD PT72
1.0000 | MEDICATED_PATCH | TRANSDERMAL | Status: DC
  Administered 2017-04-23: 1.5 mg via TRANSDERMAL
  Filled 2017-04-23 (×2): qty 1

## 2017-04-23 NOTE — Progress Notes (Signed)
Pt con't to feel dizziness even at rest.  States it is worse than when she was assessed this am.  She states she did notify MD when she came in for rounds.  Pt states she is able to ambulate slowly, instructed to call for stand by assist.  Pt verb understanding.

## 2017-04-23 NOTE — Progress Notes (Signed)
Pt just had first diarrhea since 9pm last night.  Much improved.  No n/v.  No VB.  +FHT AF, VSS Gen - NAD Abd - ND/NT Ext - NT, no edema PV - deferred  A/P:  13+4 weeks with enteropathogenic E Coli Continue supportive measures w/ IVF Monitor until after lunch and if continued improvement, D/C home this pm  Linda Hedges, DO

## 2017-04-23 NOTE — Progress Notes (Signed)
Pt instructed to use bedside commode.

## 2017-04-23 NOTE — Progress Notes (Signed)
Admission nutrition screen triggered for unintentional weight loss > 10 lbs within the last month. Patients chart reviewed and assessed  for nutritional risk. Per PNR pt has experienced a 5 lb weight loss over the past month. Etiology of weight loss : severe diarrhea, positive for enteropathogenic e.coli.  Pt currently undergoing medical mgt for same.   Boost Breeze or [employer] are supplements that could be added in addition  to meals if needed  [employer].Fredderick Severance LDN Neonatal Nutrition Support Specialist/RD III Pager [phone]      Phone [phone]

## 2017-04-23 NOTE — Progress Notes (Addendum)
Patient increasingly c/o dizziness; worsening.  She reports this has been on ongoing issue for her this pregnancy but today, it felt like her bed literally flipped upside down.  She is holding onto the side of the bed.  No BM since the one she had this am. Will check CBC, CMET Consider D/C tomorrow Scopolamine patch  Linda Hedges, DO

## 2017-04-24 MED ORDER — SCOPOLAMINE 1 MG/3DAYS TD PT72: 1.0000 | MEDICATED_PATCH | TRANSDERMAL | 0 refills | Status: DC

## 2017-04-24 NOTE — Discharge Instructions (Signed)
Call MD for worsening diarrhea.  Follow up at office visit on Thursday.

## 2017-04-24 NOTE — Discharge Summary (Signed)
Obstetric Discharge Summary Reason for Admission: Severe diarrhea, nausea Prenatal Procedures: Consult to MFM and GI Intrapartum Procedures: n/a Postpartum Procedures: n/a Complications-Operative and Postpartum: n/a Hemoglobin  Date Value Ref Range Status  04/23/2017 12.3 12.0 - 15.0 g/dL Final   HCT  Date Value Ref Range Status  04/23/2017 34.5 (L) 36.0 - 46.0 % Final    Physical Exam:  General: alert, cooperative and appears stated age 42: n/a Uterine Fundus: soft Incision: n/a DVT Evaluation: No evidence of DVT seen on physical exam. Negative Homan's sign. No cords or calf tenderness.  Discharge Diagnoses: Enteropathogenic E. Coli  Discharge Information: Date: 04/24/2017 Activity: unrestricted Diet: routine Medications: PNV and scopolamine, sertraline Condition: improved Instructions: refer to practice specific booklet Discharge to: home   Newborn Data: This patient has no babies on file. Home with n/a.  Atlee Kluth, [place] 04/24/2017, 9:54 AM

## 2017-04-24 NOTE — Plan of Care (Signed)
Patient is resting in bed at this time. Patient denies any pain or discomfort. Patient also denies any nausea or vomiting. Patient states, "I feel much better than I did yesterday."

## 2017-04-24 NOTE — Progress Notes (Signed)
All discharge teaching completed with the patient. All printed discharge instructions given and explained to the patient. Patient verbalizes an understanding of all instructions given. No questions or concerns voiced at this time.

## 2017-04-24 NOTE — Progress Notes (Signed)
Pt has had two episodes of diarrhea in 24 hours.  Much improved dizziness with scopolamine patch. No n/v. No VB.  +FHT AF, VSS Gen - NAD Abd - ND/NT Ext - NT, no edema PV - deferred  A/P: 13+5 weeks with enteropathogenic E Coli D/C home with continued Azith and scop F/U in office next Thurs.  Linda Hedges, DO

## 2017-04-26 ENCOUNTER — Other Ambulatory Visit: Payer: Self-pay

## 2017-04-26 ENCOUNTER — Encounter (HOSPITAL_COMMUNITY): Payer: Self-pay

## 2017-04-26 ENCOUNTER — Inpatient Hospital Stay (EMERGENCY_DEPARTMENT_HOSPITAL)
Admission: AD | Admit: 2017-04-26 | Discharge: 2017-04-30 | Disposition: A | Discharge status: Home / Self Care | Payer: BLUE CROSS/BLUE SHIELD | Source: Ambulatory Visit | Attending: Obstetrics & Gynecology | Admitting: Obstetrics & Gynecology

## 2017-04-26 DIAGNOSIS — O26899 Other specified pregnancy related conditions, unspecified trimester: Secondary | ICD-10-CM

## 2017-04-26 DIAGNOSIS — R197 Diarrhea, unspecified: Secondary | ICD-10-CM | POA: Diagnosis not present

## 2017-04-26 DIAGNOSIS — O219 Vomiting of pregnancy, unspecified: Secondary | ICD-10-CM

## 2017-04-26 DIAGNOSIS — Z882 Allergy status to sulfonamides status: Secondary | ICD-10-CM

## 2017-04-26 DIAGNOSIS — B9629 Other Escherichia coli [E. coli] as the cause of diseases classified elsewhere: Secondary | ICD-10-CM

## 2017-04-26 DIAGNOSIS — R634 Abnormal weight loss: Secondary | ICD-10-CM | POA: Diagnosis present

## 2017-04-26 DIAGNOSIS — Z87891 Personal history of nicotine dependence: Secondary | ICD-10-CM

## 2017-04-26 DIAGNOSIS — R1110 Vomiting, unspecified: Secondary | ICD-10-CM

## 2017-04-26 DIAGNOSIS — O9989 Other specified diseases and conditions complicating pregnancy, childbirth and the puerperium: Secondary | ICD-10-CM | POA: Diagnosis present

## 2017-04-26 DIAGNOSIS — K529 Noninfective gastroenteritis and colitis, unspecified: Secondary | ICD-10-CM | POA: Diagnosis present

## 2017-04-26 DIAGNOSIS — O210 Mild hyperemesis gravidarum: Secondary | ICD-10-CM | POA: Diagnosis present

## 2017-04-26 DIAGNOSIS — Z3A14 14 weeks gestation of pregnancy: Secondary | ICD-10-CM

## 2017-04-26 DIAGNOSIS — O26891 Other specified pregnancy related conditions, first trimester: Secondary | ICD-10-CM | POA: Diagnosis present

## 2017-04-26 DIAGNOSIS — O99611 Diseases of the digestive system complicating pregnancy, first trimester: Principal | ICD-10-CM

## 2017-04-26 DIAGNOSIS — R198 Other specified symptoms and signs involving the digestive system and abdomen: Secondary | ICD-10-CM

## 2017-04-26 LAB — URINALYSIS, ROUTINE W REFLEX MICROSCOPIC
Bilirubin Urine: NEGATIVE
GLUCOSE, UA: NEGATIVE mg/dL
Hgb urine dipstick: NEGATIVE
KETONES UR: NEGATIVE mg/dL
LEUKOCYTES UA: NEGATIVE
Nitrite: NEGATIVE
PH: 6 (ref 5.0–8.0)
Protein, ur: NEGATIVE mg/dL
Specific Gravity, Urine: 1.008 (ref 1.005–1.030)

## 2017-04-26 LAB — TYPE AND SCREEN
ABO/RH(D): B POS
ANTIBODY SCREEN: NEGATIVE

## 2017-04-26 LAB — COMPREHENSIVE METABOLIC PANEL
ALK PHOS: 51 U/L (ref 38–126)
ALT: 14 U/L (ref 14–54)
ANION GAP: 6 (ref 5–15)
AST: 14 U/L — ABNORMAL LOW (ref 15–41)
Albumin: 3.5 g/dL (ref 3.5–5.0)
BILIRUBIN TOTAL: 0.4 mg/dL (ref 0.3–1.2)
BUN: 7 mg/dL (ref 6–20)
CALCIUM: 8.5 mg/dL — AB (ref 8.9–10.3)
CO2: 20 mmol/L — AB (ref 22–32)
Chloride: 108 mmol/L (ref 101–111)
Creatinine, Ser: 0.62 mg/dL (ref 0.44–1.00)
GFR calc non Af Amer: >60 mL/min (ref >60)
Glucose, Bld: 85 mg/dL (ref 65–99)
Potassium: 3.5 mmol/L (ref 3.5–5.1)
SODIUM: 134 mmol/L — AB (ref 135–145)
TOTAL PROTEIN: 6.1 g/dL — AB (ref 6.5–8.1)

## 2017-04-26 LAB — CBC
HCT: 34.8 % — ABNORMAL LOW (ref 36.0–46.0)
HEMOGLOBIN: 12.3 g/dL (ref 12.0–15.0)
MCH: 30.4 pg (ref 26.0–34.0)
MCHC: 35.3 g/dL (ref 30.0–36.0)
MCV: 86.1 fL (ref 78.0–100.0)
Platelets: 197 10*3/uL (ref 150–400)
RBC: 4.04 MIL/uL (ref 3.87–5.11)
RDW: 12.4 % (ref 11.5–15.5)
WBC: 8 10*3/uL (ref 4.0–10.5)

## 2017-04-26 LAB — OVA + PARASITE EXAM

## 2017-04-26 LAB — LIPASE, BLOOD: Lipase: 26 U/L (ref 11–51)

## 2017-04-26 LAB — O&P RESULT

## 2017-04-26 MED ORDER — DICYCLOMINE HCL 10 MG PO CAPS
10.0000 mg | ORAL_CAPSULE | Freq: Three times a day (TID) | ORAL | Status: DC
  Administered 2017-04-27 (×2): 10 mg via ORAL
  Filled 2017-04-26 (×5): qty 1

## 2017-04-26 MED ORDER — SCOPOLAMINE 1 MG/3DAYS TD PT72
1.0000 | MEDICATED_PATCH | TRANSDERMAL | Status: DC
  Administered 2017-04-27: 1.5 mg via TRANSDERMAL
  Filled 2017-04-26 (×2): qty 1

## 2017-04-26 MED ORDER — AZITHROMYCIN 250 MG PO TABS
500.0000 mg | ORAL_TABLET | Freq: Every day | ORAL | Status: DC
  Administered 2017-04-26 – 2017-04-27 (×2): 500 mg via ORAL
  Filled 2017-04-26 (×3): qty 2

## 2017-04-26 MED ORDER — PRENATAL MULTIVITAMIN CH
1.0000 | ORAL_TABLET | Freq: Every day | ORAL | Status: DC
  Administered 2017-04-27 – 2017-04-30 (×3): 1 via ORAL
  Filled 2017-04-26 (×6): qty 1

## 2017-04-26 MED ORDER — ONDANSETRON 8 MG PO TBDP
8.0000 mg | ORAL_TABLET | Freq: Once | ORAL | Status: AC
  Administered 2017-04-26: 8 mg via ORAL
  Filled 2017-04-26: qty 1

## 2017-04-26 MED ORDER — DOCUSATE SODIUM 100 MG PO CAPS
100.0000 mg | ORAL_CAPSULE | Freq: Every day | ORAL | Status: DC
  Filled 2017-04-26: qty 1

## 2017-04-26 MED ORDER — LACTATED RINGERS IV SOLN
INTRAVENOUS | Status: DC
  Administered 2017-04-26 – 2017-04-30 (×12): via INTRAVENOUS

## 2017-04-26 MED ORDER — ONDANSETRON HCL 4 MG/2ML IJ SOLN
4.0000 mg | Freq: Four times a day (QID) | INTRAMUSCULAR | Status: DC | PRN
  Administered 2017-04-26 – 2017-04-27 (×3): 4 mg via INTRAVENOUS
  Filled 2017-04-26 (×3): qty 2

## 2017-04-26 MED ORDER — SERTRALINE HCL 100 MG PO TABS
100.0000 mg | ORAL_TABLET | Freq: Every day | ORAL | Status: DC
  Administered 2017-04-26 – 2017-04-30 (×5): 100 mg via ORAL
  Filled 2017-04-26 (×6): qty 1

## 2017-04-26 MED ORDER — ACETAMINOPHEN 325 MG PO TABS
650.0000 mg | ORAL_TABLET | ORAL | Status: DC | PRN
  Administered 2017-04-29: 650 mg via ORAL
  Filled 2017-04-26: qty 2

## 2017-04-26 MED ORDER — ZOLPIDEM TARTRATE 5 MG PO TABS
5.0000 mg | ORAL_TABLET | Freq: Every evening | ORAL | Status: DC | PRN
  Administered 2017-04-27 (×2): 5 mg via ORAL
  Filled 2017-04-26 (×3): qty 1

## 2017-04-26 MED ORDER — CALCIUM CARBONATE ANTACID 500 MG PO CHEW
2.0000 | CHEWABLE_TABLET | ORAL | Status: DC | PRN
  Administered 2017-04-29: 400 mg via ORAL
  Filled 2017-04-26: qty 2

## 2017-04-26 MED ORDER — FAMOTIDINE IN NACL 20-0.9 MG/50ML-% IV SOLN
20.0000 mg | Freq: Once | INTRAVENOUS | Status: AC
  Administered 2017-04-26: 20 mg via INTRAVENOUS
  Filled 2017-04-26: qty 50

## 2017-04-26 MED ORDER — LACTATED RINGERS IV BOLUS (SEPSIS)
1000.0000 mL | Freq: Once | INTRAVENOUS | Status: AC
  Administered 2017-04-26: 1000 mL via INTRAVENOUS

## 2017-04-26 MED ORDER — DICYCLOMINE HCL 10 MG PO CAPS: 10.0000 mg | ORAL_CAPSULE | Freq: Three times a day (TID) | ORAL | Status: DC

## 2017-04-26 MED ORDER — PROMETHAZINE HCL 25 MG/ML IJ SOLN
12.5000 mg | Freq: Once | INTRAMUSCULAR | Status: AC
  Administered 2017-04-26: 12.5 mg via INTRAVENOUS
  Filled 2017-04-26: qty 1

## 2017-04-26 MED ORDER — GI COCKTAIL ~~LOC~~
30.0000 mL | Freq: Once | ORAL | Status: AC
  Administered 2017-04-26: 30 mL via ORAL
  Filled 2017-04-26: qty 30

## 2017-04-26 NOTE — Progress Notes (Signed)
Pt still unable to give stool sample. Pt states her last stool was last evening around 1900. Bentyl not given per order to start after sample received. Toya Smothers, RN

## 2017-04-26 NOTE — MAU Provider Note (Signed)
History     CSN: [id number]  Arrival date and time: 04/26/17 0230   First Provider Initiated Contact with Patient 04/26/17 0304      Chief Complaint  Patient presents with  . Abdominal Pain  . Nausea  . Diarrhea   HPI  Ellen Vasquez is a 42 y.o. G4P3003 at [redacted]w[redacted]d who presents with n/v/d and abdominal pain. Was admitted for same last week; discharged on 3/9 after receiving antibiotics for e. Coli & GI consult. Patient reports initially some improvement at time of discharge but symptoms returned Sunday. Reports 9 episode of watery stools in the last 24 hours. No blood in stool. Some lower abdominal cramping immediately prior to episode of BM. Continued nausea; but has not vomited today and was able to keep down grits & toast this evening. Endorses constant epigastric pain that started this evening. Rates pain 8/10 & describes as constant aching. Has not taken any medication for her symptoms. Denies fever/chills, hematuria, or vaginal bleeding.   OB History    Gravida Para Term Preterm AB Living   4 3 3     3    SAB TAB Ectopic Multiple Live Births         0 3      Past Medical History:  Diagnosis Date  . Anxiety   . Chiari malformation type I ([place])    MRI [facility] 2013  . Depression    PPD with 2nd baby  . Endometriosis 1997  . Headache   . Headache in pregnancy   . Hearing loss   . Kidney infection   . Kidney stones   . Otosclerosis 2008  . Otosclerosis     Past Surgical History:  Procedure Laterality Date  . DILATION AND CURETTAGE OF UTERUS  May 2015  . LAPAROSCOPIC ABDOMINAL EXPLORATION  06/2013   has had 4 surgeries  . SHOULDER SURGERY  2000  . TONSILLECTOMY AND ADENOIDECTOMY  2010  . WISDOM TOOTH EXTRACTION  2008    Family History  Problem Relation Age of Onset  . Asthma Father   . Arthritis Father        Rheumatoid  . Cancer Maternal Grandmother        skin  . Hypertension Other   . Hyperlipidemia Other   . Stroke Other   . Obesity Other   .  Aneurysm Paternal Grandfather        brain  . Skin cancer Paternal Grandmother   . Migraines Neg Hx     Social History   Tobacco Use  . Smoking status: Former Smoker    Types: Cigarettes    Last attempt to quit: 11/25/2001    Years since quitting: 15.4  . Smokeless tobacco: Never Used  Substance Use Topics  . Alcohol use: Yes    Alcohol/week: 0.6 oz    Types: 1 Glasses of wine per week    Comment: social events  . Drug use: No    Allergies:  Allergies  Allergen Reactions  . Shellfish Allergy Hives  . Verapamil     GI sx, weakness  . Other     Surgical glue causes a rash  . Sulfa Antibiotics Dermatitis and Rash    Medications Prior to Admission  Medication Sig Dispense Refill Last Dose  . Prenat w/o [id number] (CITRANATAL ASSURE PO) Take 1 tablet by mouth daily.   04/25/2017 at Unknown time  . scopolamine (TRANSDERM-SCOP) 1 MG/3DAYS Place 1 patch (1.5 mg total) onto the skin every 3 (three) days.  10 patch 0 Past Week at Unknown time  . sertraline (ZOLOFT) 100 MG tablet Take 100 mg by mouth daily.   04/25/2017 at Unknown time  . acetaminophen (TYLENOL) 500 MG tablet Take 1,000 mg by mouth every 6 (six) hours as needed for headache.   04/15/2017  . butalbital-acetaminophen-caffeine (FIORICET, ESGIC) 50-325-40 MG tablet Take 1-2 tablets by mouth every 6 (six) hours as needed for headache. (Patient not taking: Reported on 04/21/2017) 30 tablet 0 Not Taking at Unknown time  . calcium carbonate (TUMS - DOSED IN MG ELEMENTAL CALCIUM) 500 MG chewable tablet Chew 2 tablets by mouth daily as needed for indigestion or heartburn.   04/17/2017    Review of Systems  Constitutional: Negative for chills and fever.  Gastrointestinal: Positive for abdominal pain, diarrhea and nausea. Negative for abdominal distention, anal bleeding, blood in stool and vomiting.  Genitourinary: Negative.    Physical Exam   Blood pressure 119/62, pulse 79, temperature 98.6 F (37 C), resp. rate 19,  height 5\' 9"  (1.753 m), weight 164 lb 7 oz (74.6 kg), last menstrual period 09/05/2016, SpO2 100 %, unknown if currently breastfeeding.  Physical Exam  Nursing note and vitals reviewed. Constitutional: She is oriented to person, place, and time. She appears well-developed and well-nourished. No distress.  HENT:  Head: Normocephalic and atraumatic.  Eyes: Conjunctivae are normal. Right eye exhibits no discharge. Left eye exhibits no discharge. No scleral icterus.  Neck: Normal range of motion.  Cardiovascular: Normal rate, regular rhythm and normal heart sounds.  No murmur heard. Respiratory: Effort normal and breath sounds normal. No respiratory distress. She has no wheezes.  GI: Soft. Bowel sounds are normal. There is tenderness in the epigastric area. There is no CVA tenderness and negative Murphy's sign.  Neurological: She is alert and oriented to person, place, and time.  Skin: Skin is warm and dry. She is not diaphoretic.  Psychiatric: She has a normal mood and affect. Her behavior is normal. Judgment and thought content normal.    MAU Course  Procedures Results for orders placed or performed during the hospital encounter of 04/26/17 (from the past 24 hour(s))  Urinalysis, Routine w reflex microscopic     Status: None   Collection Time: 04/26/17  2:00 AM  Result Value Ref Range   Color, Urine YELLOW YELLOW   APPearance CLEAR CLEAR   Specific Gravity, Urine 1.008 1.005 - 1.030   pH 6.0 5.0 - 8.0   Glucose, UA NEGATIVE NEGATIVE mg/dL   Hgb urine dipstick NEGATIVE NEGATIVE   Bilirubin Urine NEGATIVE NEGATIVE   Ketones, ur NEGATIVE NEGATIVE mg/dL   Protein, ur NEGATIVE NEGATIVE mg/dL   Nitrite NEGATIVE NEGATIVE   Leukocytes, UA NEGATIVE NEGATIVE    MDM FHT 146 Discussed plan with patient for IV fluids, medications, and labs per c/w Dr. Lynnette Caffey. Patient & spouse initially agreeable to plan. I was then called to room to discuss plan with them again regarding whether she would be  admitted. Per spouse, patient's symptoms are the same as last week when she was admitted. Requesting to speak with Dr. Lynnette Caffey regarding plan of care.  Dr. Lynnette Caffey spoke with patient & spouse via phone.   Assessment and Plan  A: 1. Diarrhea during pregnancy   2. [redacted] weeks gestation of pregnancy   3. Hyperemesis gravidarum, antepartum    P: Orders placed for admission   Jorje Guild 04/26/2017, 3:04 AM

## 2017-04-26 NOTE — H&P (Addendum)
Ellen Vasquez is a 42 y.o. female G4P3003 at 14 weeks presenting for readmission for gastrointestinal complaints. Ellen Vasquez has had diarrhea, nausea and vomiting for the past 6 weeks but had an exacerbation mid-week last week.  She was admitted 3/6 secondary to weight loss of 8 lbs this pregnancy.  RUQ u/s wnl. She had MFM consult and GI consult.  Diagnosis of Enteropathogenic E. Coli was made.  She was treated with azithromycin and felt significantly better by Saturday.  She was discharged home but began having frequent loose stools again (9 since discharge; "very watery").  She reports dry heaving; no emesis.  She has new complaint of epigastric pain (not improved by GI cocktail) and worsening stool odor. She has continued to have no fever.    OB History    Gravida Para Term Preterm AB Living   4 3 3     3    SAB TAB Ectopic Multiple Live Births         0 3     Past Medical History:  Diagnosis Date  . Anxiety   . Chiari malformation type I ([place])    MRI [facility] 2013  . Depression    PPD with 2nd baby  . Endometriosis 1997  . Headache   . Headache in pregnancy   . Hearing loss   . Kidney infection   . Kidney stones   . Otosclerosis 2008  . Otosclerosis    Past Surgical History:  Procedure Laterality Date  . DILATION AND CURETTAGE OF UTERUS  May 2015  . LAPAROSCOPIC ABDOMINAL EXPLORATION  06/2013   has had 4 surgeries  . SHOULDER SURGERY  2000  . TONSILLECTOMY AND ADENOIDECTOMY  2010  . WISDOM TOOTH EXTRACTION  2008   Family History: family history includes Aneurysm in her paternal grandfather; Arthritis in her father; Asthma in her father; Cancer in her maternal grandmother; Hyperlipidemia in her other; Hypertension in her other; Obesity in her other; Skin cancer in her paternal grandmother; Stroke in her other. Social History:  reports that she quit smoking about 15 years ago. Her smoking use included cigarettes. she has never used smokeless tobacco. She reports that she drinks  about 0.6 oz of alcohol per week. She reports that she does not use drugs.     Maternal Diabetes: No Genetic Screening: n/a Maternal Ultrasounds/Referrals: Normal Fetal Ultrasounds or other Referrals:  None Maternal Substance Abuse:  No Significant Maternal Medications:  Meds include: Zoloft Significant Maternal Lab Results:  None Other Comments:  None  ROS Maternal Medical History:  Prenatal complications: no prenatal complications Prenatal Complications - Diabetes: none.      Blood pressure (!) 106/48, pulse 71, temperature 98.5 F (36.9 C), temperature source Oral, resp. rate 17, height 5\' 9"  (1.753 m), weight 164 lb 7 oz (74.6 kg), last menstrual period 09/05/2016, SpO2 100 %, unknown if currently breastfeeding. Maternal Exam:  Abdomen: Patient reports the following abdominal tenderness: epigastric.  Fundal height is Below the umbilicus.       Physical Exam  Constitutional: She is oriented to person, place, and time. She appears well-developed and well-nourished.  GI: Soft. There is tenderness in the epigastric area.  Neurological: She is alert and oriented to person, place, and time.  Skin: Skin is warm and dry.  Psychiatric: She has a normal mood and affect. Her behavior is normal.    Prenatal labs: ABO, Rh: --/--/B POS (03/06 1152) Antibody: NEG (03/06 1152) Rubella:   RPR:    HBsAg:  HIV:    GBS:     Assessment/Plan: 41yo G4P3003 at 14 weeks with EPEC; recurrence of GI symptoms -IVF -Azithromycin restarted -Plan to consult GI -SCDs -FHT q 8  Kery Batzel 04/26/2017, 5:22 AM

## 2017-04-26 NOTE — Progress Notes (Signed)
Patient still feels very weak.  She is still having cramping and loose watery stools.  BP 125/66 (BP Location: Left Arm)   Pulse 76   Temp 98.1 F (36.7 C) (Oral)   Resp 17   Ht 5\' 9"  (1.753 m)   Wt 74.6 kg (164 lb 7 oz)   LMP 09/05/2016 (Exact Date)   SpO2 100%   BMI 24.28 kg/m  Results for orders placed or performed during the hospital encounter of 04/26/17 (from the past 24 hour(s))  Urinalysis, Routine w reflex microscopic     Status: None   Collection Time: 04/26/17  2:00 AM  Result Value Ref Range   Color, Urine YELLOW YELLOW   APPearance CLEAR CLEAR   Specific Gravity, Urine 1.008 1.005 - 1.030   pH 6.0 5.0 - 8.0   Glucose, UA NEGATIVE NEGATIVE mg/dL   Hgb urine dipstick NEGATIVE NEGATIVE   Bilirubin Urine NEGATIVE NEGATIVE   Ketones, ur NEGATIVE NEGATIVE mg/dL   Protein, ur NEGATIVE NEGATIVE mg/dL   Nitrite NEGATIVE NEGATIVE   Leukocytes, UA NEGATIVE NEGATIVE  CBC     Status: Abnormal   Collection Time: 04/26/17  5:51 AM  Result Value Ref Range   WBC 8.0 4.0 - 10.5 K/uL   RBC 4.04 3.87 - 5.11 MIL/uL   Hemoglobin 12.3 12.0 - 15.0 g/dL   HCT 34.8 (L) 36.0 - 46.0 %   MCV 86.1 78.0 - 100.0 fL   MCH 30.4 26.0 - 34.0 pg   MCHC 35.3 30.0 - 36.0 g/dL   RDW 12.4 11.5 - 15.5 %   Platelets 197 150 - 400 K/uL  Comprehensive metabolic panel     Status: Abnormal   Collection Time: 04/26/17  5:51 AM  Result Value Ref Range   Sodium 134 (L) 135 - 145 mmol/L   Potassium 3.5 3.5 - 5.1 mmol/L   Chloride 108 101 - 111 mmol/L   CO2 20 (L) 22 - 32 mmol/L   Glucose, Bld 85 65 - 99 mg/dL   BUN 7 6 - 20 mg/dL   Creatinine, Ser 0.62 0.44 - 1.00 mg/dL   Calcium 8.5 (L) 8.9 - 10.3 mg/dL   Total Protein 6.1 (L) 6.5 - 8.1 g/dL   Albumin 3.5 3.5 - 5.0 g/dL   AST 14 (L) 15 - 41 U/L   ALT 14 14 - 54 U/L   Alkaline Phosphatase 51 38 - 126 U/L   Total Bilirubin 0.4 0.3 - 1.2 mg/dL   GFR calc non Af Amer >60 >60 mL/min   GFR calc Af Amer >60 >60 mL/min   Anion gap 6 5 - 15  Lipase,  blood     Status: None   Collection Time: 04/26/17  5:51 AM  Result Value Ref Range   Lipase 26 11 - 51 U/L  Type and screen [place]     Status: None   Collection Time: 04/26/17  5:51 AM  Result Value Ref Range   ABO/RH(D) B POS    Antibody Screen NEG    Sample Expiration      04/29/2017 Performed at [facility], [address], [place], [place] [address]    IMPRESSION: IUP at 14 weeks Enteropathogenic E Coli  PLAN: GI Consult with [facility] requested this morning. They will come to evaluate the patient  Continue antiemetics and IV Fluids Universal precautions Plan shared with patient

## 2017-04-26 NOTE — Consult Note (Addendum)
Consultation  Referring Provider: Dr Jaynie Collins Care Physician:  Aretta Nip, MD Primary Gastroenterologist:  Dr.Nandigam  Reason for Consultation:  Diarrhea, nausea  HPI: Ellen Vasquez is a 42 y.o. female who is now [redacted] weeks pregnant. She required admission last week 04/21/2017 through 04/24/2017 with persistent diarrhea, nausea with occasional vomiting, and weakness. She was seen in consultation by GI/Dr. Silverio Decamp. Patient says that her diarrhea started in January, and was initially milder with 3-4 loose bowel movements per day, nonbloody, and mild abdominal cramping. Her appetite had been decreased and she has lost from 174 pounds 166 pounds. About 2 weeks ago her diarrhea worsened and has been persistent since. On a bad day she will have 8-9 liquid to watery stools per day, which a been associated with abdominal pain and cramping as well as nausea. During her last admission GI pathogen panel returned positive for enteropathic Escherichia coli and she was treated with a three-day course of Zithromax. Stool for C. difficile by quick scan was negative, routine stool for O&P had not resulted however no parasites identified on GI pathogen panel. Patient improved with IV fluids, anti-emetics, and was discharged home on Saturday 3/9 off antibiotics. She says within 4 hours of being home diarrhea was much worse and she had at least 9 episodes of watery diarrhea over the next 24 hours. She has had significant weakness, abdominal cramping and now more upper abdominal pain. No fever or chills. She was readmitted last night. Routine labs are unremarkable. Zithromax was restarted, patient does not have good results with Zofran, feels better after Phenergan. Main complaint today is abdominal pain and cramping Patient has not had any known infectious exposures, she has no prior GI history, she did take a course of amoxicillin in December for an ear infection.   Past Medical  History:  Diagnosis Date  . Anxiety   . Chiari malformation type I ([place])    MRI [facility] 2013  . Depression    PPD with 2nd baby  . Endometriosis 1997  . Headache   . Headache in pregnancy   . Hearing loss   . Kidney infection   . Kidney stones   . Otosclerosis 2008  . Otosclerosis     Past Surgical History:  Procedure Laterality Date  . DILATION AND CURETTAGE OF UTERUS  May 2015  . LAPAROSCOPIC ABDOMINAL EXPLORATION  06/2013   has had 4 surgeries  . SHOULDER SURGERY  2000  . TONSILLECTOMY AND ADENOIDECTOMY  2010  . [place] EXTRACTION  2008    Prior to Admission medications   Medication Sig Start Date End Date Taking? Authorizing Provider  Prenat w/o [id number] (CITRANATAL ASSURE PO) Take 1 tablet by mouth daily.   Yes [provider]  scopolamine (TRANSDERM-SCOP) 1 MG/3DAYS Place 1 patch (1.5 mg total) onto the skin every 3 (three) days. 04/26/17  Yes Morris, Megan, DO  sertraline (ZOLOFT) 100 MG tablet Take 100 mg by mouth daily.   Yes [provider]  acetaminophen (TYLENOL) 500 MG tablet Take 1,000 mg by mouth every 6 (six) hours as needed for headache.    [provider]  butalbital-acetaminophen-caffeine (FIORICET, ESGIC) 50-325-40 MG tablet Take 1-2 tablets by mouth every 6 (six) hours as needed for headache. Patient not taking: Reported on 04/21/2017 03/27/17 03/27/18  Wende Mott, CNM  calcium carbonate (TUMS - DOSED IN MG ELEMENTAL CALCIUM) 500 MG chewable tablet Chew 2 tablets by mouth daily as needed for indigestion or heartburn.  [provider]    Current Facility-Administered Medications  Medication Dose Route Frequency Provider Last Rate Last Dose  . acetaminophen (TYLENOL) tablet 650 mg  650 mg Oral Q4H PRN Morris, Megan, DO      . azithromycin [facility]) tablet 500 mg  500 mg Oral Daily Morris, Megan, DO      . calcium carbonate (TUMS - dosed in mg elemental calcium) chewable tablet 400 mg of elemental calcium   2 tablet Oral Q4H PRN Morris, Megan, DO      . lactated ringers infusion   Intravenous Continuous Morris, Jinny Blossom, DO 125 mL/hr at 04/26/17 0807    . ondansetron (ZOFRAN) injection 4 mg  4 mg Intravenous Q6H PRN Vasquez, Ellen S, PA-C      . prenatal multivitamin tablet 1 tablet  1 tablet Oral Q1200 Morris, Megan, DO      . scopolamine (TRANSDERM-SCOP) 1 MG/3DAYS 1.5 mg  1 patch Transdermal Q72H Morris, Megan, DO      . sertraline (ZOLOFT) tablet 100 mg  100 mg Oral Daily Morris, Megan, DO      . zolpidem (AMBIEN) tablet 5 mg  5 mg Oral QHS PRN Morris, Megan, DO       Facility-Administered Medications Ordered in Other Encounters  Medication Dose Route Frequency Provider Last Rate Last Dose  . gadopentetate dimeglumine (MAGNEVIST) injection 15 mL  15 mL Intravenous Once PRN Dohmeier, Asencion Partridge, MD        Allergies as of 04/26/2017 - Review Complete 04/26/2017  Allergen Reaction Noted  . Shellfish allergy Hives 01/04/2013  . Verapamil  03/13/2015  . Other  06/12/2014  . Sulfa antibiotics Dermatitis and Rash 06/12/2014    Family History  Problem Relation Age of Onset  . Asthma Father   . Arthritis Father        Rheumatoid  . Cancer Maternal Grandmother        skin  . Hypertension Other   . Hyperlipidemia Other   . Stroke Other   . Obesity Other   . Aneurysm Paternal Grandfather        brain  . Skin cancer Paternal Grandmother   . Migraines Neg Hx     Social History   Socioeconomic History  . Marital status: Married    Spouse name: Not on file  . Number of children: 2  . Years of education: Phd ED  . Highest education level: Not on file  Social Needs  . Financial resource strain: Not on file  . Food insecurity - worry: Not on file  . Food insecurity - inability: Not on file  . Transportation needs - medical: Not on file  . Transportation needs - non-medical: Not on file  Occupational History  . Occupation: Professor    Comment: Hydrographic surveyor  Tobacco Use  . Smoking  status: Former Smoker    Types: Cigarettes    Last attempt to quit: 11/25/2001    Years since quitting: 15.4  . Smokeless tobacco: Never Used  Substance and Sexual Activity  . Alcohol use: Yes    Alcohol/week: 0.6 oz    Types: 1 Glasses of wine per week    Comment: social events  . Drug use: No  . Sexual activity: Yes    Birth control/protection: None  Other Topics Concern  . Not on file  Social History Narrative   Caffeine none (occasional soda).    Review of Systems: Pertinent positive and negative review of systems were noted in the above HPI section.  All other review of systems was otherwise negative.  Physical Exam: Vital signs in last 24 hours: Temp:  [98.1 F (36.7 C)-98.6 F (37 C)] 98.1 F (36.7 C) (03/11 0745) Pulse Rate:  [71-79] 76 (03/11 0745) Resp:  [17-19] 17 (03/11 0745) BP: (106-125)/(48-66) 125/66 (03/11 0745) SpO2:  [100 %] 100 % (03/11 0745) Weight:  [164 lb 7 oz (74.6 kg)] 164 lb 7 oz (74.6 kg) (03/11 0244) Last BM Date: 04/26/17 General:   Alert,  Well-developed, white female well-nourished, pleasant and cooperative in NAD-husband at bedside Head:  Normocephalic and atraumatic. Eyes:  Sclera clear, no icterus.   Conjunctiva pink. Ears:  Normal auditory acuity. Nose:  No deformity, discharge,  or lesions. Mouth:  No deformity or lesions.   Neck:  Supple; no masses or thyromegaly. Lungs:  Clear throughout to auscultation.   No wheezes, crackles, or rhonchi. Heart:  Regular rate and rhythm; no murmurs, clicks, rubs,  or gallops. Abdomen:  Soft, mild rather diffuse tenderness, BS active,nonpalp mass or hsm.   Rectal:  Deferred  Msk:  Symmetrical without gross deformities. . Pulses:  Normal pulses noted. Extremities:  Without clubbing or edema. Neurologic:  Alert and  oriented x4;  grossly normal neurologically. Skin:  Intact without significant lesions or rashes.. Psych:  Alert and cooperative. Normal mood and affect.  Intake/Output from previous  day: No intake/output data recorded. Intake/Output this shift: No intake/output data recorded.  Lab Results: Recent Labs    04/23/17 1538 04/26/17 0551  WBC 7.8 8.0  HGB 12.3 12.3  HCT 34.5* 34.8*  PLT 194 197   BMET Recent Labs    04/23/17 1538 04/26/17 0551  NA 134* 134*  K 3.3* 3.5  CL 109 108  CO2 20* 20*  GLUCOSE 100* 85  BUN <5* 7  CREATININE 0.50 0.62  CALCIUM 8.5* 8.5*   LFT Recent Labs    04/26/17 0551  PROT 6.1*  ALBUMIN 3.5  AST 14*  ALT 14  ALKPHOS 51  BILITOT 0.4     IMPRESSION:   #12 42 year old white female, G4, P3 who is now [redacted] weeks pregnant, and readmitted with acute gastroenteritis type illness with 6 week history of diarrhea, progressive over the past couple of weeks, associated abdominal pain cramping nausea and occasional vomiting. She has had about an 8 pound weight loss. Studies during last admission last week positive for enteropathic Escherichia coli, and she was treated with a three-day course of Zithromax prior to discharge. Patient readmitted last night with worsening symptoms, and inability to eat and keep herself well-hydrated.  Escherichia coli is generally a self-limited illness, and doesn't always requiring antibiotics. Also unusual that this would be persistent for 6 weeks. I'm concerned she may have C. difficile despite negative quick scan. Other possibilities include new IBD  Plan; Continue IV fluid hydration Soft  bland low residue diet-discussed with patient and she will choose from manual appropriately Phenergan 25 mg by mouth or IV every 6-8 hours when necessary Start dicyclomine 10 mg by mouth 3 times a day for cramping, and abdominal pain Repeat stool for C. difficile by quick scan, need PCR Continue Zithromax today We will follow with you  Ellen Esterwood  PA-C/[facility]  [phone]  GI ATTENDING  History of data reviewed. Previous GI evaluation reviewed. Agree with GI physician assistant consultation note  as outlined above as well as subsequent impression and plans. GI team will follow  Docia Chuck. Geri Seminole., M.D. [facility] Division of Gastroenterology

## 2017-04-26 NOTE — MAU Note (Signed)
Patient states she started having unbearable upper abdominal pain tonight.  Was discharged from the hospital on Saturday for ABX treatment for [facility].  Reports diarrhea x9 in the past 24 hours.

## 2017-04-27 ENCOUNTER — Encounter (HOSPITAL_COMMUNITY): Payer: Self-pay | Admitting: *Deleted

## 2017-04-27 DIAGNOSIS — R198 Other specified symptoms and signs involving the digestive system and abdomen: Secondary | ICD-10-CM

## 2017-04-27 DIAGNOSIS — O26891 Other specified pregnancy related conditions, first trimester: Secondary | ICD-10-CM

## 2017-04-27 DIAGNOSIS — R109 Unspecified abdominal pain: Secondary | ICD-10-CM

## 2017-04-27 DIAGNOSIS — R110 Nausea: Secondary | ICD-10-CM

## 2017-04-27 LAB — C DIFFICILE QUICK SCREEN W PCR REFLEX
C DIFFICILE (CDIFF) INTERP: NOT DETECTED
C DIFFICILE (CDIFF) TOXIN: NEGATIVE
C DIFFICLE (CDIFF) ANTIGEN: NEGATIVE

## 2017-04-27 MED ORDER — ONDANSETRON HCL 4 MG/2ML IJ SOLN
4.0000 mg | Freq: Four times a day (QID) | INTRAMUSCULAR | Status: AC
  Administered 2017-04-27 – 2017-04-29 (×7): 4 mg via INTRAVENOUS
  Filled 2017-04-27 (×7): qty 2

## 2017-04-27 MED ORDER — DICYCLOMINE HCL 10 MG PO CAPS
20.0000 mg | ORAL_CAPSULE | Freq: Three times a day (TID) | ORAL | Status: DC
  Administered 2017-04-27 – 2017-04-30 (×9): 20 mg via ORAL
  Filled 2017-04-27 (×12): qty 2

## 2017-04-27 NOTE — Progress Notes (Signed)
Patient ID: Ellen Vasquez, female   DOB: January 28, 1976, 42 y.o.   MRN: [id number] Pt reports semi formed stool today No diarrhea "stomach still hurts" VSSAF FHR +  Abd soft nt  Gastroenteritis -  Stool sample obtained Will await GI recommendations Ellen Vasquez appears stable and last Cmet Pkg essentially normal DL

## 2017-04-27 NOTE — Progress Notes (Addendum)
Daily Rounding Note  04/27/2017, 11:43 AM  LOS: 1 day   SUBJECTIVE:   Chief complaint: epigastric pain, Bentyl helps but pain never ceases.  Nausea and dry heaves persist, tolerating some clears but not solids.  Stools now muddy, liquid/soft, brown.  None overnight and 3 so far 9 to noon today.  This c/w 20 watery stools/day in previous 2 days.           OBJECTIVE:         Vital signs in last 24 hours:    Temp:  [98 F (36.7 C)-98.8 F (37.1 C)] 98.6 F (37 C) (03/12 0750) Pulse Rate:  [65-79] 79 (03/12 0750) Resp:  [16-18] 16 (03/12 0750) BP: (103-116)/(51-66) 116/66 (03/12 0750) SpO2:  [100 %] 100 % (03/12 0750) Last BM Date: 04/26/17 Filed Weights   04/26/17 0244  Weight: 164 lb 7 oz (74.6 kg)   General: not ill looking.  Comfortable, alert.     Heart: RRR Chest: clear bil.  No cough of dyspnea Abdomen: soft, ND.  Scant BS but no tinkling or tympanitic BS.  Minor upper abdominal tenderness  Extremities: no CCE Neuro/Psych:  Alert, calm, oriented x 3.  Excellent historian.    Intake/Output from previous day: 03/11 0701 - 03/12 0700 In: -  Out: 2600 [Urine:2600]  Intake/Output this shift: Total I/O In: -  Out: 300 [Urine:300]  Lab Results: Recent Labs    04/26/17 0551  WBC 8.0  HGB 12.3  HCT 34.8*  PLT 197   BMET Recent Labs    04/26/17 0551  NA 134*  K 3.5  CL 108  CO2 20*  GLUCOSE 85  BUN 7  CREATININE 0.62  CALCIUM 8.5*   LFT Recent Labs    04/26/17 0551  PROT 6.1*  ALBUMIN 3.5  AST 14*  ALT 14  ALKPHOS 51  BILITOT 0.4   Scheduled Meds: . azithromycin  500 mg Oral Daily  . dicyclomine  10 mg Oral TID AC  . prenatal multivitamin  1 tablet Oral Q1200  . scopolamine  1 patch Transdermal Q72H  . sertraline  100 mg Oral Daily   Continuous Infusions: . lactated ringers 125 mL/hr at 04/27/17 0747   PRN Meds:.acetaminophen, calcium carbonate, ondansetron,  zolpidem   ASSESMENT:   *   Gastroenteritis.  + enteropathic E coli 04/21/17 completed 3 days Zithromax..  6 weeks diarrhea and more recent abdominal pain, nausea, vomiting.  Weight loss.  Readmission 04/26/17 with dehydration.  Dicyclomine scheduled, Phenergan as needed, daily Zithromax in place. Got 1 dose of Pepcid IV.   C. difficile quick scan study collected this morning, results pending, previously negative on 3/6.  .  *   First trimester pregnancy.   PLAN   *  Continue current meds.  ? Start daily IV Pepcid?  ? Convert prn Phenergan to scheduled?  Dr Henrene Pastor to round today, will d/w him.   May need to switch back to clear or full liquids, but currently regular diet ordered.     Azucena Freed  04/27/2017, 11:43 AM Pager: [phone]  GI ATTENDING  Interval history data reviewed. Patient personally seen and examined. Father in room. Overall improvement though still with various symptoms. Repeat Clostridium difficile testing pending Recommend the following: 1. Stop antibiotics. She has received an adequate therapy . This may be contributing to some GI symptoms 2. Increase Bentyl to 20 mg as needed for cramping 3. Give Zofran 4 mg IV RUNNING  for nausea 4. Continue with adequate hydration. Will increase IV fluids admit 5. Continue to monitor stools 6. We will follow  Docia Chuck. Geri Seminole., M.D. [facility] Division of Gastroenterology

## 2017-04-28 MED ORDER — PROMETHAZINE HCL 25 MG/ML IJ SOLN
12.5000 mg | Freq: Four times a day (QID) | INTRAMUSCULAR | Status: DC | PRN
  Administered 2017-04-28 – 2017-04-29 (×2): 12.5 mg via INTRAVENOUS
  Filled 2017-04-28 (×2): qty 1

## 2017-04-28 MED ORDER — CYCLOBENZAPRINE HCL 5 MG PO TABS
5.0000 mg | ORAL_TABLET | Freq: Three times a day (TID) | ORAL | Status: DC | PRN
  Filled 2017-04-28: qty 1

## 2017-04-28 NOTE — Progress Notes (Addendum)
          Daily Rounding Note  04/28/2017, 12:34 PM  LOS: 2 days   SUBJECTIVE Went from 6 PM yest to noon today without a BM but the stool today was loose.  Still same epigastric and left abdominal pain, as bad as at admission.  Still nauseated, no emesis today.   Put out 2 iters urine yesterday  OBJECTIVE:         Vital signs in last 24 hours:    Temp:  [97.5 F (36.4 C)-98.3 F (36.8 C)] 97.5 F (36.4 C) (03/13 1207) Pulse Rate:  [63-66] 66 (03/13 1207) Resp:  [16-18] 17 (03/13 1207) BP: (106-115)/(54-68) 114/68 (03/13 1207) SpO2:  [99 %-100 %] 100 % (03/13 1207) Last BM Date: 04/26/17 Filed Weights   04/26/17 0244  Weight: 164 lb 7 oz (74.6 kg)   General: looks well   Heart: RRR Chest: clear bil Abdomen: soft,  Mild epigastric and left tenderness.  Active BS  Extremities: no CCE Neuro/Psych:  Alert, oriented x 3.  No tremor.    Intake/Output from previous day: 03/12 0701 - 03/13 0700 In: -  Out: 3900 [Urine:3900]  Intake/Output this shift: Total I/O In: -  Out: 2000 [Urine:2000]  Lab Results: Recent Labs    04/26/17 0551  WBC 8.0  HGB 12.3  HCT 34.8*  PLT 197   BMET Recent Labs    04/26/17 0551  NA 134*  K 3.5  CL 108  CO2 20*  GLUCOSE 85  BUN 7  CREATININE 0.62  CALCIUM 8.5*   LFT Recent Labs    04/26/17 0551  PROT 6.1*  ALBUMIN 3.5  AST 14*  ALT 14  ALKPHOS 51  BILITOT 0.4   Scheduled Meds: . dicyclomine  20 mg Oral TID AC  . ondansetron  4 mg Intravenous Q6H  . prenatal multivitamin  1 tablet Oral Q1200  . scopolamine  1 patch Transdermal Q72H  . sertraline  100 mg Oral Daily   Continuous Infusions: . lactated ringers 150 mL/hr at 04/27/17 1551   PRN Meds:.acetaminophen, calcium carbonate, zolpidem   ASSESMENT:   *   Gastroenteritis.  + enteropathic E coli 04/21/17 completed 3 days Zithromax..  6 weeks diarrhea and more recent abdominal pain, nausea, vomiting.  Weight  loss.  Readmission 04/26/17 with dehydration.  Scheduled Bentyl, IV zofran, Scopalamine patch.  Zithromax discontinued 3/12.   C. difficile negative 3/6 and 3/12.  Stool path panel not repeated.   Only improvement is of less frequent, though still loose stools.  Pain, nausea continue.    *   First trimester pregnancy.    PLAN   *  No change in current measures.      Azucena Freed  04/28/2017, 12:34 PM Pager: [phone]  GI ATTENDING  Interval history data reviewed. Patient clinically stable with slow improvement. Continue with supportive care measures as outlined above. We will continue to follow  Docia Chuck. Geri Seminole., M.D. [facility] Division of Gastroenterology

## 2017-04-28 NOTE — Progress Notes (Signed)
S: Tolerating liquids. Two semi formed stool yesterday AM followed by 4 watery. No stools overnight. Denies emesis.   O: AFVSS Well appearing, conversive, smiling, NAD Abd soft, minimal ttp Ext no swelling b/l  A/P: 41yo @ 14.2 wga w/Enteropathic E. Coli C diff neg Seems to be improving Await GI recs   Arty Baumgartner MD

## 2017-04-29 ENCOUNTER — Encounter (HOSPITAL_COMMUNITY): Payer: Self-pay

## 2017-04-29 ENCOUNTER — Inpatient Hospital Stay (HOSPITAL_COMMUNITY): Payer: BLUE CROSS/BLUE SHIELD

## 2017-04-29 DIAGNOSIS — A09 Infectious gastroenteritis and colitis, unspecified: Secondary | ICD-10-CM

## 2017-04-29 MED ORDER — COLESTIPOL HCL 1 G PO TABS
2.0000 g | ORAL_TABLET | Freq: Two times a day (BID) | ORAL | Status: DC
  Administered 2017-04-29 – 2017-04-30 (×3): 2 g via ORAL
  Filled 2017-04-29 (×5): qty 2

## 2017-04-29 MED ORDER — PSYLLIUM 95 % PO PACK
1.0000 | PACK | Freq: Every day | ORAL | Status: DC
  Administered 2017-04-29 – 2017-04-30 (×2): 1 via ORAL
  Filled 2017-04-29 (×3): qty 1

## 2017-04-29 NOTE — Progress Notes (Signed)
Dr. Matthew Saras notified of pt's complaint of headache, blurred vision when looking at a distance, and double vision when looking up close. No new orders at this time.

## 2017-04-29 NOTE — Progress Notes (Addendum)
     [facility] Progress Note   Chief Complaint:  diarrhea  SUBJECTIVE:   Had 3 soft BMs yesterday followed by 7 large volume liquid stools. No blood in stools. She reported no BMs yet today though RN has now informed me that she is having a BM now.  Food / liquids definitely trigger diarrhea. No nocturnal diarrhea. She is nauseated but no vomiting. Still has abdominal discomfort associated with the diarrhea.    ASSESSMENT AND PLAN:   42 yo female with persistent diarrhea / nausea / abdominal discomfort following recent enteropathogenic e.coli infection (EPEC) . Improved with Azithromycin but relapsed within a couple of days. C-diff negative x2.  -After consulting with pharmacy regarding safety in pregnancy, will start Colestid 2 grams BID. In addition will start her on a soluble fiber to help absorb some of the fluid in lumen.  -Avoid diary for now.  -Phenergan works better then Zofran for nausea so would continue it as needed.   -Will reassess patient tomorrow. I spoke with OB/GYN Dr. Matthew Saras about above plans. He has asked that patient be transferred to [facility] if not improving by tomorrow given that patient's acute issues are non-obstretical. He is happy to see her in consult at the hospital.     OBJECTIVE:     Vital signs in last 24 hours: Temp:  [97.5 F (36.4 C)-98.9 F (37.2 C)] 97.5 F (36.4 C) (03/14 1243) Pulse Rate:  [58-64] 61 (03/14 1243) Resp:  [16-18] 18 (03/14 1243) BP: (106-112)/(52-63) 112/61 (03/14 1243) SpO2:  [99 %-100 %] 99 % (03/14 1243) Last BM Date: 04/29/17 General:   Alert, well-developed, white female in NAD EENT:  Normal hearing, non icteric sclera, conjunctive pink.  Heart:  Regular rate and rhythm; no murmurs. No lower extremity edema Pulm: Normal respiratory effort, lungs CTA bilaterally without wheezes or crackles. Abdomen:  Soft, nondistended, nontender.  Normal bowel sounds, no masses felt. No hepatomegaly.    Neurologic:  Alert  and  oriented x4;  grossly normal neurologically. Psych:  Pleasant, cooperative.  Normal mood and affect.   Intake/Output from previous day: 03/13 0701 - 03/14 0700 In: -  Out: 5700 [Urine:5700] Intake/Output this shift: Total I/O In: -  Out: 600 [Urine:600]  Active Problems:   Gastrointestinal symptoms during pregnancy in first trimester, antepartum    LOS: 3 days   Tye Savoy ,NP 04/29/2017, 1:46 PM  Pager number [phone]  GI ATTENDING  Interval history data reviewed. Patient personally seen and examined. Husband in room. Still with small bowel type symptoms including nausea, cramping, and episodic explosive diarrhea. Overall though, she does not look toxic. No worrisome features such as fever or bleeding. She is able to take by PO's without vomiting. We will add Colestid and fiber. My expectation is that she will improve with time and supportive care. If she can demonstrate the ability to take adequate PO's, she could go home with close outpatient GI follow-up. We will see her tomorrow  Docia Chuck. Geri Seminole., M.D. [facility] Division of Gastroenterology

## 2017-04-29 NOTE — Progress Notes (Signed)
Patient ID: Ellen Vasquez, female   DOB: 1975/07/14, 42 y.o.   MRN: [id number]   [redacted]w[redacted]d  Discussion w/ pt + husband, still not improved as far as diarrhea is concerned. They request [place] today and our office will call to make sure GI discusses with them further eval options. They are willing to be transferred to [facility] to GI service if needed.

## 2017-04-30 DIAGNOSIS — R197 Diarrhea, unspecified: Secondary | ICD-10-CM | POA: Diagnosis present

## 2017-04-30 MED ORDER — ACETAMINOPHEN 325 MG PO TABS: 650.0000 mg | ORAL_TABLET | ORAL | 0 refills | Status: DC | PRN

## 2017-04-30 MED ORDER — DICYCLOMINE HCL 10 MG PO CAPS: 20.0000 mg | ORAL_CAPSULE | Freq: Three times a day (TID) | ORAL | 0 refills | Status: DC

## 2017-04-30 MED ORDER — PROMETHAZINE HCL 12.5 MG PO TABS: 12.5000 mg | ORAL_TABLET | Freq: Four times a day (QID) | ORAL | 0 refills | Status: DC | PRN

## 2017-04-30 MED ORDER — COLESTIPOL HCL 1 G PO TABS: 2.0000 g | ORAL_TABLET | Freq: Two times a day (BID) | ORAL | 1 refills | Status: DC

## 2017-04-30 NOTE — Progress Notes (Signed)
All discharge instructions completed with the patient. All printed discharge instructions given and explained to the patient. Patient verbalizes an understanding. No questions or concerns voiced at this time.

## 2017-04-30 NOTE — Plan of Care (Signed)
Patient resting in bed at this time. Denies any pain or discomfort, also denies any diarrhea at this time. Doppler fetal HR at 145. Patient is voiding without any difficulty.

## 2017-04-30 NOTE — Progress Notes (Addendum)
[facility] Progress Note   Chief Complaint: diarrhea     SUBJECTIVE:    Better. Now diarrhea / BMs since 5: 30pm yesterday. Tolerating Colestid and Metamucil. Still has upper abdominal discomfort, nearly constant but worse with meals.    ASSESSMENT AND PLAN:   42 yo female with persistent diarrhea / nausea / upper abdominal discomfort following recent enteropathogenic e.coli infection. Started soluble fiber and cholestyramine yesterday and no diarrhea since. Phenergan works better for nausea than zofran. She is tolerating PO and okay with being discharged home.  -continue Colestid 2 gm BID. HOLD if no BM in 2 days.  -continue Metamucil fiber daily -continue prn phenergan for nausea. Cautioned about sedation -continue Bentyl for upper abdominal pain. She understands that it is a potentially constipating medication and as diarrheal illness runs course she should try and decrease Bentyl as cramps improve.  -I have made her a follow up with me for next Thursday. She will call office in interim for GI questions / problems      OBJECTIVE:     Vital signs in last 24 hours: Temp:  [97.5 F (36.4 C)-98.4 F (36.9 C)] 97.8 F (36.6 C) (03/15 1229) Pulse Rate:  [63-79] 79 (03/15 1229) Resp:  [18] 18 (03/15 1229) BP: (103-117)/(52-65) 114/52 (03/15 1229) SpO2:  [99 %-100 %] 100 % (03/15 1229) Weight:  [168 lb (76.2 kg)] 168 lb (76.2 kg) (03/14 1950) Last BM Date: 04/29/17 General:   Alert, well-developed,  white female in NAD EENT:  Normal hearing, non icteric sclera, conjunctive pink.  Heart:  Regular rate and rhythm; no murmurs. No lower extremity edema Pulm: Normal respiratory effort, lungs CTA bilaterally without wheezes or crackles. Abdomen:  Soft, nondistended, nontender.  Normal bowel sounds, no masses felt. No hepatomegaly.    Neurologic:  Alert and  oriented x4;  grossly normal neurologically. Psych:  Pleasant, cooperative.  Normal mood and  affect.   Intake/Output from previous day: 03/14 0701 - 03/15 0700 In: 150 [I.V.:150] Out: 3500 [Urine:3500] Intake/Output this shift: Total I/O In: 1047.5 [I.V.:1047.5] Out: 900 [Urine:900]   US Ob Comp Less 14 Wks  Result Date: 04/29/2017 CLINICAL DATA:  42 year old pregnant female with hyper emesis. LMP: 01/23/2017 corresponding to an estimated gestational age of [redacted] weeks, 5 days. EXAM: OBSTETRIC <14 WK ULTRASOUND TECHNIQUE: Transabdominal ultrasound was performed for evaluation of the gestation as well as the maternal uterus and adnexal regions. COMPARISON:  Ultrasound dated 03/27/2017 FINDINGS: Intrauterine gestational sac: Signal Yolk sac:  Not seen Embryo:  Present Cardiac Activity: Detected Heart Rate: 148 bpm CRL:   78 mm   13 w 6 d                  [place] EDC: 10/29/2017 Subchorionic hemorrhage:  None visualized. Maternal uterus/adnexae: The ovaries are unremarkable. No free fluid within the pelvis. IMPRESSION: Single live intrauterine pregnancy with an estimated gestational age of [redacted] weeks, 5 days based on the first ultrasound of 03/27/2017. Electronically Signed   By: Anner Crete M.D.   On: 04/29/2017 18:54    Discharge Planning Diet: avoid dairy for next 2-3 weeks Discharge Medications: see A/P Follow up: me on Thursday. Will enter appt  Active Problems:   Gastrointestinal symptoms during pregnancy in first trimester, antepartum   Diarrhea    LOS: 4 days   Tye Savoy ,NP 04/30/2017, 2:37 PM  Pager number [phone]  GI ATTENDING  Interval history data reviewed. Agree with interval progress note. Glad that the  patient's feeling better after initiation of Colestid. Okay for discharge. Continue with symptomatic therapies. Close GI follow-up arranged for this week  Docia Chuck. Geri Seminole., M.D. [facility] Division of Gastroenterology

## 2017-04-30 NOTE — Progress Notes (Signed)
14 4/7 weeks Better today Just finished breakfast-diarrhea usually occurs an hour or so after meals  Vitals:   04/30/17 0444 04/30/17 0749  BP: 107/60 117/65  Pulse: 67 64  Resp: 18 18  Temp: 98 F (36.7 C) 98.4 F (36.9 C)  SpO2: 100% 100%   U/S 04/29/17 Single live intrauterine pregnancy with an estimated gestational age of [redacted] weeks, 5 days based on the first ultrasound of 03/27/2017.  A/P: 14 4/7 wks          Diarrhea - observe, GI to see patient today

## 2017-04-30 NOTE — Discharge Summary (Signed)
Physician Discharge Summary  Patient ID: ESBEIDY MCLAINE MRN: [id number] DOB/AGE: 1976-01-11 42 y.o.  Admit date: 04/26/2017 Discharge date: 04/30/2017  Admission Diagnoses:gastrointestinal symptoms during pregnancy in first trimester Diarrhea  Discharge Diagnoses:  Active Problems:   Gastrointestinal symptoms during pregnancy in first trimester, antepartum   Diarrhea   Discharged Condition: good  Hospital Course: Started on IV fluids. GI consultation obtained. Patient got relief of diarrhea with Colestid and metamucil. Her nausea was relieved with phenergan. Upon discharge tolerating regular diet and no diarrhea in over 12 hours. U/S showed normal IUP.  Consults: GI  Significant Diagnostic Studies: labs:  Results for orders placed or performed during the hospital encounter of 04/26/17 (from the past 72 hour(s))  C difficile quick scan w PCR reflex     Status: None   Collection Time: 04/27/17  5:45 PM  Result Value Ref Range   C Diff antigen NEGATIVE NEGATIVE   C Diff toxin NEGATIVE NEGATIVE   C Diff interpretation No C. difficile detected.     Comment: Performed at [facility], [place] [address], [place], [place] [address]   and radiology: Single live intrauterine pregnancy with an estimated gestational age of [redacted] weeks, 5 days based on the first ultrasound of 03/27/2017  Treatments: IV hydration and Colestid, metamucil, phenergan  Discharge Exam: Blood pressure (!) 114/52, pulse 79, temperature 97.8 F (36.6 C), temperature source Oral, resp. rate 18, height 5\' 9"  (1.753 m), weight 168 lb (76.2 kg), last menstrual period 09/05/2016, SpO2 100 %, unknown if currently breastfeeding. General appearance: alert, cooperative and no distress  Disposition: 01-Home or Self Care   Allergies as of 04/30/2017      Reactions   Shellfish Allergy Hives   Verapamil    GI sx, weakness   Other    Surgical glue causes a rash   Sulfa Antibiotics Dermatitis, Rash       Medication List    STOP taking these medications   butalbital-acetaminophen-caffeine 50-325-40 MG tablet Commonly known as:  FIORICET, ESGIC   scopolamine 1 MG/3DAYS Commonly known as:  TRANSDERM-SCOP     TAKE these medications   acetaminophen 325 MG tablet Commonly known as:  TYLENOL Take 2 tablets (650 mg total) by mouth every 4 (four) hours as needed (for pain scale < 4  OR  temperature  >/=  100.5 F).   CITRANATAL ASSURE PO Take 1 tablet by mouth daily.   colestipol 1 g tablet Commonly known as:  COLESTID Take 2 tablets (2 g total) by mouth 2 (two) times daily.   dicyclomine 10 MG capsule Commonly known as:  BENTYL Take 2 capsules (20 mg total) by mouth 3 (three) times daily before meals.   promethazine 12.5 MG tablet Commonly known as:  PHENERGAN Take 1 tablet (12.5 mg total) by mouth every 6 (six) hours as needed for nausea or vomiting.   sertraline 100 MG tablet Commonly known as:  ZOLOFT Take 100 mg by mouth daily.      Follow-up Information    Willia Craze, NP Follow up on 05/06/2017.   Specialty:  Gastroenterology Why:  at 2:30pm Contact information: [place] [address] [phone]           Signed: Shon Millet II 04/30/2017, 3:06 PM

## 2017-05-06 ENCOUNTER — Ambulatory Visit: Payer: BLUE CROSS/BLUE SHIELD | Admitting: Nurse Practitioner

## 2017-05-11 ENCOUNTER — Ambulatory Visit: Payer: BLUE CROSS/BLUE SHIELD | Admitting: Cardiovascular Disease

## 2017-06-01 DIAGNOSIS — Z363 Encounter for antenatal screening for malformations: Secondary | ICD-10-CM | POA: Diagnosis not present

## 2017-06-01 DIAGNOSIS — O09522 Supervision of elderly multigravida, second trimester: Secondary | ICD-10-CM | POA: Diagnosis not present

## 2017-06-01 DIAGNOSIS — Z3A19 19 weeks gestation of pregnancy: Secondary | ICD-10-CM | POA: Diagnosis not present

## 2017-06-14 DIAGNOSIS — R5383 Other fatigue: Secondary | ICD-10-CM | POA: Diagnosis not present

## 2017-06-22 ENCOUNTER — Inpatient Hospital Stay (HOSPITAL_COMMUNITY)
Admission: AD | Admit: 2017-06-22 | Discharge: 2017-06-22 | Disposition: A | Discharge status: Home / Self Care | Payer: BLUE CROSS/BLUE SHIELD | Source: Ambulatory Visit | Attending: Obstetrics and Gynecology | Admitting: Obstetrics and Gynecology

## 2017-06-22 ENCOUNTER — Encounter (HOSPITAL_COMMUNITY): Payer: Self-pay | Admitting: Anesthesiology

## 2017-06-22 ENCOUNTER — Encounter (HOSPITAL_COMMUNITY): Payer: Self-pay

## 2017-06-22 DIAGNOSIS — Z87442 Personal history of urinary calculi: Secondary | ICD-10-CM | POA: Insufficient documentation

## 2017-06-22 DIAGNOSIS — O9989 Other specified diseases and conditions complicating pregnancy, childbirth and the puerperium: Secondary | ICD-10-CM

## 2017-06-22 DIAGNOSIS — F419 Anxiety disorder, unspecified: Secondary | ICD-10-CM | POA: Insufficient documentation

## 2017-06-22 DIAGNOSIS — Z79899 Other long term (current) drug therapy: Secondary | ICD-10-CM | POA: Insufficient documentation

## 2017-06-22 DIAGNOSIS — Z3A22 22 weeks gestation of pregnancy: Secondary | ICD-10-CM

## 2017-06-22 DIAGNOSIS — F329 Major depressive disorder, single episode, unspecified: Secondary | ICD-10-CM | POA: Diagnosis not present

## 2017-06-22 DIAGNOSIS — Z882 Allergy status to sulfonamides status: Secondary | ICD-10-CM | POA: Diagnosis not present

## 2017-06-22 DIAGNOSIS — G43909 Migraine, unspecified, not intractable, without status migrainosus: Secondary | ICD-10-CM | POA: Diagnosis not present

## 2017-06-22 DIAGNOSIS — Z87891 Personal history of nicotine dependence: Secondary | ICD-10-CM | POA: Diagnosis not present

## 2017-06-22 DIAGNOSIS — Z91013 Allergy to seafood: Secondary | ICD-10-CM | POA: Diagnosis not present

## 2017-06-22 DIAGNOSIS — O99352 Diseases of the nervous system complicating pregnancy, second trimester: Secondary | ICD-10-CM | POA: Diagnosis not present

## 2017-06-22 DIAGNOSIS — Z888 Allergy status to other drugs, medicaments and biological substances status: Secondary | ICD-10-CM | POA: Insufficient documentation

## 2017-06-22 DIAGNOSIS — Z9889 Other specified postprocedural states: Secondary | ICD-10-CM | POA: Insufficient documentation

## 2017-06-22 DIAGNOSIS — O99342 Other mental disorders complicating pregnancy, second trimester: Secondary | ICD-10-CM | POA: Insufficient documentation

## 2017-06-22 LAB — URINALYSIS, ROUTINE W REFLEX MICROSCOPIC
BILIRUBIN URINE: NEGATIVE
Glucose, UA: NEGATIVE mg/dL
HGB URINE DIPSTICK: NEGATIVE
Ketones, ur: NEGATIVE mg/dL
Leukocytes, UA: NEGATIVE
Nitrite: NEGATIVE
Protein, ur: NEGATIVE mg/dL
SPECIFIC GRAVITY, URINE: 1.003 — AB (ref 1.005–1.030)
pH: 7 (ref 5.0–8.0)

## 2017-06-22 MED ORDER — DIPHENHYDRAMINE HCL 50 MG/ML IJ SOLN
25.0000 mg | Freq: Once | INTRAMUSCULAR | Status: AC
  Administered 2017-06-22: 25 mg via INTRAVENOUS
  Filled 2017-06-22: qty 1

## 2017-06-22 MED ORDER — LACTATED RINGERS IV BOLUS
1000.0000 mL | Freq: Once | INTRAVENOUS | Status: AC
  Administered 2017-06-22: 1000 mL via INTRAVENOUS

## 2017-06-22 MED ORDER — PROMETHAZINE HCL 25 MG/ML IJ SOLN
25.0000 mg | Freq: Once | INTRAMUSCULAR | Status: AC
  Administered 2017-06-22: 25 mg via INTRAVENOUS
  Filled 2017-06-22: qty 1

## 2017-06-22 MED ORDER — KETOROLAC TROMETHAMINE 60 MG/2ML IM SOLN
60.0000 mg | Freq: Once | INTRAMUSCULAR | Status: AC
  Administered 2017-06-22: 60 mg via INTRAMUSCULAR
  Filled 2017-06-22: qty 2

## 2017-06-22 MED ORDER — DEXAMETHASONE SODIUM PHOSPHATE 10 MG/ML IJ SOLN
10.0000 mg | Freq: Once | INTRAMUSCULAR | Status: AC
  Administered 2017-06-22: 10 mg via INTRAVENOUS
  Filled 2017-06-22: qty 1

## 2017-06-22 NOTE — MAU Provider Note (Signed)
History     CSN: [id number]  Arrival date and time: 06/22/17 1153   First Provider Initiated Contact with Patient 06/22/17 1242      Chief Complaint  Patient presents with  . Migraine   Ellen Vasquez is a 42 y.o. G4P3003 at [redacted]w[redacted]d who presents today with migraine headache x 5 days. She has history of chronic migraines. She has used botox in the past with good effect. Cannot take botox during pregnancy. Also has hx of Chiari malformation Type I.   Migraine   This is a new problem. Episode onset: 06/18/17. The problem occurs constantly. The problem has been unchanged. The pain is located in the left unilateral and occipital region. The pain does not radiate. The pain quality is similar to prior headaches. The pain is at a severity of 10/10. Associated symptoms include nausea. Pertinent negatives include no fever or vomiting. The symptoms are aggravated by bright light. Treatments tried: Fiorcet on Friday and Sunday  The treatment provided no relief.   Past Medical History:  Diagnosis Date  . Anxiety   . Chiari malformation type I ([place])    MRI [facility] 2013  . Depression    PPD with 2nd baby  . Endometriosis 1997  . Headache   . Headache in pregnancy   . Hearing loss   . Kidney infection   . Kidney stones   . Otosclerosis 2008  . Otosclerosis     Past Surgical History:  Procedure Laterality Date  . DILATION AND CURETTAGE OF UTERUS  May 2015  . LAPAROSCOPIC ABDOMINAL EXPLORATION  06/2013   has had 4 surgeries  . SHOULDER SURGERY  2000  . TONSILLECTOMY AND ADENOIDECTOMY  2010  . WISDOM TOOTH EXTRACTION  2008    Family History  Problem Relation Age of Onset  . Asthma Father   . Arthritis Father        Rheumatoid  . Cancer Maternal Grandmother        skin  . Hypertension Other   . Hyperlipidemia Other   . Stroke Other   . Obesity Other   . Aneurysm Paternal Grandfather        brain  . Skin cancer Paternal Grandmother   . Migraines Neg Hx     Social History    Tobacco Use  . Smoking status: Former Smoker    Types: Cigarettes    Last attempt to quit: 11/25/2001    Years since quitting: 15.5  . Smokeless tobacco: Never Used  Substance Use Topics  . Alcohol use: Not Currently    Alcohol/week: 0.6 oz    Types: 1 Glasses of wine per week    Comment: social events  . Drug use: No    Allergies:  Allergies  Allergen Reactions  . Shellfish Allergy Hives  . Verapamil     GI sx, weakness  . Other     Surgical glue causes a rash  . Sulfa Antibiotics Dermatitis and Rash    Medications Prior to Admission  Medication Sig Dispense Refill Last Dose  . acetaminophen (TYLENOL) 325 MG tablet Take 2 tablets (650 mg total) by mouth every 4 (four) hours as needed (for pain scale < 4  OR  temperature  >/=  100.5 F). 60 tablet 0   . colestipol (COLESTID) 1 g tablet Take 2 tablets (2 g total) by mouth 2 (two) times daily. 28 tablet 1   . dicyclomine (BENTYL) 10 MG capsule Take 2 capsules (20 mg total) by mouth 3 (three)  times daily before meals. 42 capsule 0   . Prenat w/o A-[id number] (CITRANATAL ASSURE PO) Take 1 tablet by mouth daily.   04/25/2017 at Unknown time  . promethazine (PHENERGAN) 12.5 MG tablet Take 1 tablet (12.5 mg total) by mouth every 6 (six) hours as needed for nausea or vomiting. 30 tablet 0   . sertraline (ZOLOFT) 100 MG tablet Take 100 mg by mouth daily.   04/25/2017 at Unknown time    Review of Systems  Constitutional: Negative for chills and fever.  Gastrointestinal: Positive for nausea. Negative for vomiting.  Neurological: Positive for headaches.   Physical Exam   Blood pressure 123/60, pulse 66, temperature 98.4 F (36.9 C), temperature source Oral, resp. rate 18, height 5\' 9"  (1.753 m), weight 181 lb 14.4 oz (82.5 kg), last menstrual period 09/05/2016, unknown if currently breastfeeding.  Physical Exam  Nursing note and vitals reviewed. Constitutional: She is oriented to person, place, and time. She appears  well-developed and well-nourished. No distress.  HENT:  Head: Normocephalic.  Cardiovascular: Normal rate.  Respiratory: Effort normal.  GI: Soft. There is no tenderness. There is no rebound.  Neurological: She is alert and oriented to person, place, and time.  Skin: Skin is warm and dry.  Psychiatric: She has a normal mood and affect.  FHT: 142 with doppler  MAU Course  Procedures  MDM Patient has had Decadron, benadryl and phenergan. Unsure if all the phenergan was given as IV infiltrated just as phenergan was almost finished being given.  1602: Patient reports that her pain is now 6/10 (from 10/10) 1608; DW Dr. Julien Girt ok to give toradol at this [place]. The patient needs to FU with neurology.   Assessment and Plan   1. Migraine without status migrainosus, not intractable, unspecified migraine type   2. [redacted] weeks gestation of pregnancy    DC home Comfort measures reviewed  2nd/3rd Trimester precautions  PTL precautions  Fetal kick counts RX: no new rx provided  Return to MAU as needed FU with OB as planned  Follow-up Information    [facility] Follow up.   Contact information: [address]     [address]  [place] [address] [phone]       Marylynn Pearson, MD Follow up.   Specialty:  Obstetrics and Gynecology Contact information: [place], [place] [address] [phone]            Marcille Buffy 06/22/2017, 12:44 PM

## 2017-06-22 NOTE — Discharge Instructions (Signed)
Migraine Headache A migraine headache is an intense, throbbing pain on one side or both sides of the head. Migraines may also cause other symptoms, such as nausea, vomiting, and sensitivity to light and noise. What are the causes? Doing or taking certain things may also trigger migraines, such as:  Alcohol.  Smoking.  Medicines, such as: ? Medicine used to treat chest pain (nitroglycerine). ? Birth control pills. ? Estrogen pills. ? Certain blood pressure medicines.  Aged cheeses, chocolate, or caffeine.  Foods or drinks that contain nitrates, glutamate, aspartame, or tyramine.  Physical activity.  Other things that may trigger a migraine include:  Menstruation.  Pregnancy.  Hunger.  Stress, lack of sleep, too much sleep, or fatigue.  Weather changes.  What increases the risk? The following factors may make you more likely to experience migraine headaches:  Age. Risk increases with age.  Family history of migraine headaches.  Being Caucasian.  Depression and anxiety.  Obesity.  Being a woman.  Having a hole in the heart (patent foramen ovale) or other heart problems.  What are the signs or symptoms? The main symptom of this condition is pulsating or throbbing pain. Pain may:  Happen in any area of the head, such as on one side or both sides.  Interfere with daily activities.  Get worse with physical activity.  Get worse with exposure to bright lights or loud noises.  Other symptoms may include:  Nausea.  Vomiting.  Dizziness.  General sensitivity to bright lights, loud noises, or smells.  Before you get a migraine, you may get warning signs that a migraine is developing (aura). An aura may include:  Seeing flashing lights or having blind spots.  Seeing bright spots, halos, or zigzag lines.  Having tunnel vision or blurred vision.  Having numbness or a tingling feeling.  Having trouble talking.  Having muscle weakness.  How is this  diagnosed? A migraine headache can be diagnosed based on:  Your symptoms.  A physical exam.  Tests, such as CT scan or MRI of the head. These imaging tests can help rule out other causes of headaches.  Taking fluid from the spine (lumbar puncture) and analyzing it (cerebrospinal fluid analysis, or CSF analysis).  How is this treated? A migraine headache is usually treated with medicines that:  Relieve pain.  Relieve nausea.  Prevent migraines from coming back.  Treatment may also include:  Acupuncture.  Lifestyle changes like avoiding foods that trigger migraines.  Follow these instructions at home: Medicines  Take over-the-counter and prescription medicines only as told by your health care provider.  Do not drive or use heavy machinery while taking prescription pain medicine.  To prevent or treat constipation while you are taking prescription pain medicine, your health care provider may recommend that you: ? Drink enough fluid to keep your urine clear or pale yellow. ? Take over-the-counter or prescription medicines. ? Eat foods that are high in fiber, such as fresh fruits and vegetables, whole grains, and beans. ? Limit foods that are high in fat and processed sugars, such as fried and sweet foods. Lifestyle  Avoid alcohol use.  Do not use any products that contain nicotine or tobacco, such as cigarettes and e-cigarettes. If you need help quitting, ask your health care provider.  Get at least 8 hours of sleep every night.  Limit your stress. General instructions   Keep a journal to find out what may trigger your migraine headaches. For example, write down: ? What you eat and   drink. ? How much sleep you get. ? Any change to your diet or medicines.  If you have a migraine: ? Avoid things that make your symptoms worse, such as bright lights. ? It may help to lie down in a dark, quiet room. ? Do not drive or use heavy machinery. ? Ask your health care provider  what activities are safe for you while you are experiencing symptoms.  Keep all follow-up visits as told by your health care provider. This is important. Contact a health care provider if:  You develop symptoms that are different or more severe than your usual migraine symptoms. Get help right away if:  Your migraine becomes severe.  You have a fever.  You have a stiff neck.  You have vision loss.  Your muscles feel weak or like you cannot control them.  You start to lose your balance often.  You develop trouble walking.  You faint. This information is not intended to replace advice given to you by your health care provider. Make sure you discuss any questions you have with your health care provider. Document Released: 02/02/2005 Document Revised: 08/23/2015 Document Reviewed: 07/22/2015 Elsevier Interactive Patient Education  2017 Elsevier Inc.   

## 2017-06-22 NOTE — MAU Note (Signed)
On Day 5 of a migraine- has tried Fioricet on Friday and Saturday.  Nausea for the past 3 weeks, has been taking zofran. Helps a little bit.

## 2017-06-28 DIAGNOSIS — Z362 Encounter for other antenatal screening follow-up: Secondary | ICD-10-CM | POA: Diagnosis not present

## 2017-06-28 DIAGNOSIS — Z3A23 23 weeks gestation of pregnancy: Secondary | ICD-10-CM | POA: Diagnosis not present

## 2017-07-10 ENCOUNTER — Other Ambulatory Visit: Payer: Self-pay | Admitting: Nurse Practitioner

## 2017-07-13 NOTE — Telephone Encounter (Signed)
Refilled x 90 day for insurance, 0 refills. Note to pharmacy:Patient needs FU.

## 2017-07-15 ENCOUNTER — Encounter (HOSPITAL_COMMUNITY): Payer: Self-pay | Admitting: Anesthesiology

## 2017-07-15 ENCOUNTER — Encounter (HOSPITAL_COMMUNITY): Payer: Self-pay | Admitting: *Deleted

## 2017-07-15 ENCOUNTER — Inpatient Hospital Stay (HOSPITAL_COMMUNITY)
Admission: AD | Admit: 2017-07-15 | Discharge: 2017-07-19 | DRG: 833 | Disposition: A | Discharge status: Other Acute Inpatient Hospital | Payer: BLUE CROSS/BLUE SHIELD | Attending: Obstetrics and Gynecology | Admitting: Obstetrics and Gynecology

## 2017-07-15 ENCOUNTER — Other Ambulatory Visit: Payer: Self-pay

## 2017-07-15 DIAGNOSIS — F419 Anxiety disorder, unspecified: Secondary | ICD-10-CM | POA: Diagnosis not present

## 2017-07-15 DIAGNOSIS — R110 Nausea: Secondary | ICD-10-CM | POA: Diagnosis not present

## 2017-07-15 DIAGNOSIS — O99342 Other mental disorders complicating pregnancy, second trimester: Secondary | ICD-10-CM | POA: Diagnosis present

## 2017-07-15 DIAGNOSIS — O09522 Supervision of elderly multigravida, second trimester: Secondary | ICD-10-CM

## 2017-07-15 DIAGNOSIS — K30 Functional dyspepsia: Secondary | ICD-10-CM | POA: Diagnosis not present

## 2017-07-15 DIAGNOSIS — R159 Full incontinence of feces: Secondary | ICD-10-CM | POA: Diagnosis not present

## 2017-07-15 DIAGNOSIS — Z3A24 24 weeks gestation of pregnancy: Secondary | ICD-10-CM

## 2017-07-15 DIAGNOSIS — N132 Hydronephrosis with renal and ureteral calculous obstruction: Secondary | ICD-10-CM | POA: Diagnosis not present

## 2017-07-15 DIAGNOSIS — R1011 Right upper quadrant pain: Secondary | ICD-10-CM | POA: Diagnosis not present

## 2017-07-15 DIAGNOSIS — R1013 Epigastric pain: Secondary | ICD-10-CM | POA: Diagnosis present

## 2017-07-15 DIAGNOSIS — N200 Calculus of kidney: Secondary | ICD-10-CM | POA: Diagnosis not present

## 2017-07-15 DIAGNOSIS — H1089 Other conjunctivitis: Secondary | ICD-10-CM | POA: Diagnosis not present

## 2017-07-15 DIAGNOSIS — O9989 Other specified diseases and conditions complicating pregnancy, childbirth and the puerperium: Secondary | ICD-10-CM | POA: Diagnosis not present

## 2017-07-15 DIAGNOSIS — Z3A25 25 weeks gestation of pregnancy: Secondary | ICD-10-CM | POA: Diagnosis not present

## 2017-07-15 DIAGNOSIS — O26892 Other specified pregnancy related conditions, second trimester: Secondary | ICD-10-CM | POA: Diagnosis not present

## 2017-07-15 DIAGNOSIS — F329 Major depressive disorder, single episode, unspecified: Secondary | ICD-10-CM | POA: Diagnosis present

## 2017-07-15 DIAGNOSIS — Z363 Encounter for antenatal screening for malformations: Secondary | ICD-10-CM | POA: Diagnosis not present

## 2017-07-15 DIAGNOSIS — G43109 Migraine with aura, not intractable, without status migrainosus: Secondary | ICD-10-CM | POA: Diagnosis not present

## 2017-07-15 DIAGNOSIS — Z3A26 26 weeks gestation of pregnancy: Secondary | ICD-10-CM | POA: Diagnosis not present

## 2017-07-15 DIAGNOSIS — R630 Anorexia: Secondary | ICD-10-CM | POA: Diagnosis present

## 2017-07-15 DIAGNOSIS — O99352 Diseases of the nervous system complicating pregnancy, second trimester: Secondary | ICD-10-CM | POA: Diagnosis not present

## 2017-07-15 DIAGNOSIS — O26899 Other specified pregnancy related conditions, unspecified trimester: Secondary | ICD-10-CM | POA: Diagnosis not present

## 2017-07-15 DIAGNOSIS — N393 Stress incontinence (female) (male): Secondary | ICD-10-CM | POA: Diagnosis not present

## 2017-07-15 DIAGNOSIS — N1330 Unspecified hydronephrosis: Secondary | ICD-10-CM | POA: Diagnosis not present

## 2017-07-15 DIAGNOSIS — O99612 Diseases of the digestive system complicating pregnancy, second trimester: Secondary | ICD-10-CM | POA: Diagnosis not present

## 2017-07-15 DIAGNOSIS — R109 Unspecified abdominal pain: Secondary | ICD-10-CM

## 2017-07-15 LAB — TSH: TSH: 1.456 u[IU]/mL (ref 0.350–4.500)

## 2017-07-15 LAB — CBC
HEMATOCRIT: 36 % (ref 36.0–46.0)
HEMOGLOBIN: 12.1 g/dL (ref 12.0–15.0)
MCH: 31.2 pg (ref 26.0–34.0)
MCHC: 33.6 g/dL (ref 30.0–36.0)
MCV: 92.8 fL (ref 78.0–100.0)
Platelets: 196 10*3/uL (ref 150–400)
RBC: 3.88 MIL/uL (ref 3.87–5.11)
RDW: 13.1 % (ref 11.5–15.5)
WBC: 9 10*3/uL (ref 4.0–10.5)

## 2017-07-15 LAB — URINALYSIS, ROUTINE W REFLEX MICROSCOPIC
Bilirubin Urine: NEGATIVE
GLUCOSE, UA: NEGATIVE mg/dL
Hgb urine dipstick: NEGATIVE
KETONES UR: NEGATIVE mg/dL
LEUKOCYTES UA: NEGATIVE
Nitrite: NEGATIVE
PH: 8 (ref 5.0–8.0)
Protein, ur: NEGATIVE mg/dL
Specific Gravity, Urine: 1.003 — ABNORMAL LOW (ref 1.005–1.030)

## 2017-07-15 LAB — COMPREHENSIVE METABOLIC PANEL
ALT: 9 U/L — AB (ref 14–54)
ANION GAP: 9 (ref 5–15)
AST: 16 U/L (ref 15–41)
Albumin: 3.5 g/dL (ref 3.5–5.0)
Alkaline Phosphatase: 76 U/L (ref 38–126)
BUN: 6 mg/dL (ref 6–20)
CHLORIDE: 106 mmol/L (ref 101–111)
CO2: 22 mmol/L (ref 22–32)
CREATININE: 0.63 mg/dL (ref 0.44–1.00)
Calcium: 8.8 mg/dL — ABNORMAL LOW (ref 8.9–10.3)
Glucose, Bld: 80 mg/dL (ref 65–99)
POTASSIUM: 4 mmol/L (ref 3.5–5.1)
SODIUM: 137 mmol/L (ref 135–145)
Total Bilirubin: 0.3 mg/dL (ref 0.3–1.2)
Total Protein: 6.3 g/dL — ABNORMAL LOW (ref 6.5–8.1)

## 2017-07-15 MED ORDER — ZOLPIDEM TARTRATE 5 MG PO TABS
5.0000 mg | ORAL_TABLET | Freq: Every evening | ORAL | Status: DC | PRN
  Administered 2017-07-15 – 2017-07-18 (×4): 5 mg via ORAL
  Filled 2017-07-15 (×4): qty 1

## 2017-07-15 MED ORDER — ONDANSETRON 4 MG PO TBDP
4.0000 mg | ORAL_TABLET | Freq: Four times a day (QID) | ORAL | Status: DC | PRN
  Filled 2017-07-15: qty 1

## 2017-07-15 MED ORDER — CALCIUM CARBONATE ANTACID 500 MG PO CHEW: 2.0000 | CHEWABLE_TABLET | ORAL | Status: DC | PRN

## 2017-07-15 MED ORDER — BUTALBITAL-APAP-CAFFEINE 50-325-40 MG PO TABS: 1.0000 | ORAL_TABLET | Freq: Four times a day (QID) | ORAL | Status: DC | PRN

## 2017-07-15 MED ORDER — LACTATED RINGERS IV SOLN
INTRAVENOUS | Status: DC
  Administered 2017-07-15 – 2017-07-19 (×12): via INTRAVENOUS

## 2017-07-15 MED ORDER — FAMOTIDINE IN NACL 20-0.9 MG/50ML-% IV SOLN
20.0000 mg | INTRAVENOUS | Status: DC
  Administered 2017-07-15: 20 mg via INTRAVENOUS
  Filled 2017-07-15: qty 50

## 2017-07-15 MED ORDER — DOCUSATE SODIUM 100 MG PO CAPS
100.0000 mg | ORAL_CAPSULE | Freq: Every day | ORAL | Status: DC
  Filled 2017-07-15: qty 1

## 2017-07-15 MED ORDER — PRENATAL MULTIVITAMIN CH
1.0000 | ORAL_TABLET | Freq: Every day | ORAL | Status: DC
  Administered 2017-07-16 – 2017-07-19 (×4): 1 via ORAL
  Filled 2017-07-15 (×4): qty 1

## 2017-07-15 MED ORDER — LACTATED RINGERS IV BOLUS
250.0000 mL | Freq: Once | INTRAVENOUS | Status: AC
  Administered 2017-07-15: 250 mL via INTRAVENOUS

## 2017-07-15 MED ORDER — ACETAMINOPHEN 325 MG PO TABS
650.0000 mg | ORAL_TABLET | ORAL | Status: DC | PRN
Start: 2017-07-15 — End: 2017-07-20
  Administered 2017-07-17 – 2017-07-18 (×4): 650 mg via ORAL
  Filled 2017-07-15 (×4): qty 2

## 2017-07-15 NOTE — H&P (Signed)
Ellen Vasquez is a 42 y.o. female presenting for evaluation of persistent anorexia, nausea and right upper quadrant burning. Pregnancy complicated by HA partially responsive to [facility] consult done, SOB in first trimester with normal CXR in Feb, 2019>cardiology consult done, swelling and pain in LE>normal doppler negative for DVT in Feb, 2019, Hx of kidney stones. Admitted in March 2019 with diarrhea>E coli gastroenteritis. Also anxiety currently on sertraline. Has persistent lack of appetite. Can force herself to take liquids. However, may go 1-3 days with much calorie intake and only tolerates that if she first takes Zofran. Also has burning type pain at right costal margin.  OB History    Gravida  4   Para  3   Term  3   Preterm      AB      Living  3     SAB      TAB      Ectopic      Multiple  0   Live Births  3          Past Medical History:  Diagnosis Date  . Anxiety   . Chiari malformation type I ([place])    MRI [facility] 2013  . Depression    PPD with 2nd baby  . Endometriosis 1997  . Headache   . Headache in pregnancy   . Hearing loss   . Kidney infection   . Kidney stones   . Otosclerosis 2008  . Otosclerosis    Past Surgical History:  Procedure Laterality Date  . DILATION AND CURETTAGE OF UTERUS  May 2015  . LAPAROSCOPIC ABDOMINAL EXPLORATION  06/2013   has had 4 surgeries  . SHOULDER SURGERY  2000  . TONSILLECTOMY AND ADENOIDECTOMY  2010  . WISDOM TOOTH EXTRACTION  2008   Family History: family history includes Aneurysm in her paternal grandfather; Arthritis in her father; Asthma in her father; Cancer in her maternal grandmother; Hyperlipidemia in her other; Hypertension in her other; Obesity in her other; Skin cancer in her paternal grandmother; Stroke in her other. Social History:  reports that she quit smoking about 15 years ago. Her smoking use included cigarettes. She has never used smokeless tobacco. She reports that she drank about  0.6 oz of alcohol per week. She reports that she does not use drugs.     Maternal Diabetes: No Genetic Screening: Normal Maternal Ultrasounds/Referrals: Normal Fetal Ultrasounds or other Referrals:  Other: see above Maternal Substance Abuse:  No Significant Maternal Medications:  Meds include: Other: sertraline, zofran prn, Fioricet prn Significant Maternal Lab Results:  None Other Comments:  see above  Review of Systems  Constitutional: Negative for fever.  Gastrointestinal: Positive for abdominal pain and nausea.  Neurological: Positive for headaches.   Maternal Medical History:  Reason for admission: Nausea.   Prenatal complications: Nephrolithiasis.       Blood pressure (!) 113/58, pulse 75, temperature 98.7 F (37.1 C), temperature source Oral, resp. rate 16, height 5\' 9"  (1.753 m), weight 182 lb 3.2 oz (82.6 kg), last menstrual period 09/05/2016, SpO2 100 %, unknown if currently breastfeeding. Maternal Exam:  Abdomen: Patient reports the following abdominal tenderness: RUQ.    Physical Exam  Cardiovascular: Normal rate and regular rhythm.  Respiratory: Effort normal and breath sounds normal.  GI: Soft. There is tenderness in the right upper quadrant.  Mild tenderness at right costal margin No rebound Uterus soft and NT  Skin: Skin is warm and dry.    Prenatal labs: ABO, Rh: --/--/  B POS (03/11 0551) Antibody: NEG (03/11 0551) Rubella:   RPR:    HBsAg:    HIV:    GBS:     U/S 04/21/17 >IMPRESSION: Normal gallbladder. Right upper quadrant ultrasound is within normal Limits.  Assessment/Plan: 42 yo G4P3 @ 25 3/7 weeks  Persistent anorexia and RUQ burning Anxiety Will begin IV fluids, check labs GI consult MFM consult Dietitian to assess nutritional status   Shon Millet II 07/15/2017, 6:00 PM

## 2017-07-16 ENCOUNTER — Observation Stay (HOSPITAL_COMMUNITY): Payer: BLUE CROSS/BLUE SHIELD

## 2017-07-16 DIAGNOSIS — R1011 Right upper quadrant pain: Secondary | ICD-10-CM

## 2017-07-16 DIAGNOSIS — Z3A24 24 weeks gestation of pregnancy: Secondary | ICD-10-CM | POA: Diagnosis not present

## 2017-07-16 DIAGNOSIS — N1330 Unspecified hydronephrosis: Secondary | ICD-10-CM | POA: Diagnosis not present

## 2017-07-16 DIAGNOSIS — R110 Nausea: Secondary | ICD-10-CM

## 2017-07-16 DIAGNOSIS — R630 Anorexia: Secondary | ICD-10-CM

## 2017-07-16 DIAGNOSIS — Z363 Encounter for antenatal screening for malformations: Secondary | ICD-10-CM | POA: Diagnosis not present

## 2017-07-16 LAB — TYPE AND SCREEN
ABO/RH(D): B POS
ANTIBODY SCREEN: NEGATIVE

## 2017-07-16 MED ORDER — BOOST / RESOURCE BREEZE PO LIQD CUSTOM
1.0000 | Freq: Three times a day (TID) | ORAL | Status: DC
  Administered 2017-07-16: 1 via ORAL
  Filled 2017-07-16 (×5): qty 1

## 2017-07-16 MED ORDER — GUAIFENESIN ER 600 MG PO TB12
600.0000 mg | ORAL_TABLET | Freq: Two times a day (BID) | ORAL | Status: DC
  Administered 2017-07-16 – 2017-07-19 (×7): 600 mg via ORAL
  Filled 2017-07-16 (×9): qty 1

## 2017-07-16 MED ORDER — FAMOTIDINE IN NACL 20-0.9 MG/50ML-% IV SOLN
20.0000 mg | Freq: Two times a day (BID) | INTRAVENOUS | Status: DC
  Administered 2017-07-16 – 2017-07-19 (×6): 20 mg via INTRAVENOUS
  Filled 2017-07-16 (×7): qty 50

## 2017-07-16 MED ORDER — ONDANSETRON HCL 4 MG PO TABS
4.0000 mg | ORAL_TABLET | Freq: Four times a day (QID) | ORAL | Status: DC
  Administered 2017-07-16 – 2017-07-19 (×13): 4 mg via ORAL
  Filled 2017-07-16 (×12): qty 1

## 2017-07-16 MED ORDER — ENSURE ENLIVE PO LIQD
237.0000 mL | Freq: Two times a day (BID) | ORAL | Status: DC
  Administered 2017-07-16 – 2017-07-17 (×2): 237 mL via ORAL
  Filled 2017-07-16 (×4): qty 237

## 2017-07-16 MED ORDER — SERTRALINE HCL 50 MG PO TABS
150.0000 mg | ORAL_TABLET | Freq: Every day | ORAL | Status: DC
  Administered 2017-07-16 – 2017-07-18 (×3): 150 mg via ORAL
  Filled 2017-07-16 (×4): qty 1

## 2017-07-16 NOTE — Progress Notes (Signed)
25 4/7 weeks  Feels same-no appetite, vague epigastric discomfort C/O nasal congestion today Had loose stool in bed early this am-first diarrhea stool in 2 weeks  Vitals:   07/16/17 0502 07/16/17 0804  BP: (!) 110/58 (!) 114/52  Pulse: 70 71  Resp: 16 16  Temp: 97.7 F (36.5 C)   SpO2: 100%    NST +accels  U/S done this am  A/P: anorexia/epigastric pain/nausea         Diarrhea         Sinus congestion         Depression           GI consult, Nutrition consult          Mucinex prn

## 2017-07-16 NOTE — Progress Notes (Signed)
Initial Nutrition Assessment  DOCUMENTATION CODES:   Not applicable  INTERVENTION:  Regular diet : pt to eat small freq meals of bland low fat foods that she thinks she can tolerate ( discussed options ) Trial of Ensure Enlive and Boost Breeze  To see if either one is tolerated well Promote fluid intake now as that is what is tol best  NUTRITION DIAGNOSIS:   Inadequate oral intake related to poor appetite, nausea as evidenced by per patient/family report.  GOAL:  Patient will meet greater than or equal to 90% of their needs, Weight gain  MONITOR:  Weight trends, PO intake  REASON FOR ASSESSMENT:  Consult Poor PO  ASSESSMENT:   25 4/7 weeks adm with poor appetite nausea abd pain. Pre-preg weight 172 lbs, BMI 25.4. Overall 10 lb weight gain, but no weight gain since 06/14/17. For the past month has had poor appetite,. Able to eat 2 meals one day and then no po intake for the next 2-3 days. Typically is constipated with no stool for days . Diarrhea last night. Does much better with liquids, white bland starchy foods and some fruit. Proteins are not tolerated . Usually can tolerate carnation instant breakfast. This intake pattern puts pt at high risk for malnutrition    Diet Order:   Diet Order           Diet regular Room service appropriate? Yes; Fluid consistency: Thin  Diet effective ____        Diet NPO time specified  Diet effective now          EDUCATION NEEDS:   No education needs have been identified at this time  Skin:  Skin Assessment: Reviewed RN Assessment  Last BM:  5/31  Height:   Ht Readings from Last 1 Encounters:  07/15/17 5\' 9"  (1.753 m)    Weight:   Wt Readings from Last 1 Encounters:  07/15/17 182 lb 3.2 oz (82.6 kg)    Ideal Body Weight:     BMI:  Body mass index is 26.91 kg/m.  Estimated Nutritional Needs:   Kcal:  2000-2200  Protein:  90-100 g  Fluid:  2.3 L    Weyman Rodney M.Fredderick Severance LDN Neonatal Nutrition Support  Specialist/RD III Pager [phone]      Phone [phone]

## 2017-07-16 NOTE — Progress Notes (Signed)
Pt in wc to MFM for [place]

## 2017-07-16 NOTE — Consult Note (Signed)
Maternal Fetal Medicine Consultation  I had the pleasure of seeing your patient Ellen Vasquez for Maternal-Fetal Medicine consultation on 07/15/2017. As you know, Ellen Vasquez is a 42 y.o. G4P3003 at 109w4d who presents for consultation regarding anoxeria, right upper quadrant pain, and inability to tolerate solid food following an E. Coli infection in March.  Ellen Vasquez was admitted to [facility] in March of this year for an enteropathogenic E. Coli infection. She initially improved, but since that time has had persistent but fluctuating nausea, anorexia, inability to tolerate solids, and intermittent diarrhea. She denies hematemesis, mucus or blood in her stool. She is able to tolerate liquids. She has never experienced these symptoms in previous pregnancies or prior to March.   Ellen Vasquez's obstetric history is notable for three term vaginal deliveries. This pregnancy was uncomplicated until admission for E. Coli in March. She was followed by GI and treated with azithromycin. C. Diff evaluation was negative. She was additionally treated with fiber, cholestyramine, phenergan, and bentyl.   Ellen Vasquez presented to the [facility]U last night for worsening anorexia and RUQ pain. This morning she was seen by nutrition who recommended Ensure and GI, attending recommendations pending. Her blood pressures have been within normal limits. Laboratory evaluation notable for normal creatinine, LFTs, bilirubin, CBC, TSH. Negative U/A. Obstetric ultrasound showed a well grown fetus in the 75%ile. RUQ ultrasound did not show any signs of cholecystitis.   Ellen Vasquez reports occasional contractions, but denies loss of fluid, or vaginal bleeding. Fetal movement is active.   The remainder of Ellen Vasquez's medical history is notable for kidney stones, type I Chiari malformation, migraines, and a benign liver hemangioma. Her past surgical history is significant for diagnosis laparoscopy x 4 for endometriosis. She takes prenatal vitamins, zoloft, zofran,  phenergan, zantac, prenatal vitamins and is allergic to sulfa drugs and verapamil. Ellen Vasquez denies alcohol, tobacco or other drug use. Her family history is noncontributory.  Impression: I do not believe Ellen Vasquez's currently clinical presentation is consistent with any pregnancy-specific pathology. She has no objective evidence of hypertensive disorders of pregnancy or pregnancy-specific liver or GI pathology. Given the inciting event for her symptoms was the episode of E. Coli infection in March her current symptoms seem likely to be sequelae from either that infection or the treatment she received. Given her regular bowel movements obstruction is very unlikely.  I do not believe delivery would improve her symptoms. The fetus is currently well grown and she seems likely to be able to tolerate protein shakes and can tolerate liquids to achieve adequate nutritional intake. Defer to GI for additional evaluation and management. We did discuss that if they believe endoscopy or colonoscopy is indicated these may be safely performed in pregnancy. Even if no specific cause is identified I would recommend continued supportive management as needed with nutrition and IV fluid supplementation, antiemetics as needed.   Recommendations: 1. Continued input from GI and nutrition  2. Serial ultrasounds for fetal growth assessment 3. Delivery timing based on obstetric indications; could consider early term delivery 37-39 weeks if there is reason to believe delivery would improve maternal status, can assess further along in pregnancy  Impression and recommendations discussed with primary OB Dr. Gaetano Net.  Thank you for the opportunity to be a part of the care of [employer]. Please contact our office if we can be of further assistance.   I spent approximately 40 minutes with this patient with over 50% of time spent in face-to-face counseling.  Abram Sander, MD Maternal-Fetal Medicine

## 2017-07-16 NOTE — Consult Note (Addendum)
[facility] Consult: 9:14 AM 07/16/2017  LOS: 1 day    Referring Provider: Dr Gaetano Net  Primary Care Physician:  Aretta Nip, MD Primary Gastroenterologist:  Dr. Henrene Pastor    Reason for Consultation:  Nausea, vomiting, upper abd pain.     HPI: Ellen VASKO is a 42 y.o. female. G4P3 currently pregnant at 25 4/7 weeks.  PMH endometriosis.  Non-obstructing renal stones.  Type 1 Chiari malformation.  Headaches.  Anxiety.   Indeterminate liver lesion on CT 2010.  MRI revealed this as benign hemangioma.   S/p laparoscopic hysteroscopy and D&C.  S/p laparoscopic abdominal/pelvic exploration x 4, latest May 2015.    Seen by GI as inpt during 2 back to back admissions in early 04/2017.  C/o diarrhea, n/v, upper abdominal pain.   04/21/17 abdominal ultrasound: entirely normal.   Diagnosed with enteropathogenic E coli infection.  Treated with fiber, cholestyramine, phenergan, Bentyl.   She says for about 2 weeks following discharge she was back to normal.  She was not having diarrhea, upper abdominal pain, nausea or vomiting.  She felt well so she canceled GI office appointment that had been set up for 3/21. However after about 2 weeks she developed anorexia, nausea and recurrent burning quality pain in the right upper quadrant just underneath the lower rib.  She has absolutely no appetite and has to force herself to eat.  In order to avoid worsening her nausea and in order to tolerate food, she uses Zofran and sometimes Phenergan before eating.  The degree to which she is nauseated as well as p.o. intake does not exacerbate or correlate with her upper abdominal burning .  A couple of weeks ago, along with increased symptoms, she had diarrhea but this resolved.  She had diarrheal stools last night. Since discharge  Zantac was added to her medical regimen and she takes Zantac 75 mg every evening along with her Zoloft.  Weight up 14# since mid 04/2017.  Labs are unremarkable.  She is not anemic, has no white blood cell count elevation.  LFTs and chemistries/renal function normal.  TSH normal.  Unremarkable urinalysis. Fortunately gyn ultrasound is showing appropriate fetal growth.    MD has added IV Pepcid.      Past Medical History:  Diagnosis Date  . Anxiety   . Chiari malformation type I ([place])    MRI [facility] 2013  . Depression    PPD with 2nd baby  . Endometriosis 1997  . Headache   . Headache in pregnancy   . Hearing loss   . Kidney infection   . Kidney stones   . Otosclerosis 2008  . Otosclerosis     Past Surgical History:  Procedure Laterality Date  . DILATION AND CURETTAGE OF UTERUS  May 2015  . LAPAROSCOPIC ABDOMINAL EXPLORATION  06/2013   has had 4 surgeries  . SHOULDER SURGERY  2000  . TONSILLECTOMY AND ADENOIDECTOMY  2010  . [place] EXTRACTION  2008    Prior to Admission medications   Medication Sig Start Date End Date Taking? Authorizing  Provider  butalbital-acetaminophen-caffeine (FIORICET, ESGIC) 50-325-40 MG tablet Take 1 tablet by mouth 2 (two) times daily as needed for headache or migraine.    Yes [provider]  ondansetron (ZOFRAN-ODT) 4 MG disintegrating tablet Take 4 mg by mouth every 6 (six) hours as needed for nausea or vomiting.  06/10/17  Yes [provider]  Prenat w/o A-[id number]-[id number]-FA-DHA (CITRANATAL ASSURE PO) Take 1 tablet by mouth daily.   Yes [provider]  promethazine (PHENERGAN) 25 MG tablet Take 25 mg by mouth every 6 (six) hours as needed for nausea or vomiting.   Yes [provider]  ranitidine (ZANTAC) 75 MG tablet Take 75 mg by mouth 2 (two) times daily as needed for heartburn.   Yes [provider]  sertraline (ZOLOFT) 100 MG tablet Take 150 mg by mouth at bedtime.    Yes [provider]    acetaminophen (TYLENOL) 325 MG tablet Take 2 tablets (650 mg total) by mouth every 4 (four) hours as needed (for pain scale < 4  OR  temperature  >/=  100.5 F). 04/30/17   Everlene Farrier, MD    Scheduled Meds: . docusate sodium  100 mg Oral Daily  . guaiFENesin  600 mg Oral BID  . prenatal multivitamin  1 tablet Oral Q1200  . sertraline  150 mg Oral QHS   Infusions: . famotidine (PEPCID) IV Stopped (07/15/17 1928)  . lactated ringers 125 mL/hr at 07/16/17 0754   PRN Meds: acetaminophen, butalbital-acetaminophen-caffeine, calcium carbonate, ondansetron, zolpidem   Allergies as of 07/15/2017 - Review Complete 07/15/2017  Allergen Reaction Noted  . Shellfish allergy Hives 01/04/2013  . Verapamil  03/13/2015  . Other  06/12/2014  . Sulfa antibiotics Dermatitis and Rash 06/12/2014    Family History  Problem Relation Age of Onset  . Asthma Father   . Arthritis Father        Rheumatoid  . Cancer Maternal Grandmother        skin  . Hypertension Other   . Hyperlipidemia Other   . Stroke Other   . Obesity Other   . Aneurysm Paternal Grandfather        brain  . Skin cancer Paternal Grandmother   . Migraines Neg Hx     Social History   Socioeconomic History  . Marital status: Married    Spouse name: Not on file  . Number of children: 2  . Years of education: Phd ED  . Highest education level: Not on file  Occupational History  . Occupation: Professor    Comment: [place]  . Financial resource strain: Not on file  . Food insecurity:    Worry: Not on file    Inability: Not on file  . Transportation needs:    Medical: Not on file    Non-medical: Not on file  Tobacco Use  . Smoking status: Former Smoker    Types: Cigarettes    Last attempt to quit: 11/25/2001    Years since quitting: 15.6  . Smokeless tobacco: Never Used  Substance and Sexual Activity  . Alcohol use: Not Currently    Alcohol/week: 0.6 oz    Types: 1 Glasses of wine per week     Comment: social events  . Drug use: No  . Sexual activity: Yes    Birth control/protection: None  Lifestyle  . Physical activity:    Days per week: Not on file    Minutes per session: Not on file  . Stress: Not  on file  Relationships  . Social connections:    Talks on phone: Not on file    Gets together: Not on file    Attends religious service: Not on file    Active member of club or organization: Not on file    Attends meetings of clubs or organizations: Not on file    Relationship status: Not on file  . Intimate partner violence:    Fear of current or ex partner: Not on file    Emotionally abused: Not on file    Physically abused: Not on file    Forced sexual activity: Not on file  Other Topics Concern  . Not on file  Social History Narrative   Caffeine none (occasional soda).    REVIEW OF SYSTEMS: Constitutional: No profound weakness or fatigue although she does feel tired generally. ENT:  Constantly dry mouth.  No nose bleeds Pulm: No trouble breathing, no cough. CV:  No palpitations, no LE edema.  No chest pain. GU:  No hematuria, no frequency GI:  Per HPI Heme: No unusual bleeding or bruising. Transfusions: None Neuro: No dizziness, no presyncope.  Headaches. Derm:  No itching, no rash or sores.  Endocrine:  No sweats or chills.  No polyuria or dysuria Immunization: Did not inquire as to recent or past immunizations.  None are recorded in the epic record. Travel:  None beyond local counties in last few months.    PHYSICAL EXAM: Vital signs in last 24 hours: Vitals:   07/16/17 0502 07/16/17 0804  BP: (!) 110/58 (!) 114/52  Pulse: 70 71  Resp: 16 16  Temp: 97.7 F (36.5 C)   SpO2: 100%    Wt Readings from Last 3 Encounters:  07/15/17 182 lb 3.2 oz (82.6 kg)  06/22/17 181 lb 14.4 oz (82.5 kg)  04/29/17 168 lb (76.2 kg)    General: Pleasant, looks well, comfortable.  Slightly pale Head: No facial asymmetry or swelling.  No signs of head trauma. Eyes:  No scleral icterus.  No conjunctival pallor.  EOMI. Ears: Not hard of hearing Nose: No discharge or congestion Mouth: Oropharynx moist, clear.  Tongue midline.  Good dentition. Neck: No JVD, no masses, no thyromegaly Lungs: Clear bilaterally.  No labored breathing, no cough. Heart: RRR.  No MRG.  S1, S2 present. Abdomen: Soft.  Nontender.  Bowel sounds hypoactive.  Gravid.  No HSM, hernias, bruits..   Rectal: Deferred. Musc/Skeltl: No joint redness, swelling or deformity. Extremities: No CCE. Neurologic: Alert.  Oriented x3.  Good historian.  No limb weakness or tremors. Skin: No rash, no sores, no suspicious lesions. Nodes: No cervical adenopathy. Psych: Pleasant, calm, fluid speech.  Intake/Output from previous day: 05/30 0701 - 05/31 0700 In: [place] [P.O.:600; I.V.:1133] Out: 1900 [Urine:1900] Intake/Output this shift: No intake/output data recorded.  LAB RESULTS: Recent Labs    07/15/17 1819  WBC 9.0  HGB 12.1  HCT 36.0  PLT 196   BMET Lab Results  Component Value Date   NA 137 07/15/2017   NA 134 (L) 04/26/2017   NA 134 (L) 04/23/2017   K 4.0 07/15/2017   K 3.5 04/26/2017   K 3.3 (L) 04/23/2017   CL 106 07/15/2017   CL 108 04/26/2017   CL 109 04/23/2017   CO2 22 07/15/2017   CO2 20 (L) 04/26/2017   CO2 20 (L) 04/23/2017   GLUCOSE 80 07/15/2017   GLUCOSE 85 04/26/2017   GLUCOSE 100 (H) 04/23/2017   BUN 6 07/15/2017   BUN  7 04/26/2017   BUN <5 (L) 04/23/2017   CREATININE 0.63 07/15/2017   CREATININE 0.62 04/26/2017   CREATININE 0.50 04/23/2017   CALCIUM 8.8 (L) 07/15/2017   CALCIUM 8.5 (L) 04/26/2017   CALCIUM 8.5 (L) 04/23/2017   LFT Recent Labs    07/15/17 1819  PROT 6.3*  ALBUMIN 3.5  AST 16  ALT 9*  ALKPHOS 76  BILITOT 0.3   PT/INR No results found for: INR, PROTIME Hepatitis Panel No results for input(s): HEPBSAG, HCVAB, HEPAIGM, HEPBIGM in the last 72 hours. C-Diff No components found for: CDIFF Lipase     Component Value Date/Time    LIPASE 26 04/26/2017 0551    Drugs of Abuse  No results found for: LABOPIA, COCAINSCRNUR, LABBENZ, AMPHETMU, THCU, LABBARB   RADIOLOGY STUDIES: [place] Mfm Ob Comp + 14 Wk.   07/16/2017 Impression  Single living intrauterine pregnancy at 24w 6d.  Cephalic presentation.  Placenta Posterior, above cervical os.  Appropriate fetal growth.  Normal amniotic fluid volume.  The fetal anatomic survey is NOT complete.  Limited views as detailed above  Normal fetal anatomy.  No fetal anomalies or soft markers of aneuploidy seen.  The adnexa appear normal bilaterally without masses.  The cervix measures 4cm on transabdominal imaging without  funneling. --------------------------------------------------------------------------------------------------------------------------------------------               Oralia Rud, MD Electronically Signed Final Report   07/16/2017 08:49 am ----------------------------------------------------------------------   IMPRESSION:   *    Right upper quadrant burning, anorexia, nausea. Normal LFTs.  History E. Coli infection 04/2017. ? GERD.  q 24 hour Pepcid has yet to improve her sxs, but this is not max dosing.   Though LFTs normal, and 04/2017 ultrasound unremarkable, could this be GB disease?  *   G4P3. Currently pregnant at 25 4/7 weeks.  Ob ultrasound reassuring for normal fetal development.    *   Hepatic hemangioma  *  Hx endometriosis.  S/p 4 laparoscopies.  S/p D & C and s/p hysteroscopy.    PLAN:     *  Case d/w Dr Henrene Pastor.  Up Pepcid to 20 mg IV BID.  Scheduled Zofran q 6 hours.  Abdominal ultrasound.  ? Should we trial holding her prenatal vitamin?   Azucena Freed  07/16/2017, 9:14 AM Phone [phone]  GI ATTENDING  History, laboratories, x-rays reviewed. Patient personally seen and examined. Agree with comprehensive consultation note as outlined above. The patient was seen in March for infectious enteritis. Improved slowly but completely. However  over the past 6 weeks she describes problems with nausea, poor appetite, and burning sensation in the right upper quadrant. Occasional pyrosis for which she takes Zantac on demand. Occasional vomiting. No vomiting in 2 weeks. No worrisome obstetric issues identified. Laboratories unremarkable. ULTRASOUND UNREMARKABLE. Hepatic hemangioma stable. Exam unremarkable. I do not think there is any significant primary GI process at this time. Symptoms of nausea with poor appetite may be related to her pregnancy. In any event, I would increase her acid suppressive therapy and administer Antiemetics on a running basis. I discussed with her strategies to increase her caloric intake. In the interim, she is being hydrated. Discussed with Dr. Gaetano Net. GI will see her over the weekend. Thank you  Docia Chuck. Geri Seminole., M.D. [facility] Division of Gastroenterology

## 2017-07-16 NOTE — Progress Notes (Signed)
Pt had chicken broth and a roll and was full for dinner.  No n/v. No complaints.   Enc to eat small frequent meals

## 2017-07-17 DIAGNOSIS — R110 Nausea: Secondary | ICD-10-CM | POA: Diagnosis not present

## 2017-07-17 DIAGNOSIS — R630 Anorexia: Secondary | ICD-10-CM | POA: Diagnosis not present

## 2017-07-17 DIAGNOSIS — R1011 Right upper quadrant pain: Secondary | ICD-10-CM | POA: Diagnosis not present

## 2017-07-17 MED ORDER — ENSURE ENLIVE PO LIQD
237.0000 mL | Freq: Three times a day (TID) | ORAL | Status: DC
  Administered 2017-07-17 – 2017-07-19 (×8): 237 mL via ORAL
  Filled 2017-07-17 (×8): qty 237

## 2017-07-17 NOTE — Plan of Care (Signed)
Patient is resting in bed at this time. States, "I feel better." Sister is at bedside at this time. Patient is pleasant and cooperative with care. Denies any pain or discomfort. Patient ambulates to the bathroom without any difficulty.

## 2017-07-17 NOTE — Progress Notes (Addendum)
Patient ID: Ellen Vasquez, female   DOB: 01-08-1976, 42 y.o.   MRN: [id number]    Progress Note   Subjective   Tired today , gagged this am when she woke up Burning discomfort is the same She has had an Ensure today, yesterday had grits and Ensure   Objective   Vital signs in last 24 hours: Temp:  [97.4 F (36.3 C)-98 F (36.7 C)] 97.9 F (36.6 C) (06/01 0751) Pulse Rate:  [67-75] 75 (06/01 0338) Resp:  [16-18] 16 (06/01 0751) BP: (99-116)/(48-66) 108/66 (06/01 0751) SpO2:  [100 %] 100 % (06/01 0751) Last BM Date: 07/16/17 General:    white female in NAD Heart:  Regular rate and rhythm; no murmurs Lungs: Respirations even and unlabored, lungs CTA bilaterally Abdomen:  Soft, nontender and nondistended. Normal bowel sounds., Gravid uterus above umilicus,  No chest wall pain or costal margin tenderness Extremities:  Without edema. Neurologic:  Alert and oriented,  grossly normal neurologically. Psych:  Cooperative. Normal mood and affect.  Intake/Output from previous day: 05/31 0701 - 06/01 0700 In: 1505 [P.O.:955; I.V.:500; IV Piggyback:50] Out: 4400 [Urine:4400] Intake/Output this shift: Total I/O In: 477 [P.O.:477] Out: 1000 [Urine:1000]  Lab Results: Recent Labs    07/15/17 1819  WBC 9.0  HGB 12.1  HCT 36.0  PLT 196   BMET Recent Labs    07/15/17 1819  NA 137  K 4.0  CL 106  CO2 22  GLUCOSE 80  BUN 6  CREATININE 0.63  CALCIUM 8.8*   LFT Recent Labs    07/15/17 1819  PROT 6.3*  ALBUMIN 3.5  AST 16  ALT 9*  ALKPHOS 76  BILITOT 0.3   PT/INR No results for input(s): LABPROT, INR in the last 72 hours.  Studies/Results: US Abdomen Complete  Result Date: 07/16/2017 CLINICAL DATA:  Right upper quadrant pain. Nausea. Anorexia. EXAM: ABDOMEN ULTRASOUND COMPLETE COMPARISON:  Ultrasound 04/21/2017, 08/01/2013.  CT 02/13/2015. FINDINGS: Gallbladder: No gallstones or wall thickening visualized. No sonographic Murphy sign noted by sonographer.  Common bile duct: Diameter: 4.2 mm Liver: Increased echogenicity consistent fatty infiltration and/or hepatocellular disease. 2.7 cm hypoechoic nodule right hepatic lobe. Similar finding noted on prior ultrasound of 08/01/2013. This is most consistent with a benign hemangioma. Portal vein is patent on color Doppler imaging with normal direction of blood flow towards the liver. IVC: No abnormality visualized. Pancreas: Visualized portion unremarkable. Spleen: Size and appearance within normal limits. Accessory spleen again noted. Right Kidney: Length: 11.9 cm. Normal echogenicity. Mild hydronephrosis. 2.8 cm simple cyst. Left Kidney: Length: 11.9 cm. Normal echogenicity. Small echogenic foci noted possibly representing small calyceal stones. No mass or hydronephrosis visualized. Abdominal aorta: No aneurysm visualized. Other findings: None. IMPRESSION: 1.  Mild right hydronephrosis.  Right renal simple cyst. 2. Small echogenic foci noted in the left kidney, possibly small nonobstructing calyceal stones. 3. Increased hepatic echogenicity consistent with fatty infiltration and/or hepatocellular disease. 2.7 cm hyperechoic nodule is again in the right hepatic lobe. Similar finding noted on prior ultrasound of 08/01/2013. This is most likely a benign hemangioma. 4.  Accessory spleen again noted. Electronically Signed   By: Marcello Moores  Register   On: 07/16/2017 13:28   [place] Mfm Ob Comp + 14 Wk  Result Date: 07/16/2017 ----------------------------------------------------------------------  OBSTETRICS REPORT                      (Signed Final 07/16/2017 08:49 am) ---------------------------------------------------------------------- Patient Info  ID #:  [id number]                          D.O.B.:  May 26, 1975 (41 yrs)  Name:       LISVET Vasquez  [facility]            Visit Date: 07/16/2017 07:52 am ---------------------------------------------------------------------- Performed By  Performed By:     Felecia Jan        Ref.  Address:     [facility]                                                             [facility]                                                             [address]                                                             [place], [place]  Attending:        Oralia Rud       Location:         [facility]                    MD  Referred By:      Molli Posey MD ---------------------------------------------------------------------- Orders   #  Description                                 Code   1  [place] MFM OB COMP + 14 WK                      76805.01  ----------------------------------------------------------------------   #  Ordered By               Order #        Accession #  Episode #   1  Everlene Farrier            [id number]      [id number]     [id number]  ---------------------------------------------------------------------- Indications   [redacted] weeks gestation of pregnancy                Z3A.24   Poor weight gain                               O26.10   Anorexia, weight loss   Encounter for antenatal screening for          Z36.3   malformations  ---------------------------------------------------------------------- OB History  Blood Type:            Height:  5'9"   Weight (lb):  164       BMI:  24.22  Gravidity:    4         Term:   3  Living:       3 ---------------------------------------------------------------------- Fetal Evaluation  Num Of Fetuses:     1  Fetal Heart         124  Rate(bpm):  Cardiac Activity:   Observed  Presentation:       Cephalic  Placenta:           Posterior, above cervical os  P. Cord Insertion:  Visualized, central  Amniotic Fluid  AFI FV:      Subjectively within normal limits                              Largest Pocket(cm)                              5  ---------------------------------------------------------------------- Biometry  BPD:      64.2  mm     G. Age:  26w 0d         79  %    CI:        70.53   %    70 - 86                                                          FL/HC:      19.9   %    18.7 - 20.3  HC:      243.7  mm     G. Age:  26w 3d         83  %    HC/AC:      1.15        1.04 - 1.22  AC:      212.7  mm     G. Age:  25w 6d         70  %    FL/BPD:     75.4   %    71 - 87  FL:       48.4  mm     G. Age:  26w 2d         78  %    FL/AC:      22.8   %    20 - 24  HUM:  43.4  mm     G. Age:  25w 6d         69  %  CER:      28.9  mm     G. Age:  25w 6d         67  %  LV:        9.1  mm  CM:        5.8  mm  Est. FW:     882  gm    1 lb 15 oz      75  % ---------------------------------------------------------------------- Gestational Age  Clinical EDD:  25w 4d                                        EDD:   10/25/17  U/S Today:     26w 1d                                        EDD:   10/21/17  Best:          24w 6d     Det. ByLoman Chroman         EDD:   10/30/17                                      (03/26/17) ---------------------------------------------------------------------- Anatomy  Cranium:               Appears normal         Aortic Arch:            Not well visualized  Cavum:                 Appears normal         Ductal Arch:            Not well visualized  Ventricles:            Appears normal         Diaphragm:              Appears normal  Choroid Plexus:        Appears normal         Stomach:                Appears normal, left                                                                        sided  Cerebellum:            Appears normal         Abdomen:                Appears normal  Posterior Fossa:       Appears normal         Abdominal Wall:         Appears nml (cord  insert, abd wall)  Nuchal Fold:           Appears normal         Cord Vessels:            Appears normal (3                                                                        vessel cord)  Face:                  Appears normal         Kidneys:                Appear normal                         (orbits and profile)  Lips:                  Appears normal         Bladder:                Appears normal  Thoracic:              Appears normal         Spine:                  Appears normal  Heart:                 Appears normal         Upper Extremities:      Appears normal                         (4CH, axis, and                         situs)  RVOT:                  Appears normal         Lower Extremities:      Appears normal  LVOT:                  Appears normal  Other:  Nasal bone visualized. Heels visualized. Female gender. Technically          difficult due to fetal position. ---------------------------------------------------------------------- Cervix Uterus Adnexa  Cervix  Length:              4  cm.  Normal appearance by transabdominal scan.  Left Ovary  Within normal limits.  Right Ovary  Within normal limits. ---------------------------------------------------------------------- Impression  Single living intrauterine pregnancy at 24w 6d.  Cephalic presentation.  Placenta Posterior, above cervical os.  Appropriate fetal growth.  Normal amniotic fluid volume.  The fetal anatomic survey is NOT complete.  Limited views as detailed above  Normal fetal anatomy.  No fetal anomalies or soft markers of aneuploidy seen.  The adnexa appear normal bilaterally without masses.  The cervix measures 4cm on transabdominal imaging without  funneling. ---------------------------------------------------------------------- Recommendations  Recommend follow-up ultrasound examination in 6 weeks to  complete survey. ----------------------------------------------------------------------  Oralia Rud, MD Electronically Signed Final Report   07/16/2017 08:49 am  ----------------------------------------------------------------------      Assessment / Plan:    #1 42 yo female 25 weeks and 5 days - with persistent nausea, intermittent vomiting and ruq and right lower chest discomfort   [place] as above - neg for GB disease, has fatty liver and a stable small hemangioma Fetal US - appropriate fetal growth  Burning discomfort likely GERD related Unfortunately nausea is likely pregnancy related and may persist!  Plan Bland diet as tolerates, Ensure twice daily her and on discharge  Continue BID Pepcid IV for now - on discharge would have her stay on BID Pepcid 20 mg  Zofran 4 mg q 6 hours around the clock - not prn -here and on Discharge Happy to see her in office as needed  Pt should try  to be upright during the day and keep HOB elevated at night to help with GERD  Contact  Amy Esterwood, P.A.-C               [phone]  Active Problems:   Anorexia   [redacted] weeks gestation of pregnancy   Nausea   Abdominal discomfort in right upper quadrant     LOS: 1 day   Amy Esterwood  07/17/2017, 11:50 AM   GI ATTENDING  Interval history data reviewed. Agree with interval progress note. Nothing additionally to add. We will sign off for now but are happy to see her as needed. Thank you.  Docia Chuck. Geri Seminole., M.D. [facility] Division of Gastroenterology

## 2017-07-17 NOTE — Progress Notes (Signed)
25 5/7 wks Slept well with Ambien Tolerated Ensure and Boost-likes Ensure better BM last pm-no diarrhea  Vitals:   07/17/17 0338 07/17/17 0751  BP: (!) 99/53 108/66  Pulse: 75   Resp: 16 16  Temp: (!) 97.4 F (36.3 C) 97.9 F (36.6 C)  SpO2: 100% 100%   NST +accels  A/P: Anorexia         Epigastric pain         Continue present treatment

## 2017-07-17 NOTE — Progress Notes (Signed)
Called to Patient's room. Upon arrival, Patient noted to be in bed with HOB elevated and tearful. Patient states, "I'm hurting. I'm having cramps. They will go away, but then they come back after a little while. I don't think that they are contractions, but they hurt." Patient reports that she uses heating packs to help ease the pain. Patient noted to have two heating packs on lower abdomen. No vaginal bleeding or discharge noted. Patient denies any other pain. Dr. Corinna Capra notified and made aware of Patient's statements. Apply fetal monitor and Toco  per Dr. Corinna Capra. No other orders received at this time. Nurse for 7p-7a shift made aware.

## 2017-07-18 NOTE — Progress Notes (Signed)
Had a bad night-fatigued and then bad menstrual type cramps on /off No bleeding/no leaking, no cramps now More nausea last pm, OK now after am Zofran BM x 2 this admission  Vitals:   07/17/17 2326 07/18/17 0721  BP: (!) 110/58 105/61  Pulse: 76 69  Resp: 16 16  Temp: 98.5 F (36.9 C) 98.1 F (36.7 C)  SpO2: 100% 100%   Abdomen > uterus soft, NT FHT reactive, no UCs tracing  A/P: Continue with same plan          If no better tomorrow will D/W MFM possible transfer

## 2017-07-18 NOTE — Plan of Care (Signed)
Pt remain safe and free from injury, free from bowel complication and tolerating her activity well.

## 2017-07-19 DIAGNOSIS — R1013 Epigastric pain: Secondary | ICD-10-CM | POA: Diagnosis not present

## 2017-07-19 DIAGNOSIS — O26892 Other specified pregnancy related conditions, second trimester: Secondary | ICD-10-CM | POA: Diagnosis not present

## 2017-07-19 DIAGNOSIS — K30 Functional dyspepsia: Secondary | ICD-10-CM | POA: Diagnosis not present

## 2017-07-19 DIAGNOSIS — R630 Anorexia: Secondary | ICD-10-CM | POA: Diagnosis not present

## 2017-07-19 DIAGNOSIS — F419 Anxiety disorder, unspecified: Secondary | ICD-10-CM | POA: Diagnosis not present

## 2017-07-19 DIAGNOSIS — O99612 Diseases of the digestive system complicating pregnancy, second trimester: Secondary | ICD-10-CM | POA: Diagnosis not present

## 2017-07-19 DIAGNOSIS — O26899 Other specified pregnancy related conditions, unspecified trimester: Secondary | ICD-10-CM | POA: Diagnosis not present

## 2017-07-19 DIAGNOSIS — Z3A25 25 weeks gestation of pregnancy: Secondary | ICD-10-CM | POA: Diagnosis not present

## 2017-07-19 DIAGNOSIS — N132 Hydronephrosis with renal and ureteral calculous obstruction: Secondary | ICD-10-CM | POA: Diagnosis not present

## 2017-07-19 DIAGNOSIS — Z3A26 26 weeks gestation of pregnancy: Secondary | ICD-10-CM | POA: Diagnosis not present

## 2017-07-19 DIAGNOSIS — O9989 Other specified diseases and conditions complicating pregnancy, childbirth and the puerperium: Secondary | ICD-10-CM | POA: Diagnosis not present

## 2017-07-19 DIAGNOSIS — F329 Major depressive disorder, single episode, unspecified: Secondary | ICD-10-CM | POA: Diagnosis not present

## 2017-07-19 DIAGNOSIS — H1089 Other conjunctivitis: Secondary | ICD-10-CM | POA: Diagnosis not present

## 2017-07-19 DIAGNOSIS — O99342 Other mental disorders complicating pregnancy, second trimester: Secondary | ICD-10-CM | POA: Diagnosis not present

## 2017-07-19 DIAGNOSIS — O2612 Low weight gain in pregnancy, second trimester: Secondary | ICD-10-CM | POA: Diagnosis not present

## 2017-07-19 DIAGNOSIS — R197 Diarrhea, unspecified: Secondary | ICD-10-CM | POA: Diagnosis not present

## 2017-07-19 DIAGNOSIS — R159 Full incontinence of feces: Secondary | ICD-10-CM | POA: Diagnosis not present

## 2017-07-19 DIAGNOSIS — O99352 Diseases of the nervous system complicating pregnancy, second trimester: Secondary | ICD-10-CM | POA: Diagnosis not present

## 2017-07-19 DIAGNOSIS — R110 Nausea: Secondary | ICD-10-CM | POA: Diagnosis not present

## 2017-07-19 DIAGNOSIS — R109 Unspecified abdominal pain: Secondary | ICD-10-CM | POA: Diagnosis not present

## 2017-07-19 DIAGNOSIS — G43109 Migraine with aura, not intractable, without status migrainosus: Secondary | ICD-10-CM | POA: Diagnosis not present

## 2017-07-19 DIAGNOSIS — N200 Calculus of kidney: Secondary | ICD-10-CM | POA: Diagnosis not present

## 2017-07-19 DIAGNOSIS — O09522 Supervision of elderly multigravida, second trimester: Secondary | ICD-10-CM | POA: Diagnosis not present

## 2017-07-19 DIAGNOSIS — R112 Nausea with vomiting, unspecified: Secondary | ICD-10-CM | POA: Diagnosis not present

## 2017-07-19 DIAGNOSIS — N393 Stress incontinence (female) (male): Secondary | ICD-10-CM | POA: Diagnosis not present

## 2017-07-19 LAB — TYPE AND SCREEN
ABO/RH(D): B POS
Antibody Screen: NEGATIVE

## 2017-07-19 NOTE — Progress Notes (Signed)
Facesheet faxed to [facility] transfer center. Report on patient called to Phill Mutter, RN at [facility] labor and delivery triage. Receiving MD at [facility] is Dr. Ferrel Logan.

## 2017-07-19 NOTE — Discharge Summary (Signed)
Physician Discharge Summary  Patient ID: LACYE MCCARN MRN: [id number] DOB/AGE: 05/11/1975 42 y.o.  Admit date: 07/15/2017 Discharge date: 07/19/2017  Admission Diagnoses:Anorexia, Nausea, RUQ pain, 25 3/7 weeks  Discharge Diagnoses: Anorexia, Nausea, RUQ pain, 26 0/7 weeks   Discharged Condition: stable  Hospital Course: admitted to hospital for evaluation and treatment of progressive anorexia, RUQ pain, nausea unresponsive to outpatient management. IV fluids started and labs return as normal. GI consulted and they placed patient on scheduled Zofran and increased dose of Pepcid. Patient developed loose stool into bed twice including night before discharge. Nutritionist consulted and Ensure and Boost given. She tolerated some Ensure only with Zofran. MFM also consulted and U/S noted normal fetal growth and felt this was primarily a GI problem. NST noted good accelerations. Patient also C/O some pelvic cramps at times severe. No bleeding or leaking of fluid noted. Uterus remained soft and NT to exam. Evaluation on admission in March of this year included RUQ U/S with fatty liver. After lack of improvement recommendation was made for transfer to another facility for another GI evaluation. The patient agreed.  Consults: GI and MFM, nutrition  Significant Diagnostic Studies: labs:  Recent Results (from the past 2160 hour(s))  Type and screen [place]     Status: None   Collection Time: 04/21/17 11:52 AM  Result Value Ref Range   ABO/RH(D) B POS    Antibody Screen NEG    Sample Expiration      04/24/2017 Performed at [facility], [address], [place], [place] [address]   CBC     Status: None   Collection Time: 04/21/17 11:52 AM  Result Value Ref Range   WBC 7.1 4.0 - 10.5 K/uL   RBC 4.32 3.87 - 5.11 MIL/uL   Hemoglobin 13.3 12.0 - 15.0 g/dL   HCT 37.4 36.0 - 46.0 %   MCV 86.6 78.0 - 100.0 fL   MCH 30.8 26.0 - 34.0 pg   MCHC 35.6 30.0 - 36.0 g/dL    RDW 12.5 11.5 - 15.5 %   Platelets 199 150 - 400 K/uL    Comment: Performed at [facility], [address], [place], [place] [address]  Comprehensive metabolic panel     Status: Abnormal   Collection Time: 04/21/17 11:52 AM  Result Value Ref Range   Sodium 135 135 - 145 mmol/L   Potassium 3.8 3.5 - 5.1 mmol/L   Chloride 107 101 - 111 mmol/L   CO2 20 (L) 22 - 32 mmol/L   Glucose, Bld 74 65 - 99 mg/dL   BUN 8 6 - 20 mg/dL   Creatinine, Ser 0.58 0.44 - 1.00 mg/dL   Calcium 8.9 8.9 - 10.3 mg/dL   Total Protein 6.8 6.5 - 8.1 g/dL   Albumin 4.0 3.5 - 5.0 g/dL   AST 20 15 - 41 U/L   ALT 14 14 - 54 U/L   Alkaline Phosphatase 52 38 - 126 U/L   Total Bilirubin 0.7 0.3 - 1.2 mg/dL   GFR calc non Af Amer >60 >60 mL/min   GFR calc Af Amer >60 >60 mL/min    Comment: (NOTE) The eGFR has been calculated using the CKD EPI equation. This calculation has not been validated in all clinical situations. eGFR's persistently <60 mL/min signify possible Chronic Kidney Disease.    Anion gap 8 5 - 15    Comment: Performed at [facility], [address] [place],  [address]  Amylase  Status: None   Collection Time: 04/21/17 11:52 AM  Result Value Ref Range   Amylase 81 28 - 100 U/L    Comment: Performed at [facility], [address], [place], [place] [address]  Lipase, blood     Status: None   Collection Time: 04/21/17 11:52 AM  Result Value Ref Range   Lipase 32 11 - 51 U/L    Comment: Performed at [facility], [place], [place] [address]  C difficile quick scan w PCR reflex     Status: None   Collection Time: 04/21/17  8:50 PM  Result Value Ref Range   C Diff antigen NEGATIVE NEGATIVE   C Diff toxin NEGATIVE NEGATIVE   C Diff interpretation No C. difficile detected.     Comment: Performed at [facility], [place] [address], [place], [place] [address]  OVA + PARASITE EXAM     Status: None   Collection Time: 04/21/17  8:50 PM  Result Value Ref  Range   OVA + PARASITE EXAM Final report     Comment: (NOTE) These results were obtained using wet preparation(s) and trichrome stained smear. This test does not include testing for Cryptosporidium parvum, Cyclospora, or Microsporidia. Performed At: [place], [place] [id number] Caprice Red MD [id number]    Source of Sample STOOL     Comment: Performed at [facility], [address], [place], [place] [address]  Occult blood card to lab, stool RN will collect     Status: None   Collection Time: 04/21/17  8:50 PM  Result Value Ref Range   Fecal Occult Bld NEGATIVE NEGATIVE    Comment: Performed at [facility], [address] [place], [place] [address]  Gastrointestinal Panel by PCR , Stool     Status: Abnormal   Collection Time: 04/21/17  8:50 PM  Result Value Ref Range   Campylobacter species NOT DETECTED NOT DETECTED   Plesimonas shigelloides NOT DETECTED NOT DETECTED   Salmonella species NOT DETECTED NOT DETECTED   Yersinia enterocolitica NOT DETECTED NOT DETECTED   Vibrio species NOT DETECTED NOT DETECTED   Vibrio cholerae NOT DETECTED NOT DETECTED   Enteroaggregative E coli (EAEC) NOT DETECTED NOT DETECTED   Enteropathogenic E coli (EPEC) DETECTED (A) NOT DETECTED    Comment: RESULT CALLED TO, READ BACK BY AND VERIFIED WITH: Rushie Chestnut AT 1420 04/22/17 SDR    Enterotoxigenic E coli (ETEC) NOT DETECTED NOT DETECTED   Shiga like toxin producing E coli (STEC) NOT DETECTED NOT DETECTED   Shigella/Enteroinvasive E coli (EIEC) NOT DETECTED NOT DETECTED   Cryptosporidium NOT DETECTED NOT DETECTED   Cyclospora cayetanensis NOT DETECTED NOT DETECTED   Entamoeba histolytica NOT DETECTED NOT DETECTED   Giardia lamblia NOT DETECTED NOT DETECTED   Adenovirus F40/41 NOT DETECTED NOT DETECTED   Astrovirus NOT DETECTED NOT DETECTED   Norovirus GI/GII NOT DETECTED NOT DETECTED   Rotavirus A NOT DETECTED NOT DETECTED   Sapovirus (I,  II, IV, and V) NOT DETECTED NOT DETECTED    Comment: Performed at [facility], [address] [place], [place] [address]  O&P Result     Status: None   Collection Time: 04/21/17  8:50 PM  Result Value Ref Range   O&P result 1 Comment     Comment: (NOTE) No ova, cysts, or parasites seen. One negative specimen does not rule out the possibility of a parasitic infection. Performed At: [facility]  [place] [id number] Rush Farmer MD YQ:[id number] Performed at [facility], [address] [place], [place] [address]   Comprehensive metabolic panel     Status: Abnormal   Collection Time: 04/23/17  3:38 PM  Result Value Ref Range   Sodium 134 (L) 135 - 145 mmol/L   Potassium 3.3 (L) 3.5 - 5.1 mmol/L   Chloride 109 101 - 111 mmol/L   CO2 20 (L) 22 - 32 mmol/L   Glucose, Bld 100 (H) 65 - 99 mg/dL   BUN <5 (L) 6 - 20 mg/dL    Comment: REPEATED TO VERIFY   Creatinine, Ser 0.50 0.44 - 1.00 mg/dL   Calcium 8.5 (L) 8.9 - 10.3 mg/dL   Total Protein 6.2 (L) 6.5 - 8.1 g/dL   Albumin 3.5 3.5 - 5.0 g/dL   AST 19 15 - 41 U/L   ALT 16 14 - 54 U/L   Alkaline Phosphatase 52 38 - 126 U/L   Total Bilirubin 0.6 0.3 - 1.2 mg/dL   GFR calc non Af Amer >60 >60 mL/min   GFR calc Af Amer >60 >60 mL/min    Comment: (NOTE) The eGFR has been calculated using the CKD EPI equation. This calculation has not been validated in all clinical situations. eGFR's persistently <60 mL/min signify possible Chronic Kidney Disease.    Anion gap 5 5 - 15    Comment: Performed at [facility], [address], [place], [place] [address]  CBC     Status: Abnormal   Collection Time: 04/23/17  3:38 PM  Result Value Ref Range   WBC 7.8 4.0 - 10.5 K/uL   RBC 4.02 3.87 - 5.11 MIL/uL   Hemoglobin 12.3 12.0 - 15.0 g/dL   HCT 34.5 (L) 36.0 - 46.0 %   MCV 85.8 78.0 - 100.0 fL   MCH 30.6 26.0 - 34.0 pg   MCHC 35.7 30.0 - 36.0 g/dL   RDW 12.4 11.5 - 15.5 %   Platelets 194  150 - 400 K/uL    Comment: Performed at [facility], [address] [place], [place] [address]  Urinalysis, Routine w reflex microscopic     Status: None   Collection Time: 04/26/17  2:00 AM  Result Value Ref Range   Color, Urine YELLOW YELLOW   APPearance CLEAR CLEAR   Specific Gravity, Urine 1.008 1.005 - 1.030   pH 6.0 5.0 - 8.0   Glucose, UA NEGATIVE NEGATIVE mg/dL   Hgb urine dipstick NEGATIVE NEGATIVE   Bilirubin Urine NEGATIVE NEGATIVE   Ketones, ur NEGATIVE NEGATIVE mg/dL   Protein, ur NEGATIVE NEGATIVE mg/dL   Nitrite NEGATIVE NEGATIVE   Leukocytes, UA NEGATIVE NEGATIVE    Comment: Performed at [facility], [address], [place], [place] [address]  CBC     Status: Abnormal   Collection Time: 04/26/17  5:51 AM  Result Value Ref Range   WBC 8.0 4.0 - 10.5 K/uL   RBC 4.04 3.87 - 5.11 MIL/uL   Hemoglobin 12.3 12.0 - 15.0 g/dL   HCT 34.8 (L) 36.0 - 46.0 %   MCV 86.1 78.0 - 100.0 fL   MCH 30.4 26.0 - 34.0 pg   MCHC 35.3 30.0 - 36.0 g/dL   RDW 12.4 11.5 - 15.5 %   Platelets 197 150 - 400 K/uL    Comment: Performed at [facility], [address], [place], [place] [address]  Comprehensive metabolic panel     Status: Abnormal   Collection Time: 04/26/17  5:51 AM  Result Value Ref Range   Sodium 134 (L) 135 - 145 mmol/L   Potassium 3.5 3.5 - 5.1 mmol/L   Chloride 108 101 - 111 mmol/L   CO2 20 (L) 22 - 32 mmol/L   Glucose, Bld 85 65 - 99 mg/dL   BUN 7 6 - 20 mg/dL   Creatinine, Ser 0.62 0.44 - 1.00 mg/dL   Calcium 8.5 (L) 8.9 - 10.3 mg/dL   Total Protein 6.1 (L) 6.5 - 8.1 g/dL   Albumin 3.5 3.5 - 5.0 g/dL   AST 14 (L) 15 - 41 U/L   ALT 14 14 - 54 U/L   Alkaline Phosphatase 51 38 - 126 U/L   Total Bilirubin 0.4 0.3 - 1.2 mg/dL   GFR calc non Af Amer >60 >60 mL/min   GFR calc Af Amer >60 >60 mL/min    Comment: (NOTE) The eGFR has been calculated using the CKD EPI equation. This calculation has not been validated in all clinical situations. eGFR's  persistently <60 mL/min signify possible Chronic Kidney Disease.    Anion gap 6 5 - 15    Comment: Performed at [facility], [address] [place], [place] [address]  Lipase, blood     Status: None   Collection Time: 04/26/17  5:51 AM  Result Value Ref Range   Lipase 26 11 - 51 U/L    Comment: Performed at [facility], [address], [place], [place] [address]  Type and screen [place]     Status: None   Collection Time: 04/26/17  5:51 AM  Result Value Ref Range   ABO/RH(D) B POS    Antibody Screen NEG    Sample Expiration      04/29/2017 Performed at [facility], [place], [place] [address]   C difficile quick scan w PCR reflex     Status: None   Collection Time: 04/27/17  5:45 PM  Result Value Ref Range   C Diff antigen NEGATIVE NEGATIVE   C Diff toxin NEGATIVE NEGATIVE   C Diff interpretation No C. difficile detected.     Comment: Performed at [facility], [place] [address], [place], [place] [address]  Urinalysis, Routine w reflex microscopic     Status: Abnormal   Collection Time: 06/22/17 11:58 AM  Result Value Ref Range   Color, Urine STRAW (A) YELLOW   APPearance CLEAR CLEAR   Specific Gravity, Urine 1.003 (L) 1.005 - 1.030   pH 7.0 5.0 - 8.0   Glucose, UA NEGATIVE NEGATIVE mg/dL   Hgb urine dipstick NEGATIVE NEGATIVE   Bilirubin Urine NEGATIVE NEGATIVE   Ketones, ur NEGATIVE NEGATIVE mg/dL   Protein, ur NEGATIVE NEGATIVE mg/dL   Nitrite NEGATIVE NEGATIVE   Leukocytes, UA NEGATIVE NEGATIVE    Comment: Performed at [facility], [address] [place], [place] [address]  Type and screen [place]     Status: None   Collection Time: 07/15/17  6:19 PM  Result Value Ref Range   ABO/RH(D) B POS    Antibody Screen NEG    Sample Expiration      07/18/2017 Performed at [facility], [address] [place], [place] [address]   Comprehensive metabolic panel     Status: Abnormal    Collection Time: 07/15/17  6:19 PM  Result Value Ref Range   Sodium 137 135 - 145 mmol/L   Potassium 4.0 3.5 - 5.1 mmol/L   Chloride 106 101 -  111 mmol/L   CO2 22 22 - 32 mmol/L   Glucose, Bld 80 65 - 99 mg/dL   BUN 6 6 - 20 mg/dL   Creatinine, Ser 0.63 0.44 - 1.00 mg/dL   Calcium 8.8 (L) 8.9 - 10.3 mg/dL   Total Protein 6.3 (L) 6.5 - 8.1 g/dL   Albumin 3.5 3.5 - 5.0 g/dL   AST 16 15 - 41 U/L   ALT 9 (L) 14 - 54 U/L   Alkaline Phosphatase 76 38 - 126 U/L   Total Bilirubin 0.3 0.3 - 1.2 mg/dL   GFR calc non Af Amer >60 >60 mL/min   GFR calc Af Amer >60 >60 mL/min    Comment: (NOTE) The eGFR has been calculated using the CKD EPI equation. This calculation has not been validated in all clinical situations. eGFR's persistently <60 mL/min signify possible Chronic Kidney Disease.    Anion gap 9 5 - 15    Comment: Performed at [facility], [place], [place] [address]  TSH     Status: None   Collection Time: 07/15/17  6:19 PM  Result Value Ref Range   TSH 1.456 0.350 - 4.500 uIU/mL    Comment: Performed by a 3rd Generation assay with a functional sensitivity of <=0.01 uIU/mL. Performed at [facility], [address], [place], [place] [address]   CBC on admission     Status: None   Collection Time: 07/15/17  6:19 PM  Result Value Ref Range   WBC 9.0 4.0 - 10.5 K/uL   RBC 3.88 3.87 - 5.11 MIL/uL   Hemoglobin 12.1 12.0 - 15.0 g/dL   HCT 36.0 36.0 - 46.0 %   MCV 92.8 78.0 - 100.0 fL   MCH 31.2 26.0 - 34.0 pg   MCHC 33.6 30.0 - 36.0 g/dL   RDW 13.1 11.5 - 15.5 %   Platelets 196 150 - 400 K/uL    Comment: Performed at [facility], [address] [place], [place] [address]  Urinalysis, Routine w reflex microscopic     Status: Abnormal   Collection Time: 07/15/17 10:00 PM  Result Value Ref Range   Color, Urine STRAW (A) YELLOW   APPearance CLEAR CLEAR   Specific Gravity, Urine 1.003 (L) 1.005 - 1.030   pH 8.0 5.0 - 8.0   Glucose, UA NEGATIVE  NEGATIVE mg/dL   Hgb urine dipstick NEGATIVE NEGATIVE   Bilirubin Urine NEGATIVE NEGATIVE   Ketones, ur NEGATIVE NEGATIVE mg/dL   Protein, ur NEGATIVE NEGATIVE mg/dL   Nitrite NEGATIVE NEGATIVE   Leukocytes, UA NEGATIVE NEGATIVE    Comment: Performed at [facility], [address] [place], [place] [address]  Type and screen [place]     Status: None   Collection Time: 07/19/17  6:05 AM  Result Value Ref Range   ABO/RH(D) B POS    Antibody Screen NEG    Sample Expiration      07/22/2017 Performed at [facility], [address] [place], [place] [address]    Treatments: IV hydration   Discharge Exam: Blood pressure (!) 111/56, pulse (!) 55, temperature 98.5 F (36.9 C), temperature source Oral, resp. rate 17, height _0  (1.753 m), weight 183 lb 12 oz (83.3 kg), last menstrual period 09/05/2016, SpO2 91 %, unknown if currently breastfeeding. General appearance: alert and fatigued GI: soft, non-tender; bowel sounds normal; no masses,  no organomegaly  Disposition: Discharge disposition: [place] Not Defined  Allergies as of 07/19/2017      Reactions   Shellfish Allergy Hives   Verapamil    GI sx, weakness   Other    Surgical glue causes a rash   Sulfa Antibiotics Dermatitis, Rash      Medication List    STOP taking these medications   acetaminophen 325 MG tablet Commonly known as:  TYLENOL   butalbital-acetaminophen-caffeine 50-325-40 MG tablet Commonly known as:  FIORICET, ESGIC   CITRANATAL ASSURE PO   ondansetron 4 MG disintegrating tablet Commonly known as:  ZOFRAN-ODT   promethazine 25 MG tablet Commonly known as:  PHENERGAN   ranitidine 75 MG tablet Commonly known as:  ZANTAC   sertraline 100 MG tablet Commonly known as:  ZOLOFT        Signed: Shon Millet II 07/19/2017, 9:01 PM

## 2017-07-19 NOTE — Progress Notes (Signed)
Transferred to [facility] by [facility]. Assessments remained unchanged prior to transfer. BP (!) 111/56 (BP Location: Left Arm)   Pulse (!) 55   Temp 98.5 F (36.9 C) (Oral)   Resp 17   Ht 5\' 9"  (175.3 cm)   Wt 183 lb 12 oz (83348 g)   LMP 09/05/2016 (Exact Date)   SpO2 91%   BMI 27.14 kg/m

## 2017-07-19 NOTE — Progress Notes (Signed)
26 0/7 weeks  Patient had diarrhea stool in bed last night Today remains fatigued with no appetite Tolerated Ensure yesterday  Vitals:   07/19/17 1209 07/19/17 1614  BP: (!) 99/56 (!) 108/45  Pulse: 72 80  Resp: 18 18  Temp: 98.8 F (37.1 C) 98.1 F (36.7 C)  SpO2: 100% 100%   No epigastric tenderness Uterus soft NT FHT reaactive   A/P: Continued anorexia, nausea and sharp epigastric pain now with diarrhea         Rec: transfer to a referral center for further GI evaluation. D/W patient and she agrees.  I D/W Dr Ferrel Logan @ [facility] who agrees to accept transfer.

## 2017-07-20 DIAGNOSIS — R197 Diarrhea, unspecified: Secondary | ICD-10-CM | POA: Diagnosis not present

## 2017-07-20 DIAGNOSIS — O26892 Other specified pregnancy related conditions, second trimester: Secondary | ICD-10-CM | POA: Diagnosis not present

## 2017-07-20 DIAGNOSIS — R110 Nausea: Secondary | ICD-10-CM | POA: Diagnosis not present

## 2017-07-20 DIAGNOSIS — O99612 Diseases of the digestive system complicating pregnancy, second trimester: Secondary | ICD-10-CM | POA: Diagnosis not present

## 2017-07-20 DIAGNOSIS — R109 Unspecified abdominal pain: Secondary | ICD-10-CM | POA: Diagnosis not present

## 2017-07-20 DIAGNOSIS — Z3A26 26 weeks gestation of pregnancy: Secondary | ICD-10-CM | POA: Diagnosis not present

## 2017-07-20 DIAGNOSIS — R1013 Epigastric pain: Secondary | ICD-10-CM | POA: Diagnosis not present

## 2017-07-21 DIAGNOSIS — O26892 Other specified pregnancy related conditions, second trimester: Secondary | ICD-10-CM | POA: Diagnosis not present

## 2017-07-21 DIAGNOSIS — N393 Stress incontinence (female) (male): Secondary | ICD-10-CM | POA: Diagnosis not present

## 2017-07-21 DIAGNOSIS — R1013 Epigastric pain: Secondary | ICD-10-CM | POA: Diagnosis not present

## 2017-07-21 DIAGNOSIS — R110 Nausea: Secondary | ICD-10-CM | POA: Diagnosis not present

## 2017-07-21 DIAGNOSIS — R112 Nausea with vomiting, unspecified: Secondary | ICD-10-CM | POA: Diagnosis not present

## 2017-07-21 DIAGNOSIS — R109 Unspecified abdominal pain: Secondary | ICD-10-CM | POA: Diagnosis not present

## 2017-07-21 DIAGNOSIS — O2612 Low weight gain in pregnancy, second trimester: Secondary | ICD-10-CM | POA: Diagnosis not present

## 2017-07-28 DIAGNOSIS — Z3A27 27 weeks gestation of pregnancy: Secondary | ICD-10-CM | POA: Diagnosis not present

## 2017-07-28 DIAGNOSIS — O2612 Low weight gain in pregnancy, second trimester: Secondary | ICD-10-CM | POA: Diagnosis not present

## 2017-08-05 DIAGNOSIS — Z23 Encounter for immunization: Secondary | ICD-10-CM | POA: Diagnosis not present

## 2017-08-05 DIAGNOSIS — Z3A28 28 weeks gestation of pregnancy: Secondary | ICD-10-CM | POA: Diagnosis not present

## 2017-08-05 DIAGNOSIS — Z3480 Encounter for supervision of other normal pregnancy, unspecified trimester: Secondary | ICD-10-CM | POA: Diagnosis not present

## 2017-08-11 DIAGNOSIS — O2612 Low weight gain in pregnancy, second trimester: Secondary | ICD-10-CM | POA: Diagnosis not present

## 2017-08-11 DIAGNOSIS — Z3A29 29 weeks gestation of pregnancy: Secondary | ICD-10-CM | POA: Diagnosis not present

## 2017-08-13 DIAGNOSIS — R197 Diarrhea, unspecified: Secondary | ICD-10-CM | POA: Diagnosis not present

## 2017-08-13 DIAGNOSIS — N200 Calculus of kidney: Secondary | ICD-10-CM | POA: Diagnosis not present

## 2017-08-13 DIAGNOSIS — F329 Major depressive disorder, single episode, unspecified: Secondary | ICD-10-CM | POA: Diagnosis not present

## 2017-08-13 DIAGNOSIS — Z3A29 29 weeks gestation of pregnancy: Secondary | ICD-10-CM | POA: Diagnosis not present

## 2017-08-13 DIAGNOSIS — Z79899 Other long term (current) drug therapy: Secondary | ICD-10-CM | POA: Diagnosis not present

## 2017-08-13 DIAGNOSIS — O99343 Other mental disorders complicating pregnancy, third trimester: Secondary | ICD-10-CM | POA: Diagnosis not present

## 2017-08-13 DIAGNOSIS — Z882 Allergy status to sulfonamides status: Secondary | ICD-10-CM | POA: Diagnosis not present

## 2017-08-13 DIAGNOSIS — O99613 Diseases of the digestive system complicating pregnancy, third trimester: Secondary | ICD-10-CM | POA: Diagnosis not present

## 2017-08-13 DIAGNOSIS — O09523 Supervision of elderly multigravida, third trimester: Secondary | ICD-10-CM | POA: Diagnosis not present

## 2017-08-13 DIAGNOSIS — O9989 Other specified diseases and conditions complicating pregnancy, childbirth and the puerperium: Secondary | ICD-10-CM | POA: Diagnosis not present

## 2017-08-13 DIAGNOSIS — K529 Noninfective gastroenteritis and colitis, unspecified: Secondary | ICD-10-CM | POA: Diagnosis not present

## 2017-08-13 DIAGNOSIS — O26893 Other specified pregnancy related conditions, third trimester: Secondary | ICD-10-CM | POA: Diagnosis not present

## 2017-08-18 DIAGNOSIS — Z3A30 30 weeks gestation of pregnancy: Secondary | ICD-10-CM | POA: Diagnosis not present

## 2017-08-18 DIAGNOSIS — O2623 Pregnancy care for patient with recurrent pregnancy loss, third trimester: Secondary | ICD-10-CM | POA: Diagnosis not present

## 2017-08-20 DIAGNOSIS — Z3A30 30 weeks gestation of pregnancy: Secondary | ICD-10-CM | POA: Diagnosis not present

## 2017-08-20 DIAGNOSIS — Z6827 Body mass index (BMI) 27.0-27.9, adult: Secondary | ICD-10-CM | POA: Diagnosis not present

## 2017-08-20 DIAGNOSIS — A0472 Enterocolitis due to Clostridium difficile, not specified as recurrent: Secondary | ICD-10-CM | POA: Diagnosis not present

## 2017-08-20 DIAGNOSIS — G935 Compression of brain: Secondary | ICD-10-CM | POA: Diagnosis not present

## 2017-08-20 DIAGNOSIS — Z8619 Personal history of other infectious and parasitic diseases: Secondary | ICD-10-CM | POA: Diagnosis not present

## 2017-08-20 DIAGNOSIS — O99343 Other mental disorders complicating pregnancy, third trimester: Secondary | ICD-10-CM | POA: Diagnosis not present

## 2017-08-20 DIAGNOSIS — O2613 Low weight gain in pregnancy, third trimester: Secondary | ICD-10-CM | POA: Diagnosis not present

## 2017-08-20 DIAGNOSIS — F329 Major depressive disorder, single episode, unspecified: Secondary | ICD-10-CM | POA: Diagnosis not present

## 2017-08-20 DIAGNOSIS — N1330 Unspecified hydronephrosis: Secondary | ICD-10-CM | POA: Diagnosis not present

## 2017-08-20 DIAGNOSIS — Z792 Long term (current) use of antibiotics: Secondary | ICD-10-CM | POA: Diagnosis not present

## 2017-08-20 DIAGNOSIS — O9989 Other specified diseases and conditions complicating pregnancy, childbirth and the puerperium: Secondary | ICD-10-CM | POA: Diagnosis not present

## 2017-08-20 DIAGNOSIS — O98813 Other maternal infectious and parasitic diseases complicating pregnancy, third trimester: Secondary | ICD-10-CM | POA: Diagnosis not present

## 2017-08-20 DIAGNOSIS — O99353 Diseases of the nervous system complicating pregnancy, third trimester: Secondary | ICD-10-CM | POA: Diagnosis not present

## 2017-08-20 DIAGNOSIS — Z8719 Personal history of other diseases of the digestive system: Secondary | ICD-10-CM | POA: Diagnosis not present

## 2017-08-20 DIAGNOSIS — N809 Endometriosis, unspecified: Secondary | ICD-10-CM | POA: Diagnosis not present

## 2017-08-20 DIAGNOSIS — O09513 Supervision of elderly primigravida, third trimester: Secondary | ICD-10-CM | POA: Diagnosis not present

## 2017-08-20 DIAGNOSIS — O26833 Pregnancy related renal disease, third trimester: Secondary | ICD-10-CM | POA: Diagnosis not present

## 2017-09-02 ENCOUNTER — Encounter (HOSPITAL_COMMUNITY): Payer: Self-pay

## 2017-09-02 ENCOUNTER — Encounter (HOSPITAL_COMMUNITY): Payer: Self-pay | Admitting: Anesthesiology

## 2017-09-02 ENCOUNTER — Other Ambulatory Visit: Payer: Self-pay

## 2017-09-02 ENCOUNTER — Inpatient Hospital Stay (HOSPITAL_COMMUNITY)
Admission: AD | Admit: 2017-09-02 | Discharge: 2017-09-02 | Disposition: A | Discharge status: Home / Self Care | Payer: BLUE CROSS/BLUE SHIELD | Source: Ambulatory Visit | Attending: Obstetrics & Gynecology | Admitting: Obstetrics & Gynecology

## 2017-09-02 DIAGNOSIS — R51 Headache: Secondary | ICD-10-CM | POA: Diagnosis not present

## 2017-09-02 DIAGNOSIS — Z882 Allergy status to sulfonamides status: Secondary | ICD-10-CM | POA: Diagnosis not present

## 2017-09-02 DIAGNOSIS — A0472 Enterocolitis due to Clostridium difficile, not specified as recurrent: Secondary | ICD-10-CM

## 2017-09-02 DIAGNOSIS — O98813 Other maternal infectious and parasitic diseases complicating pregnancy, third trimester: Secondary | ICD-10-CM | POA: Insufficient documentation

## 2017-09-02 DIAGNOSIS — R109 Unspecified abdominal pain: Secondary | ICD-10-CM

## 2017-09-02 DIAGNOSIS — R42 Dizziness and giddiness: Secondary | ICD-10-CM | POA: Insufficient documentation

## 2017-09-02 DIAGNOSIS — R197 Diarrhea, unspecified: Secondary | ICD-10-CM | POA: Diagnosis not present

## 2017-09-02 DIAGNOSIS — Z3A32 32 weeks gestation of pregnancy: Secondary | ICD-10-CM | POA: Diagnosis not present

## 2017-09-02 DIAGNOSIS — O26893 Other specified pregnancy related conditions, third trimester: Secondary | ICD-10-CM | POA: Insufficient documentation

## 2017-09-02 DIAGNOSIS — Z3689 Encounter for other specified antenatal screening: Secondary | ICD-10-CM

## 2017-09-02 LAB — COMPREHENSIVE METABOLIC PANEL
ALBUMIN: 3.3 g/dL — AB (ref 3.5–5.0)
ALK PHOS: 109 U/L (ref 38–126)
ALT: 10 U/L (ref 0–44)
ANION GAP: 10 (ref 5–15)
AST: 18 U/L (ref 15–41)
BUN: 7 mg/dL (ref 6–20)
CALCIUM: 9.1 mg/dL (ref 8.9–10.3)
CO2: 19 mmol/L — AB (ref 22–32)
Chloride: 105 mmol/L (ref 98–111)
Creatinine, Ser: 0.66 mg/dL (ref 0.44–1.00)
GFR calc Af Amer: >60 mL/min (ref >60)
GFR calc non Af Amer: >60 mL/min (ref >60)
Glucose, Bld: 77 mg/dL (ref 70–99)
POTASSIUM: 4 mmol/L (ref 3.5–5.1)
SODIUM: 134 mmol/L — AB (ref 135–145)
Total Bilirubin: 0.7 mg/dL (ref 0.3–1.2)
Total Protein: 6.7 g/dL (ref 6.5–8.1)

## 2017-09-02 LAB — URINALYSIS, ROUTINE W REFLEX MICROSCOPIC
Bilirubin Urine: NEGATIVE
Glucose, UA: NEGATIVE mg/dL
Hgb urine dipstick: NEGATIVE
Ketones, ur: NEGATIVE mg/dL
LEUKOCYTES UA: NEGATIVE
NITRITE: NEGATIVE
PH: 7 (ref 5.0–8.0)
Protein, ur: NEGATIVE mg/dL
SPECIFIC GRAVITY, URINE: 1.009 (ref 1.005–1.030)

## 2017-09-02 LAB — CBC
HCT: 37.9 % (ref 36.0–46.0)
HEMOGLOBIN: 12.9 g/dL (ref 12.0–15.0)
MCH: 30.6 pg (ref 26.0–34.0)
MCHC: 34 g/dL (ref 30.0–36.0)
MCV: 89.8 fL (ref 78.0–100.0)
Platelets: 231 10*3/uL (ref 150–400)
RBC: 4.22 MIL/uL (ref 3.87–5.11)
RDW: 13.2 % (ref 11.5–15.5)
WBC: 10.2 10*3/uL (ref 4.0–10.5)

## 2017-09-02 LAB — C-REACTIVE PROTEIN: CRP: 2.3 mg/dL — ABNORMAL HIGH (ref <1.0)

## 2017-09-02 MED ORDER — LACTATED RINGERS IV BOLUS
1000.0000 mL | Freq: Once | INTRAVENOUS | Status: AC
  Administered 2017-09-02: 1000 mL via INTRAVENOUS

## 2017-09-02 NOTE — MAU Provider Note (Signed)
History     CSN: [id number]  Arrival date and time: 09/02/17 1122   First Provider Initiated Contact with Patient 09/02/17 1154      Chief Complaint  Patient presents with  . Diarrhea  . Abdominal Pain  . Dizziness  . Headache   [id number] @32 .3 wks sent from office for IV hydration. She's been followed by GI for c.diff. Reports diarrhea restarted yesterday, 20 episodes in the last 24 hrs. She is tolerating po and hydrating well but her appetite has been poor. Reports good FM. No Vb or LOF. Having some irregular ctx.    OB History    Gravida  4   Para  3   Term  3   Preterm      AB      Living  3     SAB      TAB      Ectopic      Multiple  0   Live Births  3           Past Medical History:  Diagnosis Date  . Anxiety   . Chiari malformation type I ([place])    MRI [facility] 2013  . Depression    PPD with 2nd baby  . Endometriosis 1997  . Headache   . Headache in pregnancy   . Hearing loss   . Kidney infection   . Kidney stones   . Otosclerosis 2008  . Otosclerosis     Past Surgical History:  Procedure Laterality Date  . DILATION AND CURETTAGE OF UTERUS  May 2015  . LAPAROSCOPIC ABDOMINAL EXPLORATION  06/2013   has had 4 surgeries  . SHOULDER SURGERY  2000  . TONSILLECTOMY AND ADENOIDECTOMY  2010  . WISDOM TOOTH EXTRACTION  2008    Family History  Problem Relation Age of Onset  . Asthma Father   . Arthritis Father        Rheumatoid  . Cancer Maternal Grandmother        skin  . Hypertension Other   . Hyperlipidemia Other   . Stroke Other   . Obesity Other   . Aneurysm Paternal Grandfather        brain  . Skin cancer Paternal Grandmother   . Migraines Neg Hx     Social History   Tobacco Use  . Smoking status: Former Smoker    Types: Cigarettes    Last attempt to quit: 11/25/2001    Years since quitting: 15.7  . Smokeless tobacco: Never Used  Substance Use Topics  . Alcohol use: Not Currently    Alcohol/week: 0.6 oz    Types: 1  Glasses of wine per week    Comment: social events  . Drug use: No    Allergies:  Allergies  Allergen Reactions  . Shellfish Allergy Hives  . Verapamil     GI sx, weakness  . Other     Surgical glue causes a rash  . Sulfa Antibiotics Dermatitis and Rash    No medications prior to admission.    Review of Systems  Gastrointestinal: Positive for diarrhea. Negative for abdominal pain.  Genitourinary: Negative for vaginal bleeding and vaginal discharge.   Physical Exam   Blood pressure 118/72, pulse 92, temperature 98.4 F (36.9 C), temperature source Oral, resp. rate 18, last menstrual period 09/05/2016, unknown if currently breastfeeding.  Physical Exam  Constitutional: She is oriented to person, place, and time. She appears well-developed and well-nourished. No distress.  HENT:  Head: Normocephalic and  atraumatic.  Neck: Normal range of motion.  Cardiovascular: Normal rate.  Respiratory: Effort normal. No respiratory distress.  GI: Soft. She exhibits no distension. There is no tenderness.  gravid  Musculoskeletal: Normal range of motion.  Neurological: She is alert and oriented to person, place, and time.  Skin: Skin is warm and dry.  Psychiatric: She has a normal mood and affect.  EFM: 140 bpm, mod variability, + accels, no decels Toco: none  Results for orders placed or performed during the hospital encounter of 09/02/17 (from the past 24 hour(s))  CBC     Status: None   Collection Time: 09/02/17 11:41 AM  Result Value Ref Range   WBC 10.2 4.0 - 10.5 K/uL   RBC 4.22 3.87 - 5.11 MIL/uL   Hemoglobin 12.9 12.0 - 15.0 g/dL   HCT 37.9 36.0 - 46.0 %   MCV 89.8 78.0 - 100.0 fL   MCH 30.6 26.0 - 34.0 pg   MCHC 34.0 30.0 - 36.0 g/dL   RDW 13.2 11.5 - 15.5 %   Platelets 231 150 - 400 K/uL  Comprehensive metabolic panel     Status: Abnormal   Collection Time: 09/02/17 11:41 AM  Result Value Ref Range   Sodium 134 (L) 135 - 145 mmol/L   Potassium 4.0 3.5 - 5.1 mmol/L    Chloride 105 98 - 111 mmol/L   CO2 19 (L) 22 - 32 mmol/L   Glucose, Bld 77 70 - 99 mg/dL   BUN 7 6 - 20 mg/dL   Creatinine, Ser 0.66 0.44 - 1.00 mg/dL   Calcium 9.1 8.9 - 10.3 mg/dL   Total Protein 6.7 6.5 - 8.1 g/dL   Albumin 3.3 (L) 3.5 - 5.0 g/dL   AST 18 15 - 41 U/L   ALT 10 0 - 44 U/L   Alkaline Phosphatase 109 38 - 126 U/L   Total Bilirubin 0.7 0.3 - 1.2 mg/dL   GFR calc non Af Amer >60 >60 mL/min   GFR calc Af Amer >60 >60 mL/min   Anion gap 10 5 - 15  Urinalysis, Routine w reflex microscopic     Status: None   Collection Time: 09/02/17 12:35 PM  Result Value Ref Range   Color, Urine YELLOW YELLOW   APPearance CLEAR CLEAR   Specific Gravity, Urine 1.009 1.005 - 1.030   pH 7.0 5.0 - 8.0   Glucose, UA NEGATIVE NEGATIVE mg/dL   Hgb urine dipstick NEGATIVE NEGATIVE   Bilirubin Urine NEGATIVE NEGATIVE   Ketones, ur NEGATIVE NEGATIVE mg/dL   Protein, ur NEGATIVE NEGATIVE mg/dL   Nitrite NEGATIVE NEGATIVE   Leukocytes, UA NEGATIVE NEGATIVE   MAU Course  Procedures LR 1L MDM Labs ordered and reviewed. No dehydration noted. Presentation, clinical findings, and plan discussed with Dr. Gaetano Net. Stable for discharge home.  Assessment and Plan   1. [redacted] weeks gestation of pregnancy   2. NST (non-stress test) reactive   3. C. difficile diarrhea    Discharge home Follow up in OB office as scheduled Hydrate Restart Vancomycin as instructed  Allergies as of 09/02/2017      Reactions   Shellfish Allergy Hives   Verapamil    GI sx, weakness   Other    Surgical glue causes a rash   Sulfa Antibiotics Dermatitis, Rash      Medication List    You have not been prescribed any medications.    Julianne Handler, CNM 09/02/2017, 12:09 PM

## 2017-09-02 NOTE — Discharge Instructions (Signed)
Clostridium Difficile Infection Clostridium difficile (C. difficile or C. diff) infection causes inflammation of the large intestine (colon). This condition can result in damage to the lining of your colon and may lead to another condition called colitis. This infection can be passed from person to person (is contagious). Follow these instructions at home: Eating and drinking Drink enough fluid to keep your pee (urine) clear or pale yellow. Avoid drinking: Milk. Caffeine. Alcohol. Follow exact instructions from your doctor about how to get enough fluid in your body (rehydrate). Eat small meals often instead of large meals. Medicines Take your antibiotic medicine as told by your doctor. Do not stop taking the antibiotic even if you start to feel better unless your doctor told you to do that. Take over-the-counter and prescription medicines only as told by your doctor. Do not use medicines to help with watery poop (diarrhea). General instructions Wash your hands fully before you prepare food and after you use the bathroom. Make sure people who live with you also wash their hands often. Clean the surfaces that you touch. Use a product that contains chlorine bleach. Keep all follow-up visits as told by your doctor. This is important. Contact a doctor if: Your symptoms do not get better with treatment. Your symptoms get worse with treatment. Your symptoms go away and then come back. You have a fever. You have new symptoms. Get help right away if: You have more pain or tenderness in your belly (abdomen). Your poop (stool) is mostly bloody. Your poop looks dark black and tarry. You cannot eat or drink without throwing up (vomiting). You have signs of dehydration, such as: Dark pee, very little pee, or no pee. Cracked lips. Not making tears when you cry. Dry mouth. Sunken eyes. Feeling sleepy. Feeling weak. Feeling dizzy. This information is not intended to replace advice given to  you by your health care provider. Make sure you discuss any questions you have with your health care provider. Document Released: 11/30/2008 Document Revised: 07/11/2015 Document Reviewed: 08/06/2014 Elsevier Interactive Patient Education  2017 [employer].  Follow these recommendations as told by your doctor:  Take an oral rehydration solution (ORS). This is a drink that is sold at pharmacies and stores.  Drink clear fluids, such as: ? Water. ? Ice chips. ? Diluted fruit juice. ? Low-calorie sports drinks.  Eat bland, easy-to-digest foods in small amounts as you are able. These foods include: ? Bananas. ? Applesauce. ? Rice. ? Low-fat (lean) meats. ? Toast. ? Crackers.  Avoid drinking fluids that have a lot of sugar or caffeine in them.  Avoid alcohol.  Avoid spicy or fatty foods.  General instructions   Drink enough fluid to keep your pee (urine) clear or pale yellow.  Wash your hands often. If you cannot use soap and water, use hand sanitizer.  Make sure that all people in your home wash their hands well and often.  Take over-the-counter and prescription medicines only as told by your doctor.  Rest at home while you get better.  Watch your condition for any changes.  Take a warm bath to help with any burning or pain from having diarrhea.  Keep all follow-up visits as told by your doctor. This is important. Contact a doctor if:  You have a fever.  Your diarrhea gets worse.  You have new symptoms.  You cannot keep fluids down.  You feel light-headed or dizzy.  You have a headache.  You have muscle cramps. Get help right away if:  You have chest pain.  You feel very weak or you pass out (faint).  You have bloody or black poop or poop that look like tar.  You have very bad pain, cramping, or bloating in your belly (abdomen).  You have trouble breathing or you are breathing very quickly.  Your heart is beating very quickly.  Your skin feels  cold and clammy.  You feel confused.  You have signs of dehydration, such as: ? Dark pee, hardly any pee, or no pee. ? Cracked lips. ? Dry mouth. ? Sunken eyes. ? Sleepiness. ? Weakness. This information is not intended to replace advice given to you by your health care provider. Make sure you discuss any questions you have with your health care provider. Document Released: 07/22/2007 Document Revised: 08/23/2015 Document Reviewed: 10/09/2014 Elsevier Interactive Patient Education  2018 [employer].

## 2017-09-02 NOTE — MAU Note (Signed)
Urine in lab, not enough for culture

## 2017-09-02 NOTE — MAU Note (Signed)
Pt presents to MAU with c/o diarrhea, finished antibiotic for Cdiff on 08/23/2017. Pt has also been having  abdominal pain x 1 day, and dizzniess/headaches x 1 week. Pt denies VB and LOF. +FM

## 2017-09-05 ENCOUNTER — Other Ambulatory Visit: Payer: Self-pay

## 2017-09-05 ENCOUNTER — Inpatient Hospital Stay (HOSPITAL_COMMUNITY)
Admission: AD | Admit: 2017-09-05 | Discharge: 2017-09-05 | Disposition: A | Discharge status: Home / Self Care | Payer: BLUE CROSS/BLUE SHIELD | Source: Ambulatory Visit | Attending: Obstetrics and Gynecology | Admitting: Obstetrics and Gynecology

## 2017-09-05 ENCOUNTER — Encounter (HOSPITAL_COMMUNITY): Payer: Self-pay

## 2017-09-05 DIAGNOSIS — F329 Major depressive disorder, single episode, unspecified: Secondary | ICD-10-CM | POA: Diagnosis not present

## 2017-09-05 DIAGNOSIS — Z87891 Personal history of nicotine dependence: Secondary | ICD-10-CM | POA: Diagnosis not present

## 2017-09-05 DIAGNOSIS — O98813 Other maternal infectious and parasitic diseases complicating pregnancy, third trimester: Secondary | ICD-10-CM | POA: Diagnosis not present

## 2017-09-05 DIAGNOSIS — Z91013 Allergy to seafood: Secondary | ICD-10-CM | POA: Diagnosis not present

## 2017-09-05 DIAGNOSIS — Z3A32 32 weeks gestation of pregnancy: Secondary | ICD-10-CM | POA: Diagnosis not present

## 2017-09-05 DIAGNOSIS — Z882 Allergy status to sulfonamides status: Secondary | ICD-10-CM | POA: Insufficient documentation

## 2017-09-05 DIAGNOSIS — A0471 Enterocolitis due to Clostridium difficile, recurrent: Secondary | ICD-10-CM | POA: Diagnosis not present

## 2017-09-05 DIAGNOSIS — O99713 Diseases of the skin and subcutaneous tissue complicating pregnancy, third trimester: Secondary | ICD-10-CM | POA: Insufficient documentation

## 2017-09-05 DIAGNOSIS — R21 Rash and other nonspecific skin eruption: Secondary | ICD-10-CM | POA: Diagnosis not present

## 2017-09-05 DIAGNOSIS — O9989 Other specified diseases and conditions complicating pregnancy, childbirth and the puerperium: Secondary | ICD-10-CM | POA: Diagnosis not present

## 2017-09-05 DIAGNOSIS — K146 Glossodynia: Secondary | ICD-10-CM | POA: Diagnosis not present

## 2017-09-05 DIAGNOSIS — K142 Median rhomboid glossitis: Secondary | ICD-10-CM | POA: Diagnosis not present

## 2017-09-05 DIAGNOSIS — L511 Stevens-Johnson syndrome: Secondary | ICD-10-CM | POA: Diagnosis not present

## 2017-09-05 DIAGNOSIS — O26893 Other specified pregnancy related conditions, third trimester: Secondary | ICD-10-CM | POA: Diagnosis not present

## 2017-09-05 DIAGNOSIS — F419 Anxiety disorder, unspecified: Secondary | ICD-10-CM | POA: Diagnosis not present

## 2017-09-05 DIAGNOSIS — N132 Hydronephrosis with renal and ureteral calculous obstruction: Secondary | ICD-10-CM | POA: Diagnosis not present

## 2017-09-05 DIAGNOSIS — O99613 Diseases of the digestive system complicating pregnancy, third trimester: Secondary | ICD-10-CM | POA: Diagnosis not present

## 2017-09-05 DIAGNOSIS — Z3A33 33 weeks gestation of pregnancy: Secondary | ICD-10-CM | POA: Diagnosis not present

## 2017-09-05 DIAGNOSIS — G43109 Migraine with aura, not intractable, without status migrainosus: Secondary | ICD-10-CM | POA: Diagnosis not present

## 2017-09-05 DIAGNOSIS — O99343 Other mental disorders complicating pregnancy, third trimester: Secondary | ICD-10-CM | POA: Diagnosis not present

## 2017-09-05 DIAGNOSIS — Z8669 Personal history of other diseases of the nervous system and sense organs: Secondary | ICD-10-CM | POA: Diagnosis not present

## 2017-09-05 DIAGNOSIS — Z6827 Body mass index (BMI) 27.0-27.9, adult: Secondary | ICD-10-CM | POA: Diagnosis not present

## 2017-09-05 DIAGNOSIS — O99353 Diseases of the nervous system complicating pregnancy, third trimester: Secondary | ICD-10-CM | POA: Diagnosis not present

## 2017-09-05 LAB — URINALYSIS, ROUTINE W REFLEX MICROSCOPIC
Bilirubin Urine: NEGATIVE
GLUCOSE, UA: NEGATIVE mg/dL
Hgb urine dipstick: NEGATIVE
Ketones, ur: NEGATIVE mg/dL
LEUKOCYTES UA: NEGATIVE
NITRITE: NEGATIVE
PH: 7 (ref 5.0–8.0)
Protein, ur: NEGATIVE mg/dL
SPECIFIC GRAVITY, URINE: 1.002 — AB (ref 1.005–1.030)

## 2017-09-05 MED ORDER — MAGIC MOUTHWASH W/LIDOCAINE: 5.0000 mL | Freq: Once | ORAL | Status: DC

## 2017-09-05 MED ORDER — LIDOCAINE VISCOUS HCL 2 % MT SOLN
15.0000 mL | Freq: Once | OROMUCOSAL | Status: AC
  Administered 2017-09-05: 15 mL via OROMUCOSAL
  Filled 2017-09-05: qty 15

## 2017-09-05 MED ORDER — NYSTATIN 100000 UNIT/ML MT SUSP
5.0000 mL | Freq: Four times a day (QID) | OROMUCOSAL | Status: DC
  Administered 2017-09-05: 500000 [IU] via ORAL
  Filled 2017-09-05 (×3): qty 5

## 2017-09-05 NOTE — Progress Notes (Addendum)
G4P3 @ 32.[redacted] wksga. Presents to triage for allergic rxn in the mouth. Noted sores on the tongue. Pt states noticed them on Friday and progressively got worse. Sore gums. Rash on shoulder areas btw two episodes f  being on Vancomycin.   Denies LOF or bleeding. + FM. Feels ctx off and on but not consistant.  Past Thursday dx with C-Diff again and on Vancomycin.   Hx: AMA C-diff infection x2 Hx preterm labor Depression/anxiety - takes Zoloft Migraines - no meds but states zoloft helps Kidney stones/infection Endometriosis  Sx: D&C and 4 lap procedures. See sx hx for other sx.   1947: provider at bs assessing pt.   1955: Pt was in upright position. Readjusted monitor and toco UA referenced.   BP 126/77   Pulse 84   Temp 98.4 F (36.9 C) (Oral)   Resp 18   LMP 09/05/2016 (Exact Date)   SpO2 99%    2012: pharmacy notified of med order. Informed not available. Provider made aware.   2014: pharmacy notified again if they have anything to treat yeast of the mouth. Informed have Nystatin mouth wash. Provider made aware.   2052: mouth wash medication administered. Informed pt to swish in mouth and spit. Bag provided.   2100: Dr. Helane Rima at bs discussing Kay. Informed pt she can drive to [facility] ED. Informed that Provider there are expecting pt.   2103: d/c instructions received from Dr. Helane Rima.   2115 D/c instructions given with pt understanding. Pt left unit via ambulatory.

## 2017-09-05 NOTE — MAU Provider Note (Signed)
History     CSN: [id number]  Arrival date and time: 09/05/17 3474   First Provider Initiated Contact with Patient 09/05/17 1945      Chief Complaint  Patient presents with  . tongue pain   Ellen Vasquez is a 42 y.o. G4P3003 at [redacted]w[redacted]d who presents today burning and itching on her tongue and a rash on her shoulders. She was treated for infectious E. Coli, then developed c. Diff. She was treated for c.diff it then it relapsed and she is currently on another 6 weeks of vancomycin. She reports that she has now developed burning and itching on her tongue, and rash on her shoulders. She states that she has had the tongue rash for a couple of day, and she has had the rash on her arms for approx 10 days. She was seen by her OB in the office last week, and was given a steroid cream for the rash on her shoulders. She reports that this has not helped, and that the rash has continued to spread/increase. She denies any OB complaints other than occasional contractions. She denies VB or LOF. She reports normal fetal movement.   Past Medical History:  Diagnosis Date  . Anxiety    zoloft 150 mg   . Chiari malformation type I ([place])    MRI [facility] 2013  . Depression    PPD with 2nd and 3rd baby (zoloft)  . Endometriosis 1997  . Headache    no meds   . Headache in pregnancy   . Hearing loss   . Kidney infection   . Kidney stones    current pregnancy   . Otosclerosis 2008  . Otosclerosis     Past Surgical History:  Procedure Laterality Date  . DILATION AND CURETTAGE OF UTERUS  May 2015  . LAPAROSCOPIC ABDOMINAL EXPLORATION  06/2013   has had 4 surgeries  . SHOULDER SURGERY  2000  . TONSILLECTOMY AND ADENOIDECTOMY  2010  . WISDOM TOOTH EXTRACTION  2008    Family History  Problem Relation Age of Onset  . Asthma Father   . Arthritis Father        Rheumatoid  . Cancer Maternal Grandmother        skin  . Hypertension Other   . Hyperlipidemia Other   . Stroke Other   . Obesity Other    . Aneurysm Paternal Grandfather        brain  . Skin cancer Paternal Grandmother   . Migraines Neg Hx     Social History   Tobacco Use  . Smoking status: Former Smoker    Types: Cigarettes    Last attempt to quit: 11/25/2001    Years since quitting: 15.7  . Smokeless tobacco: Never Used  Substance Use Topics  . Alcohol use: Not Currently    Alcohol/week: 0.6 oz    Types: 1 Glasses of wine per week    Comment: social events  . Drug use: No    Allergies:  Allergies  Allergen Reactions  . Shellfish Allergy Hives  . Verapamil     GI sx, weakness  . Other     Surgical glue causes a rash  . Sulfa Antibiotics Dermatitis and Rash    No medications prior to admission.    Review of Systems  Constitutional: Negative for chills and fever.  Respiratory: Negative for chest tightness and shortness of breath.   Gastrointestinal: Negative for nausea and vomiting.  Genitourinary: Negative for pelvic pain, vaginal bleeding and vaginal  discharge.  Skin: Positive for rash.   Physical Exam   Last menstrual period 09/05/2016, unknown if currently breastfeeding.  Physical Exam  Nursing note and vitals reviewed. Constitutional: She is oriented to person, place, and time. She appears well-developed and well-nourished. No distress.  HENT:  Head: Normocephalic.  Thrush noted on tongue   Cardiovascular: Normal rate.  Respiratory: Effort normal.  GI: Soft. There is no tenderness. There is no rebound.  Genitourinary:  Genitourinary Comments: Cervix: closed/thick/posterior   Neurological: She is alert and oriented to person, place, and time.  Skin: Skin is warm and dry. Rash noted. There is erythema.  Ulcerated rash on each shoulder, with diffuse erythema on shoulders, chest and back.    NST:  Baseline: 140 Variability: moderate Accels: 15x15 Decels: none Toco: occasional    MAU Course  Procedures  MDM 8:38 PM Consult with Dr. Helane Rima. She will come see the patient as there  is concern that this is a potentially serious reaction.   8:39 PM Dr. Helane Rima has seen the patient and agree that this could be SJS. She will contact [facility] to see if she can be transferred there.    Assessment and Plan   1. Stevens-Johnson syndrome ([place])    Dr. Helane Rima assumes care of the patient She is arranging transport to [facility] 09/05/2017, 7:48 PM

## 2017-09-05 NOTE — Progress Notes (Signed)
42 year old G 4 P 3003 at 32 weeks and 6 days presents with ulcers on tongue and rashes on arms .   The tongue ulcerations  developed on Friday after starting 2nd course of Vancomycin on Thursday .  The patient is on Vancomycin for recurrent C Difficile.  She also reports 10 - 12 days of a rash most notably on outer arms. New ones are forming and they are painful.  This is her second course of Vancomycin.  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

## 2017-09-06 DIAGNOSIS — O09523 Supervision of elderly multigravida, third trimester: Secondary | ICD-10-CM | POA: Diagnosis not present

## 2017-09-06 DIAGNOSIS — L309 Dermatitis, unspecified: Secondary | ICD-10-CM | POA: Diagnosis not present

## 2017-09-06 DIAGNOSIS — R238 Other skin changes: Secondary | ICD-10-CM | POA: Diagnosis not present

## 2017-09-06 DIAGNOSIS — A0471 Enterocolitis due to Clostridium difficile, recurrent: Secondary | ICD-10-CM | POA: Diagnosis not present

## 2017-09-06 DIAGNOSIS — O98813 Other maternal infectious and parasitic diseases complicating pregnancy, third trimester: Secondary | ICD-10-CM | POA: Diagnosis not present

## 2017-09-06 DIAGNOSIS — Z3A33 33 weeks gestation of pregnancy: Secondary | ICD-10-CM | POA: Diagnosis not present

## 2017-09-06 DIAGNOSIS — L538 Other specified erythematous conditions: Secondary | ICD-10-CM | POA: Diagnosis not present

## 2017-09-06 DIAGNOSIS — Z331 Pregnant state, incidental: Secondary | ICD-10-CM | POA: Diagnosis not present

## 2017-09-06 MED ORDER — SERTRALINE HCL 50 MG PO TABS
150.00 | ORAL_TABLET | ORAL | Status: DC
Start: 2017-09-07 — End: 2017-09-06

## 2017-09-06 MED ORDER — ONDANSETRON 4 MG PO TBDP
4.00 | ORAL_TABLET | ORAL | Status: DC
Start: ? — End: 2017-09-06

## 2017-09-06 MED ORDER — DOCUSATE SODIUM 100 MG PO CAPS
100.00 | ORAL_CAPSULE | ORAL | Status: DC
Start: ? — End: 2017-09-06

## 2017-09-06 MED ORDER — ALUM & MAG HYDROXIDE-SIMETH 400-400-40 MG/5ML PO SUSP
10.00 | ORAL | Status: DC
Start: ? — End: 2017-09-06

## 2017-09-06 MED ORDER — ACETAMINOPHEN 325 MG PO TABS
650.00 | ORAL_TABLET | ORAL | Status: DC
Start: ? — End: 2017-09-06

## 2017-09-06 MED ORDER — ZOLPIDEM TARTRATE 5 MG PO TABS
5.00 | ORAL_TABLET | ORAL | Status: DC
Start: ? — End: 2017-09-06

## 2017-09-06 MED ORDER — FIDAXOMICIN 200 MG PO TABS
200.00 | ORAL_TABLET | ORAL | Status: DC
Start: 2017-09-07 — End: 2017-09-06

## 2017-09-06 MED ORDER — PNV PRENATAL PLUS MULTIVITAMIN 27-1 MG PO TABS
1.00 | ORAL_TABLET | ORAL | Status: DC
Start: 2017-09-08 — End: 2017-09-06

## 2017-09-07 DIAGNOSIS — Z3A33 33 weeks gestation of pregnancy: Secondary | ICD-10-CM | POA: Diagnosis not present

## 2017-09-07 DIAGNOSIS — A0471 Enterocolitis due to Clostridium difficile, recurrent: Secondary | ICD-10-CM | POA: Diagnosis not present

## 2017-09-07 DIAGNOSIS — Z331 Pregnant state, incidental: Secondary | ICD-10-CM | POA: Diagnosis not present

## 2017-09-08 MED ORDER — FLUOCINONIDE 0.05 % EX OINT
TOPICAL_OINTMENT | CUTANEOUS | Status: DC
Start: 2017-09-07 — End: 2017-09-08

## 2017-09-08 MED ORDER — NYSTATIN 100000 UNIT/ML MT SUSP
500000.00 | OROMUCOSAL | Status: DC
Start: 2017-09-07 — End: 2017-09-08

## 2017-09-13 DIAGNOSIS — Z3A34 34 weeks gestation of pregnancy: Secondary | ICD-10-CM | POA: Diagnosis not present

## 2017-09-13 DIAGNOSIS — O9989 Other specified diseases and conditions complicating pregnancy, childbirth and the puerperium: Secondary | ICD-10-CM | POA: Diagnosis not present

## 2017-09-14 DIAGNOSIS — R197 Diarrhea, unspecified: Secondary | ICD-10-CM | POA: Diagnosis not present

## 2017-09-20 DIAGNOSIS — R197 Diarrhea, unspecified: Secondary | ICD-10-CM | POA: Diagnosis not present

## 2017-09-20 DIAGNOSIS — Z6828 Body mass index (BMI) 28.0-28.9, adult: Secondary | ICD-10-CM | POA: Diagnosis not present

## 2017-09-21 DIAGNOSIS — O26893 Other specified pregnancy related conditions, third trimester: Secondary | ICD-10-CM | POA: Diagnosis not present

## 2017-09-21 DIAGNOSIS — Z3A35 35 weeks gestation of pregnancy: Secondary | ICD-10-CM | POA: Diagnosis not present

## 2017-09-21 DIAGNOSIS — Z3480 Encounter for supervision of other normal pregnancy, unspecified trimester: Secondary | ICD-10-CM | POA: Diagnosis not present

## 2017-09-29 ENCOUNTER — Other Ambulatory Visit: Payer: Self-pay

## 2017-09-29 ENCOUNTER — Inpatient Hospital Stay (HOSPITAL_COMMUNITY)
Admission: AD | Admit: 2017-09-29 | Discharge: 2017-10-06 | DRG: 796 | Disposition: A | Discharge status: Home / Self Care | Payer: BLUE CROSS/BLUE SHIELD | Attending: Obstetrics and Gynecology | Admitting: Obstetrics and Gynecology

## 2017-09-29 ENCOUNTER — Encounter (HOSPITAL_COMMUNITY): Payer: Self-pay

## 2017-09-29 DIAGNOSIS — O9989 Other specified diseases and conditions complicating pregnancy, childbirth and the puerperium: Secondary | ICD-10-CM | POA: Diagnosis not present

## 2017-09-29 DIAGNOSIS — D649 Anemia, unspecified: Secondary | ICD-10-CM | POA: Diagnosis not present

## 2017-09-29 DIAGNOSIS — Z3A37 37 weeks gestation of pregnancy: Secondary | ICD-10-CM | POA: Diagnosis not present

## 2017-09-29 DIAGNOSIS — R42 Dizziness and giddiness: Secondary | ICD-10-CM

## 2017-09-29 DIAGNOSIS — Z87891 Personal history of nicotine dependence: Secondary | ICD-10-CM | POA: Diagnosis not present

## 2017-09-29 DIAGNOSIS — O9962 Diseases of the digestive system complicating childbirth: Secondary | ICD-10-CM | POA: Diagnosis not present

## 2017-09-29 DIAGNOSIS — R197 Diarrhea, unspecified: Secondary | ICD-10-CM

## 2017-09-29 DIAGNOSIS — O98813 Other maternal infectious and parasitic diseases complicating pregnancy, third trimester: Secondary | ICD-10-CM | POA: Diagnosis not present

## 2017-09-29 DIAGNOSIS — B9689 Other specified bacterial agents as the cause of diseases classified elsewhere: Secondary | ICD-10-CM | POA: Diagnosis not present

## 2017-09-29 DIAGNOSIS — R002 Palpitations: Secondary | ICD-10-CM | POA: Diagnosis present

## 2017-09-29 DIAGNOSIS — O9942 Diseases of the circulatory system complicating childbirth: Secondary | ICD-10-CM | POA: Diagnosis not present

## 2017-09-29 DIAGNOSIS — I493 Ventricular premature depolarization: Secondary | ICD-10-CM | POA: Diagnosis not present

## 2017-09-29 DIAGNOSIS — F419 Anxiety disorder, unspecified: Secondary | ICD-10-CM | POA: Diagnosis present

## 2017-09-29 DIAGNOSIS — A0471 Enterocolitis due to Clostridium difficile, recurrent: Secondary | ICD-10-CM | POA: Diagnosis present

## 2017-09-29 DIAGNOSIS — G935 Compression of brain: Secondary | ICD-10-CM | POA: Diagnosis not present

## 2017-09-29 DIAGNOSIS — A0472 Enterocolitis due to Clostridium difficile, not specified as recurrent: Secondary | ICD-10-CM | POA: Diagnosis not present

## 2017-09-29 DIAGNOSIS — Z3A36 36 weeks gestation of pregnancy: Secondary | ICD-10-CM

## 2017-09-29 DIAGNOSIS — Z3A00 Weeks of gestation of pregnancy not specified: Secondary | ICD-10-CM | POA: Diagnosis not present

## 2017-09-29 DIAGNOSIS — Z302 Encounter for sterilization: Secondary | ICD-10-CM | POA: Diagnosis not present

## 2017-09-29 DIAGNOSIS — O99344 Other mental disorders complicating childbirth: Secondary | ICD-10-CM | POA: Diagnosis present

## 2017-09-29 DIAGNOSIS — O26893 Other specified pregnancy related conditions, third trimester: Secondary | ICD-10-CM | POA: Diagnosis not present

## 2017-09-29 DIAGNOSIS — O99354 Diseases of the nervous system complicating childbirth: Secondary | ICD-10-CM | POA: Diagnosis present

## 2017-09-29 DIAGNOSIS — O9902 Anemia complicating childbirth: Secondary | ICD-10-CM | POA: Diagnosis present

## 2017-09-29 DIAGNOSIS — Z3A35 35 weeks gestation of pregnancy: Secondary | ICD-10-CM | POA: Diagnosis not present

## 2017-09-29 DIAGNOSIS — K219 Gastro-esophageal reflux disease without esophagitis: Secondary | ICD-10-CM | POA: Diagnosis not present

## 2017-09-29 DIAGNOSIS — Z3480 Encounter for supervision of other normal pregnancy, unspecified trimester: Secondary | ICD-10-CM | POA: Diagnosis not present

## 2017-09-29 DIAGNOSIS — O2693 Pregnancy related conditions, unspecified, third trimester: Secondary | ICD-10-CM | POA: Diagnosis not present

## 2017-09-29 LAB — MAGNESIUM: MAGNESIUM: 2.1 mg/dL (ref 1.7–2.4)

## 2017-09-29 LAB — URINALYSIS, ROUTINE W REFLEX MICROSCOPIC
Bilirubin Urine: NEGATIVE
GLUCOSE, UA: NEGATIVE mg/dL
Hgb urine dipstick: NEGATIVE
Ketones, ur: NEGATIVE mg/dL
LEUKOCYTES UA: NEGATIVE
Nitrite: NEGATIVE
Protein, ur: NEGATIVE mg/dL
SPECIFIC GRAVITY, URINE: 1.002 — AB (ref 1.005–1.030)
pH: 7 (ref 5.0–8.0)

## 2017-09-29 LAB — CBC WITH DIFFERENTIAL/PLATELET
BASOS PCT: 0 %
Basophils Absolute: 0 10*3/uL (ref 0.0–0.1)
EOS ABS: 0.1 10*3/uL (ref 0.0–0.7)
Eosinophils Relative: 1 %
HCT: 37.4 % (ref 36.0–46.0)
HEMOGLOBIN: 12.5 g/dL (ref 12.0–15.0)
LYMPHS ABS: 2.1 10*3/uL (ref 0.7–4.0)
Lymphocytes Relative: 17 %
MCH: 29.7 pg (ref 26.0–34.0)
MCHC: 33.4 g/dL (ref 30.0–36.0)
MCV: 88.8 fL (ref 78.0–100.0)
MONO ABS: 0.6 10*3/uL (ref 0.1–1.0)
MONOS PCT: 5 %
Neutro Abs: 9.6 10*3/uL — ABNORMAL HIGH (ref 1.7–7.7)
Neutrophils Relative %: 77 %
Platelets: 226 10*3/uL (ref 150–400)
RBC: 4.21 MIL/uL (ref 3.87–5.11)
RDW: 13.1 % (ref 11.5–15.5)
WBC: 12.4 10*3/uL — ABNORMAL HIGH (ref 4.0–10.5)

## 2017-09-29 LAB — COMPREHENSIVE METABOLIC PANEL
ALBUMIN: 3.3 g/dL — AB (ref 3.5–5.0)
ALK PHOS: 136 U/L — AB (ref 38–126)
ALT: 12 U/L (ref 0–44)
AST: 19 U/L (ref 15–41)
Anion gap: 11 (ref 5–15)
BUN: 6 mg/dL (ref 6–20)
CALCIUM: 9.3 mg/dL (ref 8.9–10.3)
CHLORIDE: 104 mmol/L (ref 98–111)
CO2: 20 mmol/L — AB (ref 22–32)
CREATININE: 0.63 mg/dL (ref 0.44–1.00)
GFR calc Af Amer: >60 mL/min (ref >60)
GFR calc non Af Amer: >60 mL/min (ref >60)
GLUCOSE: 75 mg/dL (ref 70–99)
Potassium: 4.1 mmol/L (ref 3.5–5.1)
SODIUM: 135 mmol/L (ref 135–145)
Total Bilirubin: 0.7 mg/dL (ref 0.3–1.2)
Total Protein: 6.2 g/dL — ABNORMAL LOW (ref 6.5–8.1)

## 2017-09-29 MED ORDER — VANCOMYCIN 50 MG/ML ORAL SOLUTION: 125.0000 mg | Freq: Every day | ORAL | Status: DC

## 2017-09-29 MED ORDER — PROMETHAZINE HCL 25 MG PO TABS: 25.0000 mg | ORAL_TABLET | Freq: Four times a day (QID) | ORAL | Status: DC | PRN

## 2017-09-29 MED ORDER — ENSURE ENLIVE PO LIQD
2.0000 | Freq: Two times a day (BID) | ORAL | Status: DC
  Filled 2017-09-29: qty 474

## 2017-09-29 MED ORDER — VANCOMYCIN 50 MG/ML ORAL SOLUTION
125.0000 mg | Freq: Four times a day (QID) | ORAL | Status: AC
  Administered 2017-09-29 – 2017-09-30 (×4): 125 mg via ORAL
  Filled 2017-09-29 (×4): qty 2.5

## 2017-09-29 MED ORDER — DOCUSATE SODIUM 100 MG PO CAPS
100.0000 mg | ORAL_CAPSULE | Freq: Every day | ORAL | Status: DC
  Filled 2017-09-29: qty 1

## 2017-09-29 MED ORDER — ZOLPIDEM TARTRATE 5 MG PO TABS
5.0000 mg | ORAL_TABLET | Freq: Every evening | ORAL | Status: DC | PRN
  Administered 2017-09-29 – 2017-10-04 (×5): 5 mg via ORAL
  Filled 2017-09-29 (×4): qty 1

## 2017-09-29 MED ORDER — VANCOMYCIN 50 MG/ML ORAL SOLUTION
125.0000 mg | Freq: Two times a day (BID) | ORAL | Status: DC
  Administered 2017-10-01 – 2017-10-04 (×7): 125 mg via ORAL
  Filled 2017-09-29 (×9): qty 2.5

## 2017-09-29 MED ORDER — SERTRALINE HCL 100 MG PO TABS
100.0000 mg | ORAL_TABLET | Freq: Every day | ORAL | Status: DC
  Administered 2017-09-29 – 2017-10-03 (×5): 100 mg via ORAL
  Filled 2017-09-29 (×7): qty 1

## 2017-09-29 MED ORDER — ENSURE ENLIVE PO LIQD
1.0000 | Freq: Four times a day (QID) | ORAL | Status: DC
Start: 2017-09-29 — End: 2017-10-04
  Administered 2017-09-29 – 2017-10-03 (×15): 237 mL via ORAL
  Filled 2017-09-29 (×23): qty 237

## 2017-09-29 MED ORDER — CALCIUM CARBONATE ANTACID 500 MG PO CHEW: 2.0000 | CHEWABLE_TABLET | ORAL | Status: DC | PRN

## 2017-09-29 MED ORDER — ONDANSETRON 4 MG PO TBDP
4.0000 mg | ORAL_TABLET | Freq: Three times a day (TID) | ORAL | Status: DC | PRN
  Filled 2017-09-29: qty 1

## 2017-09-29 MED ORDER — FAMOTIDINE 20 MG PO TABS
20.0000 mg | ORAL_TABLET | Freq: Two times a day (BID) | ORAL | Status: DC
  Administered 2017-09-29 – 2017-10-04 (×11): 20 mg via ORAL
  Filled 2017-09-29 (×10): qty 1

## 2017-09-29 MED ORDER — LACTATED RINGERS IV BOLUS
500.0000 mL | Freq: Once | INTRAVENOUS | Status: AC
  Administered 2017-09-29: 500 mL via INTRAVENOUS

## 2017-09-29 MED ORDER — PRENATAL MULTIVITAMIN CH
1.0000 | ORAL_TABLET | Freq: Every day | ORAL | Status: DC
  Administered 2017-09-30 – 2017-10-03 (×4): 1 via ORAL
  Filled 2017-09-29 (×4): qty 1

## 2017-09-29 MED ORDER — SODIUM CHLORIDE 0.9% FLUSH
3.0000 mL | Freq: Two times a day (BID) | INTRAVENOUS | Status: DC
  Administered 2017-09-29 – 2017-10-03 (×8): 3 mL via INTRAVENOUS

## 2017-09-29 MED ORDER — ACETAMINOPHEN 325 MG PO TABS: 650.0000 mg | ORAL_TABLET | ORAL | Status: DC | PRN

## 2017-09-29 MED ORDER — VANCOMYCIN 50 MG/ML ORAL SOLUTION: 125.0000 mg | ORAL | Status: DC

## 2017-09-29 MED ORDER — ENOXAPARIN SODIUM 40 MG/0.4ML ~~LOC~~ SOLN: 40.0000 mg | SUBCUTANEOUS | Status: DC

## 2017-09-29 MED ORDER — NYSTATIN 100000 UNIT/ML MT SUSP
5.0000 mL | Freq: Four times a day (QID) | OROMUCOSAL | Status: DC
  Administered 2017-09-30 – 2017-10-04 (×19): 500000 [IU] via ORAL
  Filled 2017-09-29 (×26): qty 5

## 2017-09-29 NOTE — H&P (Signed)
Chief Complaint  Patient presents with  . Irregular Heart Beat  . Dizziness   HPI Mrs. Estupinan is a 42yo [phone] at [redacted]w[redacted]d with complicated PMH including migraines, anxiety, endometriosis (s/p 4 laparoscopies and D&C), benign liver hemangioma, chiari I malformation. Pregnancy has been complicated by E. Coli enteritis, C. Diff and recurrent anorexia, nausea/vomiting and diarrhea.   She was sent here today from OB/GYN office for evaluation of irregular heartbeat and dizziness. In clinic, provider auscultated PVCs and compensatory pauses with tachycardia.   Brief clinical course this pregnancy:  3/19 - E. Coli enteritis diagnosed by stool culture after nausea, vomiting, diarrhea. Treated with 3 days of azithromycin  Shortness of Breath - evaluated outpatient by cardiology   Dizziness - evaluated outpatient by neurology  5/19 - RUQ pain, N/V/D. MFM and GI consults. Recommended antacid treatment.   6-7/19 - Developed C. Diff. Treated with vancomycin. Concern for drug reaction but seen by dermatology at [facility] and unclear etiology but not drug reaction. Tongue burning consistent with median rhomboid glossitis. Treated with antifungal swish/swallow.   7/19 - Continuing vancomycin taper for persistent diarrhea.   She is here today with her husband. States symptoms have been worsening over last week. Describes throbbing in her ears, eyes, face and chest. Worried about labor and pushing if something is wrong with her heart. States not like her migraines. Feels like she is "just out of it" and "about to fall asleep" a lot of the time. Doesn't feel room spinning but rather feels like she may pass out. Diet has been limited because of diarrhea - drinking protein shakes and usually one meal a day. Staying hydrated with water. Still have multiple episodes of diarrhea a day. Taking steroids and still on vancomycin taper for C. Diff. Feels like nothing has helped symptoms. Wonders about staying in hospital  until time for labor induction.   Still on systemic steroids and antibiotics for C. Diff. Plan for colonoscopy and fecal transplant postpartum to determine etiology for ongoing diarrhea. Husband concerned that PPI caused C. Diff.       Past Medical History:  Diagnosis Date  . Anxiety    zoloft 150 mg   . Chiari malformation type I ([place])    MRI [facility] 2013  . Depression    PPD with 2nd and 3rd baby (zoloft)  . Endometriosis 1997  . Headache    no meds   . Headache in pregnancy   . Hearing loss   . Kidney infection   . Kidney stones    current pregnancy   . Otosclerosis 2008  . Otosclerosis          Past Surgical History:  Procedure Laterality Date  . DILATION AND CURETTAGE OF UTERUS  May 2015  . LAPAROSCOPIC ABDOMINAL EXPLORATION  06/2013   has had 4 surgeries  . SHOULDER SURGERY  2000  . TONSILLECTOMY AND ADENOIDECTOMY  2010  . WISDOM TOOTH EXTRACTION  2008         Family History  Problem Relation Age of Onset  . Asthma Father   . Arthritis Father        Rheumatoid  . Cancer Maternal Grandmother        skin  . Hypertension Other   . Hyperlipidemia Other   . Stroke Other   . Obesity Other   . Aneurysm Paternal Grandfather        brain  . Skin cancer Paternal Grandmother   . Migraines Neg Hx     Social  History        Tobacco Use  . Smoking status: Former Smoker    Types: Cigarettes    Last attempt to quit: 11/25/2001    Years since quitting: 15.8  . Smokeless tobacco: Never Used  Substance Use Topics  . Alcohol use: Not Currently    Alcohol/week: 1.0 standard drinks    Types: 1 Glasses of wine per week    Comment: social events  . Drug use: No    Allergies:  Allergies  Allergen Reactions  . Shellfish Allergy Hives and Swelling    Does fine with iodine  . Verapamil     GI sx, weakness  . Other     Surgical glue causes a rash  . Sulfa Antibiotics Dermatitis and Rash  . Tape  Dermatitis    With derma bond           Medications Prior to Admission  Medication Sig Dispense Refill Last Dose  . budesonide (ENTOCORT EC) 3 MG 24 hr capsule Take 3 capsules by mouth every morning.     . fluocinonide ointment (LIDEX) 0.05 % Apply topically.     . SUMAtriptan Succinate (ONZETRA XSAIL) 11 MG/NOSEPC EXHP Place into the nose. Prn     . vancomycin (VANCOCIN) 125 MG capsule Take 4 times a day for 4 weeks (starting 7/18) Then twice a day for 1 week. Then once a day for 1 week. Then every other day for 2 weeks.     . nystatin (MYCOSTATIN) 100000 UNIT/ML suspension Take 5 mLs by mouth 4 (four) times daily.  0   . omeprazole (PRILOSEC) 20 MG capsule Take 1 capsule by mouth daily.  0   . PRENATAL 27-1 MG TABS Take 1 tablet by mouth daily.     . sertraline (ZOLOFT) 100 MG tablet Take by mouth.      Review of Systems  Constitutional: Positive for activity change and appetite change. Negative for fever.  HENT: Positive for ear pain ("pulsing" and trouble hearing ). Negative for congestion.   Eyes: Positive for visual disturbance ("pulsing behind both eyes").  Respiratory: Negative for shortness of breath and wheezing.   Cardiovascular: Positive for palpitations ("heart beating out of chest"). Negative for chest pain.  Gastrointestinal: Positive for diarrhea. Negative for abdominal pain, blood in stool, nausea and vomiting.  Genitourinary: Negative for vaginal bleeding.  Musculoskeletal: Negative for arthralgias and myalgias.  Skin: Positive for rash (shoulders and upper thighs).  Neurological: Positive for dizziness and light-headedness. Negative for weakness.  Psychiatric/Behavioral: Positive for sleep disturbance.   Physical Exam   Blood pressure 119/72, pulse 93, temperature 98.2 F (36.8 C), temperature source Oral, resp. rate 20, weight 87.7 kg, last menstrual period 09/05/2016, SpO2 99 %, unknown if currently breastfeeding.  Physical Exam   Nursing note and vitals reviewed. Constitutional: She is oriented to person, place, and time. She appears well-developed and well-nourished. No distress.  Lying in bed, NAD  HENT:  Head: Normocephalic and atraumatic.  Right Ear: External ear normal.  Left Ear: External ear normal.  Mouth/Throat: Oropharynx is clear and moist.  Tongue with "satellite" white appearance  Eyes: Conjunctivae and EOM are normal. Right eye exhibits no discharge. Left eye exhibits no discharge. No scleral icterus.  Neck: Neck supple. No thyromegaly present.  Cardiovascular: Regular rhythm, normal heart sounds and intact distal pulses.  No murmur heard. tachycardic  Respiratory: Breath sounds normal. No respiratory distress. She has no wheezes. She exhibits no tenderness.  GI: Bowel sounds are normal.  Lymphadenopathy:    She has no cervical adenopathy.  Neurological: She is alert and oriented to person, place, and time. She has normal strength. No cranial nerve deficit. Coordination normal.  Skin: Skin is warm and dry. Rash (0.3-0.7cm circumscribed papules with various stages of healing, erythematous with scabbing on shoulders, upper arms and upper lateral thighs, non-tender without surrounding erythema) noted.  Psychiatric: She has a normal mood and affect. Her behavior is normal. Thought content normal.   NST Interpretation Reactive - 140s  mod  +a  -d  irregular ctx   MAU Course  Procedures  EKG Interpretation Sinus tachycardia with rate 109. Normal R-wave progression. No ST changes.   MDM  Sent from office for evaluation of irregular heart beat, dizziness. Relatively well-appearing on exam. EKG with sinus tachycardia.   Will obtain CMP, CBC, Mg and give 500cc bolus LR. Await results and plan to discuss with on-call physician. U/A with SG 1.002 suggesting well-hydrated.   Orthostatics within normal limits. Limited neuro exam within normal limits.   CBC with slightly leukocytosis and left  shift but on steroids.   Mg and CMP within normal limits   Consulted with Dr. Gaetano Net via telephone and patient to be admitted for palpitations and dizziness, poor po intake in setting of ongoing diarrhea. Plan for cardiology consult, daily NSTs, telemetry monitoring, nutrition consult and daily weights.   Assessment and Plan  Mrs. Gaby is a 42yo [phone] at [redacted]w[redacted]d with complicated PMH including migraines, anxiety, endometriosis (s/p 4 laparoscopies and D&C), benign liver hemangioma, chiari I malformation. Pregnancy has been complicated by E. Coli enteritis, C. Diff and recurrent anorexia, nausea/vomiting and diarrhea. Initial work-up unremarkable but given ongoing symptoms, Dr. Gaetano Net to admit patient for monitoring and cardiology consult. Planned induction in 5 days. Discussed with patient who is in agreement with plan.   Glenice Bow, DO  09/29/2017, 1:32 PM          Agree with above D/W patient Will continue medications I called cardiology consult for tomorrow

## 2017-09-29 NOTE — MAU Note (Signed)
Has been having pretty intense dizzy spells, when occurs feels like face is pulsating.  Started a few days ago.  Happened while at regular prenatal visit. MD said heartbeat was irregular, sent over for eval.  (Stabbing pain in RUQ, been going on entire preg, not new, left flank pain- known kidney stones irreg contractions)

## 2017-09-29 NOTE — Progress Notes (Signed)
Initial Nutrition Assessment  DOCUMENTATION CODES:   Not applicable  INTERVENTION:  Regular diet/ snacks TID per pt Ensure Enlive QID  NUTRITION DIAGNOSIS:  Altered GI function related to diarrhea as evidenced by other (comment)(inabilty to tolerate majority of solid food).  GOAL:  Patient will meet greater than or equal to 90% of their needs, Weight gain  MONITOR:  Weight trends  REASON FOR ASSESSMENT:  Consult Assessment of nutrition requirement/status  ASSESSMENT:  Now 36 2/7 weeks, induction Monday. Extensive Hx of diarrhea and c-diff this pregnancy. Has achieved adequate weight gain, 21 lbs overall. Has periods where she can consume solid foods then weeks where solid food not tol well. typically able to consume 1 meal per day. Weight gain has been maintained by consuming 4 cans of Ensure each day. Diet consists of white bland starchy foods.  Pt able to self order menu items and snacks she feels that she can tolereate    Diet Order:   Diet Order            Diet regular Room service appropriate? Yes; Fluid consistency: Thin  Diet effective now             EDUCATION NEEDS:   No education needs have been identified at this time  Skin:  Skin Assessment: Reviewed RN Assessment   Height:   Ht Readings from Last 1 Encounters:  09/29/17 5\' 9"  (1.753 m)    Weight:   Wt Readings from Last 1 Encounters:  09/29/17 87.7 kg    Ideal Body Weight:     BMI:  Body mass index is 28.54 kg/m.  Estimated Nutritional Needs:   Kcal:  2000-2200  Protein:  90-100g  Fluid:  2.3 L    Weyman Rodney M.Fredderick Severance LDN Neonatal Nutrition Support Specialist/RD III Pager [phone]      Phone [phone]

## 2017-09-29 NOTE — MAU Provider Note (Signed)
History    CSN: [id number] Arrival date and time: 09/29/17 1121   Chief Complaint  Patient presents with  . Irregular Heart Beat  . Dizziness   HPI Ellen Vasquez is a 42yo [phone] at [redacted]w[redacted]d with complicated PMH including migraines, anxiety, endometriosis (s/p 4 laparoscopies and D&C), benign liver hemangioma, chiari I malformation. Pregnancy has been complicated by E. Coli enteritis, C. Diff and recurrent anorexia, nausea/vomiting and diarrhea.   She was sent here today from OB/GYN office for evaluation of irregular heartbeat and dizziness. In clinic, provider auscultated PVCs and compensatory pauses with tachycardia.   Brief clinical course this pregnancy:  3/19 - E. Coli enteritis diagnosed by stool culture after nausea, vomiting, diarrhea. Treated with 3 days of azithromycin  Shortness of Breath - evaluated outpatient by cardiology   Dizziness - evaluated outpatient by neurology  5/19 - RUQ pain, N/V/D. MFM and GI consults. Recommended antacid treatment.   6-7/19 - Developed C. Diff. Treated with vancomycin. Concern for drug reaction but seen by dermatology at [facility] and unclear etiology but not drug reaction. Tongue burning consistent with median rhomboid glossitis. Treated with antifungal swish/swallow.   7/19 - Continuing vancomycin taper for persistent diarrhea.   She is here today with her husband. States symptoms have been worsening over last week. Describes throbbing in her ears, eyes, face and chest. Worried about labor and pushing if something is wrong with her heart. States not like her migraines. Feels like she is "just out of it" and "about to fall asleep" a lot of the time. Doesn't feel room spinning but rather feels like she may pass out. Diet has been limited because of diarrhea - drinking protein shakes and usually one meal a day. Staying hydrated with water. Still have multiple episodes of diarrhea a day. Taking steroids and still on vancomycin taper for C. Diff. Feels  like nothing has helped symptoms. Wonders about staying in hospital until time for labor induction.   Still on systemic steroids and antibiotics for C. Diff. Plan for colonoscopy and fecal transplant postpartum to determine etiology for ongoing diarrhea. Husband concerned that PPI caused C. Diff.   Past Medical History:  Diagnosis Date  . Anxiety    zoloft 150 mg   . Chiari malformation type I ([place])    MRI [facility] 2013  . Depression    PPD with 2nd and 3rd baby (zoloft)  . Endometriosis 1997  . Headache    no meds   . Headache in pregnancy   . Hearing loss   . Kidney infection   . Kidney stones    current pregnancy   . Otosclerosis 2008  . Otosclerosis     Past Surgical History:  Procedure Laterality Date  . DILATION AND CURETTAGE OF UTERUS  May 2015  . LAPAROSCOPIC ABDOMINAL EXPLORATION  06/2013   has had 4 surgeries  . SHOULDER SURGERY  2000  . TONSILLECTOMY AND ADENOIDECTOMY  2010  . WISDOM TOOTH EXTRACTION  2008    Family History  Problem Relation Age of Onset  . Asthma Father   . Arthritis Father        Rheumatoid  . Cancer Maternal Grandmother        skin  . Hypertension Other   . Hyperlipidemia Other   . Stroke Other   . Obesity Other   . Aneurysm Paternal Grandfather        brain  . Skin cancer Paternal Grandmother   . Migraines Neg Hx     Social  History   Tobacco Use  . Smoking status: Former Smoker    Types: Cigarettes    Last attempt to quit: 11/25/2001    Years since quitting: 15.8  . Smokeless tobacco: Never Used  Substance Use Topics  . Alcohol use: Not Currently    Alcohol/week: 1.0 standard drinks    Types: 1 Glasses of wine per week    Comment: social events  . Drug use: No    Allergies:  Allergies  Allergen Reactions  . Shellfish Allergy Hives and Swelling    Does fine with iodine  . Verapamil     GI sx, weakness  . Other     Surgical glue causes a rash  . Sulfa Antibiotics Dermatitis and Rash  . Tape Dermatitis    With  derma bond    Medications Prior to Admission  Medication Sig Dispense Refill Last Dose  . budesonide (ENTOCORT EC) 3 MG 24 hr capsule Take 3 capsules by mouth every morning.     . fluocinonide ointment (LIDEX) 0.05 % Apply topically.     . SUMAtriptan Succinate (ONZETRA XSAIL) 11 MG/NOSEPC EXHP Place into the nose. Prn     . vancomycin (VANCOCIN) 125 MG capsule Take 4 times a day for 4 weeks (starting 7/18) Then twice a day for 1 week. Then once a day for 1 week. Then every other day for 2 weeks.     . nystatin (MYCOSTATIN) 100000 UNIT/ML suspension Take 5 mLs by mouth 4 (four) times daily.  0   . omeprazole (PRILOSEC) 20 MG capsule Take 1 capsule by mouth daily.  0   . PRENATAL 27-1 MG TABS Take 1 tablet by mouth daily.     . sertraline (ZOLOFT) 100 MG tablet Take by mouth.      Review of Systems  Constitutional: Positive for activity change and appetite change. Negative for fever.  HENT: Positive for ear pain ("pulsing" and trouble hearing ). Negative for congestion.   Eyes: Positive for visual disturbance ("pulsing behind both eyes").  Respiratory: Negative for shortness of breath and wheezing.   Cardiovascular: Positive for palpitations ("heart beating out of chest"). Negative for chest pain.  Gastrointestinal: Positive for diarrhea. Negative for abdominal pain, blood in stool, nausea and vomiting.  Genitourinary: Negative for vaginal bleeding.  Musculoskeletal: Negative for arthralgias and myalgias.  Skin: Positive for rash (shoulders and upper thighs).  Neurological: Positive for dizziness and light-headedness. Negative for weakness.  Psychiatric/Behavioral: Positive for sleep disturbance.   Physical Exam   Blood pressure 119/72, pulse 93, temperature 98.2 F (36.8 C), temperature source Oral, resp. rate 20, weight 87.7 kg, last menstrual period 09/05/2016, SpO2 99 %, unknown if currently breastfeeding.  Physical Exam  Nursing note and vitals reviewed. Constitutional: She is  oriented to person, place, and time. She appears well-developed and well-nourished. No distress.  Lying in bed, NAD  HENT:  Head: Normocephalic and atraumatic.  Right Ear: External ear normal.  Left Ear: External ear normal.  Mouth/Throat: Oropharynx is clear and moist.  Tongue with "satellite" white appearance  Eyes: Conjunctivae and EOM are normal. Right eye exhibits no discharge. Left eye exhibits no discharge. No scleral icterus.  Neck: Neck supple. No thyromegaly present.  Cardiovascular: Regular rhythm, normal heart sounds and intact distal pulses.  No murmur heard. tachycardic  Respiratory: Breath sounds normal. No respiratory distress. She has no wheezes. She exhibits no tenderness.  GI: Bowel sounds are normal.  Lymphadenopathy:    She has no cervical adenopathy.  Neurological:  She is alert and oriented to person, place, and time. She has normal strength. No cranial nerve deficit. Coordination normal.  Skin: Skin is warm and dry. Rash (0.3-0.7cm circumscribed papules with various stages of healing, erythematous with scabbing on shoulders, upper arms and upper lateral thighs, non-tender without surrounding erythema) noted.  Psychiatric: She has a normal mood and affect. Her behavior is normal. Thought content normal.   NST Interpretation Reactive - 140s  mod  +a  -d  irregular ctx   MAU Course  Procedures  EKG Interpretation Sinus tachycardia with rate 109. Normal R-wave progression. No ST changes.   MDM  Sent from office for evaluation of irregular heart beat, dizziness. Relatively well-appearing on exam. EKG with sinus tachycardia.   Will obtain CMP, CBC, Mg and give 500cc bolus LR. Await results and plan to discuss with on-call physician. U/A with SG 1.002 suggesting well-hydrated.   Orthostatics within normal limits. Limited neuro exam within normal limits.   CBC with slightly leukocytosis and left shift but on steroids.   Mg and CMP within normal limits    Consulted with Dr. Gaetano Net via telephone and patient to be admitted for palpitations and dizziness, poor po intake in setting of ongoing diarrhea. Plan for cardiology consult, daily NSTs, telemetry monitoring, nutrition consult and daily weights.   Assessment and Plan  Ellen Vasquez is a 42yo [phone] at [redacted]w[redacted]d with complicated PMH including migraines, anxiety, endometriosis (s/p 4 laparoscopies and D&C), benign liver hemangioma, chiari I malformation. Pregnancy has been complicated by E. Coli enteritis, C. Diff and recurrent anorexia, nausea/vomiting and diarrhea. Initial work-up unremarkable but given ongoing symptoms, Dr. Gaetano Net to admit patient for monitoring and cardiology consult. Planned induction in 5 days. Discussed with patient who is in agreement with plan.   Glenice Bow, DO  09/29/2017, 1:32 PM

## 2017-09-29 NOTE — Progress Notes (Signed)
Pt called RN into room to report "having that feeling again".  Pt reported seeing stars, feeling like she could faint, trembling hands, and "feeling just not right".  Pt calm while explaining symptoms to RN.  Pt says this like other episodes of last 48 hours that led her to go to office today.   HR 116-120, BP 130/78, RR 17, O2SAT 99%RA.  On ausculation, HR noted HR to be irregular with missed beats at random intervals.  Pt's face flushed, but not diaphoretic.  Phoned Dr. Gaetano Net with above information.  No new orders received.

## 2017-09-30 ENCOUNTER — Inpatient Hospital Stay (HOSPITAL_COMMUNITY): Payer: BLUE CROSS/BLUE SHIELD

## 2017-09-30 DIAGNOSIS — I493 Ventricular premature depolarization: Secondary | ICD-10-CM

## 2017-09-30 DIAGNOSIS — R42 Dizziness and giddiness: Secondary | ICD-10-CM

## 2017-09-30 DIAGNOSIS — O9989 Other specified diseases and conditions complicating pregnancy, childbirth and the puerperium: Secondary | ICD-10-CM

## 2017-09-30 DIAGNOSIS — R002 Palpitations: Secondary | ICD-10-CM

## 2017-09-30 DIAGNOSIS — R197 Diarrhea, unspecified: Secondary | ICD-10-CM

## 2017-09-30 DIAGNOSIS — Z3A36 36 weeks gestation of pregnancy: Secondary | ICD-10-CM

## 2017-09-30 NOTE — Progress Notes (Signed)
Call to Dr. Matthew Saras per pt. Request d/t feeling of "bobble headedness" Pt. Vitals WNL no runs of PVCs or PACs at the time. RR alarm went off due to pt. Movement. This was explained to pt. And her husband, to their dissatisfaction.  Dr. Matthew Saras offered PRN anxiety meds if needed. This was explained to pt. And she refused. Will monitor.

## 2017-09-30 NOTE — Consult Note (Signed)
Cardiology Consultation:   Patient ID: Ellen Vasquez; [id number]; 1975-07-28   Admit date: 09/29/2017 Date of Consult: 09/30/2017  Primary Care Provider: Aretta Nip, MD Primary Cardiologist: Quay Burow, MD   Patient Profile:   Ellen Vasquez is a 42 y.o. female with a hx of migraines, endometriosis, anxiety, benign liver hemangioma and Chiari I malformation, E. coli enteritis, C. difficile and recurrent anorexia, nausea/vomiting and diarrhea who is being seen today for the evaluation of palpitations and dizziness at the request of Dr. Gaetano Net.  History of Present Illness:   Ellen Vasquez is a 42 year old female who is [redacted] weeks pregnant, her 4th.  She has 3 daughters at home. She has a history of migraines, endometriosis, anxiety, benign liver hemangioma and Chiari I malformation, E. coli enteritis, C. difficile and recurrent anorexia, nausea/vomiting and diarrhea.  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  Her pregnancy has been complicated by E. coli enteritis, C. difficile and recurrent anorexia/nausea vomiting and diarrhea.  She has been very fatigued and has shortness of breath even walking to her mailbox with this pregnancy.  She was seen in February by Dr. Gwenlyn Found for evaluation of dyspnea on exertion.  She had been evaluated for DVT that was negative and chest x-ray showed no active disease.  An echocardiogram was entirely normal.  There was a plan to see her back in the office after her pregnancy for reevaluation.  For about the last week Ellen Vasquez has been experiencing a lightheadedness that she describes as a pulsation in her face like the vibration of a speaker followed by a feeling that she is about to pass out and she has to struggle to hold on.  She also has associated upper chest tightness with shortness of breath.  These episodes occur without relation to any specific  activity or body position.  They can occur when she is lying on the couch, up walking or sitting down.  Over about the last 2 days her symptoms have become more frequent, occurring many times a day.  She has not actually had any full syncope.  She is still being treated for C. difficile with vancomycin.  She continues to have about 15 loose stools per day.  She concentrates on trying to maintain her fluid intake.  She has a family history of cardiac disease on her father's side.  Her paternal grandfather died suddenly of a brain aneurysm and her maternal grandfather died of an MI at age 14.  There is no early sudden death of no known origin.  She does not smoke or drink alcohol.  She denies any caffeine use except for an occasional piece of chocolate.  She has been evaluated by [facility] for Chiari I malformation and no neurosurgical intervention was needed. She has had migraines since her teens and if followed by [facility].  She was told by [facility] neurosurgery that her headaches were not related to the Chiari 1 malformation.  Past Medical History:  Diagnosis Date  . Anxiety    zoloft 150 mg   . Chiari malformation type I ([place])    MRI [facility] 2013  . Depression    PPD with 2nd and 3rd baby (zoloft)  . Endometriosis 1997  . Headache    no meds   . Headache in pregnancy   . Hearing loss   . Kidney infection   .  Kidney stones    current pregnancy   . Otosclerosis 2008  . Otosclerosis     Past Surgical History:  Procedure Laterality Date  . DILATION AND CURETTAGE OF UTERUS  May 2015  . LAPAROSCOPIC ABDOMINAL EXPLORATION  06/2013   has had 4 surgeries  . SHOULDER SURGERY  2000  . TONSILLECTOMY AND ADENOIDECTOMY  2010  . WISDOM TOOTH EXTRACTION  2008     Home Medications:  Prior to Admission medications   Medication Sig Start Date End Date Taking? Authorizing Provider  budesonide (ENTOCORT EC) 3 MG 24 hr capsule Take 3 capsules by mouth every  morning. 09/21/17  Yes [provider]  nystatin (MYCOSTATIN) 100000 UNIT/ML suspension Take 5 mLs by mouth 4 (four) times daily. 09/24/17  Yes [provider]  ondansetron (ZOFRAN-ODT) 4 MG disintegrating tablet Take 4 mg by mouth every 8 (eight) hours as needed for nausea or vomiting.   Yes [provider]  PRENATAL 27-1 MG TABS Take 1 tablet by mouth daily.   Yes [provider]  promethazine (PHENERGAN) 25 MG tablet Take 25 mg by mouth every 6 (six) hours as needed for nausea or vomiting.   Yes [provider]  ranitidine (ZANTAC) 150 MG tablet Take 150 mg by mouth 2 (two) times daily.   Yes [provider]  sertraline (ZOLOFT) 100 MG tablet Take by mouth.   Yes [provider]  vancomycin (VANCOCIN) 125 MG capsule Take 4 times a day for 4 weeks (starting 7/18) Then twice a day for 1 week. Then once a day for 1 week. Then every other day for 2 weeks. 09/14/17  Yes [provider]  zolpidem (AMBIEN) 5 MG tablet Take 5 mg by mouth at bedtime as needed for sleep.   Yes [provider]    Inpatient Medications: Scheduled Meds: . docusate sodium  100 mg Oral Daily  . famotidine  20 mg Oral BID  . feeding supplement (ENSURE ENLIVE)  1 Bottle Oral QID  . nystatin  5 mL Oral QID  . prenatal multivitamin  1 tablet Oral Q1200  . sertraline  100 mg Oral Daily  . sodium chloride flush  3 mL Intravenous Q12H  . vancomycin  125 mg Oral Q6H   Followed by  . [START ON 10/01/2017] vancomycin  125 mg Oral BID   Followed by  . [START ON 10/08/2017] vancomycin  125 mg Oral Daily   Followed by  . [START ON 10/16/2017] vancomycin  125 mg Oral QODAY   Continuous Infusions:  PRN Meds: acetaminophen, calcium carbonate, ondansetron, promethazine, zolpidem  Allergies:    Allergies  Allergen Reactions  . Shellfish Allergy Hives and Swelling    Does fine with iodine  . Verapamil     GI sx, weakness  . Other     Surgical glue  causes a rash  . Sulfa Antibiotics Dermatitis and Rash  . Tape Dermatitis    With derma bond    Social History:   Social History   Socioeconomic History  . Marital status: Married    Spouse name: Not on file  . Number of children: 2  . Years of education: Phd ED  . Highest education level: Not on file  Occupational History  . Occupation: Professor    Comment: [employer]  . Financial resource strain: Not on file  . Food insecurity:    Worry: Not on file    Inability: Not on file  . Transportation needs:  Medical: Not on file    Non-medical: Not on file  Tobacco Use  . Smoking status: Former Smoker    Types: Cigarettes    Last attempt to quit: 11/25/2001    Years since quitting: 15.8  . Smokeless tobacco: Never Used  Substance and Sexual Activity  . Alcohol use: Not Currently    Alcohol/week: 1.0 standard drinks    Types: 1 Glasses of wine per week    Comment: social events  . Drug use: No  . Sexual activity: Not Currently    Birth control/protection: None  Lifestyle  . Physical activity:    Days per week: Not on file    Minutes per session: Not on file  . Stress: Not on file  Relationships  . Social connections:    Talks on phone: Not on file    Gets together: Not on file    Attends religious service: Not on file    Active member of club or organization: Not on file    Attends meetings of clubs or organizations: Not on file    Relationship status: Not on file  . Intimate partner violence:    Fear of current or ex partner: Not on file    Emotionally abused: Not on file    Physically abused: Not on file    Forced sexual activity: Not on file  Other Topics Concern  . Not on file  Social History Narrative   Caffeine none (occasional soda).    Family History:    Family History  Problem Relation Age of Onset  . Asthma Father   . Arthritis Father        Rheumatoid  . Cancer Maternal Grandmother        skin  . Hypertension Other   .  Hyperlipidemia Other   . Stroke Other   . Obesity Other   . Aneurysm Paternal Grandfather        brain  . Skin cancer Paternal Grandmother   . Migraines Neg Hx      ROS:  Please see the history of present illness.   All other ROS reviewed and negative.     Physical Exam/Data:   Vitals:   09/29/17 1953 09/29/17 2343 09/30/17 0420 09/30/17 0558  BP: 133/80 124/67 106/66   Pulse: 75 89 79   Resp: 18 16 16    Temp: 98.7 F (37.1 C) 98.5 F (36.9 C) 98.2 F (36.8 C)   TempSrc: Oral Oral Oral   SpO2: 97% 98% 100%   Weight:    86.2 kg  Height:        Intake/Output Summary (Last 24 hours) at 09/30/2017 0815 Last data filed at 09/30/2017 0421 Gross per 24 hour  Intake 1951.38 ml  Output 2000 ml  Net -48.62 ml   Filed Weights   09/29/17 1144 09/29/17 1500 09/30/17 0558  Weight: 87.7 kg 87.7 kg 86.2 kg   Body mass index is 28.07 kg/m.  General:  Well nourished, well developed, in no acute distress HEENT: normal Lymph: no adenopathy Neck: no JVD Endocrine:  No thryomegaly Vascular: No carotid bruits; FA pulses 2+ bilaterally without bruits  Cardiac:  normal S1, S2; RRR with occ early beat; no murmur  Lungs:  clear to auscultation bilaterally, no wheezing, rhonchi or rales  Abd: soft, nontender, no hepatomegaly  Ext: no edema Musculoskeletal:  No deformities, BUE and BLE strength normal and equal Skin: warm and dry  Neuro:  CNs 2-12 intact, no focal abnormalities noted Psych:  Normal  affect   EKG:  The EKG was personally reviewed and demonstrates: Sinus tachycardia at 109 bpm with no abnormalities Telemetry:  Telemetry was personally reviewed and demonstrates: Sinus rhythm in the 80s-90s with occasional PVCs, occasional heart rates up to the 110s-120  Relevant CV Studies:  Echocardiogram 03/30/2017 Study Conclusions - Left ventricle: Global LV longitudinal strain is normal at -18.1%   The cavity size was normal. Systolic function was normal. The   estimated ejection  fraction was in the range of 55% to 60%. Wall   motion was normal; there were no regional wall motion   abnormalities. - Aortic valve: Trileaflet; mildly thickened, mildly calcified   leaflets. - Mitral valve: There was trivial regurgitation. - Pulmonary arteries: Systolic pressure could not be accurately   estimated. - Impressions: Normal LV function, LV strain and diastolic   function. Trivial MR.  Impressions: - Normal LV function, LV strain and diastolic function. Trivial MR.  Laboratory Data:  Chemistry Recent Labs  Lab 09/29/17 1332  NA 135  K 4.1  CL 104  CO2 20*  GLUCOSE 75  BUN 6  CREATININE 0.63  CALCIUM 9.3  GFRNONAA >60  GFRAA >60  ANIONGAP 11    Recent Labs  Lab 09/29/17 1332  PROT 6.2*  ALBUMIN 3.3*  AST 19  ALT 12  ALKPHOS 136*  BILITOT 0.7   Hematology Recent Labs  Lab 09/29/17 1332  WBC 12.4*  RBC 4.21  HGB 12.5  HCT 37.4  MCV 88.8  MCH 29.7  MCHC 33.4  RDW 13.1  PLT 226   Cardiac EnzymesNo results for input(s): TROPONINI in the last 168 hours. No results for input(s): TROPIPOC in the last 168 hours.  BNPNo results for input(s): BNP, PROBNP in the last 168 hours.  DDimer No results for input(s): DDIMER in the last 168 hours.  Radiology/Studies:  No results found.  Assessment and Plan:   Lightheadedness and palpitations -39-week pregnant female who has been plagued by fatigue and dyspnea on exertion since early in her pregnancy which has been complicated by E. Coli enteritis and C. difficile with nausea vomiting and diarrhea. -She had lightheadedness early in her pregnancy which improved and now for the last week she has been having progressively more frequent lightheaded episodes with near syncope. -She was evaluated in February by Dr. Gwenlyn Found and found to have a normal echocardiogram and no cardiac findings to account for her symptoms. -She is on steroids for treatment of C. Difficile.  Budesonide is known to cause weakness,  tiredness, lightheadedness and near syncope.  I am not sure why this would be causing her only recent symptoms unless the progression of the pregnancy has altered her body's response. -Telemetry has demonstrated sinus rhythm with PVCs and occasional sinus tach up to the 110s-120.  I do not see any tachyarrhythmias or rhythms to account for her symptoms despite her having had the symptoms many times while on telemetry. -Electrolytes are normal with K+ 4.1 and magnesium 2.1, despite her frequent loose bowel movements -TSH was normal in May of this year.  -She is not anemic -I am not finding any cardiac etiology for her symptoms at this time.  Dr. Oval Linsey is to see the patient later today for further evaluation.   For questions or updates, please contact [place] Please consult [email or web address] for contact info under Cardiology/STEMI.   Signed, Daune Perch, NP  09/30/2017 8:15 AM

## 2017-09-30 NOTE — Progress Notes (Signed)
Patient informed RN at the said time that she had had another episode of the "faint feeling". Assessments remained unchanged. Patient was reassured that everything looked fine and that is why she is over here to be monitored.   Will keep monitoring.

## 2017-09-30 NOTE — Consult Note (Addendum)
Maternal-Fetal Medicine Requesting Provider: Everlene Farrier, MD  I had the pleasure of seeing [name] today at the [facility]. A consultation was requested to address specifically the timing of delivery. Patient is now admitted with c/o dizziness and a feeling of "face coming off." She had PVCs (telemetry) and was evaluated by the cardiology team. No specific tests including echocardiography was recommended and resolution of symptoms were expected after delivery.  PMH: Briefly, she had E.coli infection and diarrhea in March 2019 and later she had tested positive for C.difficile (PCR) infection in June 2019. She was treated with vancomycin and for a short duration with fidoximicin (could not tolerate). Now, she is on vancomycin taper dose. Patient had endoscopy in June 2019 at [facility] (Dr. Grafton Folk). Biopsy was unremarkable and negative for H. pylori. Dr. Loraine Maple had discussed with her on the possibility of having inflammatory bowel disease and offered to perform colonoscopy. Fecal microbiota transplantation or FMT (for refractory C.diff infection) was also discussed. Budesonide treatment was also offered. Patient had opted not to have colonoscopy or FMT. I had discussed her case with Dr. Loraine Maple last week (phone).  She does not have hypertension or diabetes or any other chronic medical conditions. She has Arnold-Chiari Malformation (Type 1) and has occasional migraines.  Obstetric history: 10/2004: Term vaginal delivery of a female infant weighing 7-11 at birth. 08/2007: Term vaginal delivery of a female infant weighing 8-5 at birth. Patient reports she had 4th degree perineal tear at birth that was repaired. She did not have fecal or urinary incontinence. 08/2014: Term vaginal delivery of a female infant weighing 8-1 at birth. Delivery was complicated by shoulder dystocia and the baby had no neurological symptoms at birth. All her children are in good health. Gyn  history: No history of cervical surgeries.   Allergies: Sulfa drugs (rashes), verapamil (dizziness or nausea), surgical glue (dermatitis), shell fish (swelling and hives). Social: Denies tobacco or drug or alcohol use. She has been married 15 years and her husband is in good health. Family history: No history of venous thromboembolism or inflammatory bowel disease.   I counseled the patient on the timing of delivery. Timing of delivery: -Delivery at 37 weeks confers advantages to the mother in choosing appropriate options for treating diarrhea. This includes colonoscopy under anesthesia or other invasive procedures and fecal microbiota transplantation. Patient is reluctant to undergo colonoscopy and does not want fecal transplantation in pregnancy.  -I informed her that colonoscopy can be performed in pregnancy and anesthesia is not a contraindication. Fetal effects of anesthesia are minimal. Patient informed she had a very rough experience after endoscopy in this pregnancy. She had voided after the procedure and had anxious moments before finding out it was not PPROM. She does not want to undergo any procedure while pregnant.  -Experience of FMT in pregnancy is limited. I could locate one case report of FMT with no adverse outcome reported. -I informed the patient that delivery alone is unlikely to improve her symptoms of diarrhea. -She had acute weight loss and has regained her weight to some extent now. We discussed TPN and the patient informed that she had been counseled before and does not want to take that small chance of line infection during pregnancy.  Neonatal issues: I informed her that babies born at 26 weeks (as opposed to 39 weeks) have a slightly increased risk of respiratory-distress syndrome and readmissions.  Patient still has diarrhea (about 12 times daily) and she does not have  any other acute symptoms (abdominal pain or persistent vomiting). She feels that she could have acute  exacerbation of GI symptoms before delivery that may place the fetus at risk, which is a genuine concern for the patient.  There is significant maternal discomfort now and the patient strongly desires delivery at 37 weeks. Although delivery is not going to have a direct impact on her GI problems, it can expedite her recovery by earlier interventional procedures and treatment.  Compared with her previous children, the birth weight of this baby is likely to be lower and we cannot determine whether her diarrheal illness or weight loss could have had effect on the fetal growth.  It is reasonable to consider delivery at 37 weeks' gestation and the patient is aware of a small increase in neonatal complications.  I discussed her history of shoulder dystocia without neurological injuries. The likelihood of recurrent shoulder dystocia is about 15%. Patient had fetal growth assessment at your office 9 days ago and no macrosomia was detected. She understands the risk of recurrent shoulder dystocia and prefers to have vaginal delivery.  I informed the patient that our goal will be to induce labor at 37 weeks. However, induction should not be carried out if the patient has acute exacerbations including severe abdominal pain from GI issues.  Recommendations: -Delivery at 37 weeks. -BPP today or tomorrow. -I had emailed Dr. Loraine Maple (at her request) about the possibility of induction at 37 weeks. She would like plan for further appointment and procedures. -Anticipate shoulder dystocia during delivery (less likely).  Thank you for your consult. If you have any questions or concerns, please do not hesitate to contact me at the [facility].  Consultation including face-to-face counseling: 45 minutes.

## 2017-09-30 NOTE — Progress Notes (Signed)
Telemetry monitoring in progress. Patient in NSR. Pt stating she just does not "feel well" all over. Denies the need for pain medication at this time. Toya Smothers, RN

## 2017-09-30 NOTE — Progress Notes (Signed)
Patient ID: Ellen Vasquez, female   DOB: [birth year], 42 y.o.   MRN: [id number]   [redacted]w[redacted]d  S/  Still c/o dizziness O//BP 107/70 (BP Location: Left Arm)   Pulse 88   Temp 98.2 F (36.8 C) (Oral)   Resp 12   Ht 5\' 9"  (1.753 m)   Wt 86.2 kg   LMP 09/05/2016 (Exact Date)   SpO2 94%   BMI 28.07 kg/m   FHR stable  card consult noted, MD to see this aft  A+P//[redacted]w[redacted]d On vanc for C diff, now rehydrated with NSR on EKG Cards to see this aft Will check w/ MFM again to chk re induction timing

## 2017-09-30 NOTE — Progress Notes (Signed)
RN heard monitors alarming in room, RN went in to find patient c/o not feeling well and lying on her side.  She stated she felt like someone sitting on her chest.  Telemetry showed more PVC's and PAC's.  E-link was contacted and also MD was paged.

## 2017-09-30 NOTE — Progress Notes (Signed)
Per the recommendations of E-Link Patient was put on 2 liters O2.

## 2017-09-30 NOTE — Progress Notes (Signed)
E link notified to look at pt's telemetry. E link recommended to put 2L of O2 on pt due to low O2 levels. MD notified.

## 2017-10-01 ENCOUNTER — Inpatient Hospital Stay (HOSPITAL_BASED_OUTPATIENT_CLINIC_OR_DEPARTMENT_OTHER): Payer: BLUE CROSS/BLUE SHIELD

## 2017-10-01 DIAGNOSIS — Z3A35 35 weeks gestation of pregnancy: Secondary | ICD-10-CM

## 2017-10-01 DIAGNOSIS — R002 Palpitations: Secondary | ICD-10-CM

## 2017-10-01 DIAGNOSIS — O2693 Pregnancy related conditions, unspecified, third trimester: Secondary | ICD-10-CM

## 2017-10-01 LAB — TYPE AND SCREEN
ABO/RH(D): B POS
Antibody Screen: NEGATIVE

## 2017-10-01 NOTE — Progress Notes (Signed)
To bedside ~45 mins ago. Pt. With tachycardia into the 140s-150s. Full set of vitals obtained and pt. Otherwise WNL. Tachycardia resolved in a very short time frame. Pt. Reported waking up from sleep and possibly having contractions. I put her on the monitor. 3 UCs were seen. Pt. Reported the contractions as mild in nature, able to breath through them. No new vaginal symptoms. Pt. Remained in NSR with occasional PVCs, of which she reported a flutter/head bobbling feeling. I stayed in room while pt. Ambulated to the bathroom, no s/s of dizzyness or unsteadiness on her legs.  Will continue to monitor.

## 2017-10-01 NOTE — Progress Notes (Signed)
Rn spoke with Dr. Debara Pickett, Cardiology per Dr. Elaina Pattee request.  Patient not to need tele during labor and delivery per Dr. Debara Pickett.

## 2017-10-01 NOTE — Progress Notes (Signed)
Rn to bedside for alarming monitors, pt reporting dizziness and lightheadedness. Vital signs WNL at this time.  Dr. Royston Sinner notified.

## 2017-10-01 NOTE — Progress Notes (Signed)
S: Diarrhea unchanged.  No current c/o of SOB, CP or dizziness.  Denies syncope or pre-syncope.  Denies ctxn, LOF or VB. +FM.  PMH: migraines, anxiety, endometriosis (s/p 4 laparoscopies and D&C), benign liver hemangioma, chiari I malformation.  Pregnancy has been complicated b: E. Coli enteritis SOB - evaluated by cards as outpt  Dizziness - evaluated by neuro as outpt C. Diff and recurrent anorexia, nausea/vomiting and diarrhea - evaluated by GI, being treated w/PO vanc and plan for fecal transplant pp  O: AFVSS Gen: well appearing NAD Pulm: CTAB CV: Reg HR on ascultation Abd: soft, nontender, gravid SVE: deferred GYN: No blood on pad  A/P: 42 yo G4P3003 @ 36.4wga w/h/o multiple admissions this pregnancy/complicated PMH admitted for cardiac consultation s/s new onset palpitations in the setting of chronic dehydration caused by refractory C. Diff infection. She has now been hydrated and is s/p cards eval. She will remain in house for optimization of delivery.    # C Diff:  - Cont PO vanc - Cont IVF - dehydration resolved and electrolytes WNL  # Dizziness/PVCs - ECHO early in pregnancy WNL, s/p cards w/u previously - TFTS wnl - New cards ass't and recs:  - PVCs causing dizziness --> benign, nothing further to do unless pt wants symptoms treated and she declines beta blockers  - Telemetry may be turned off  - No telemetry needed for labor  - Recommend wu postpartum if s/s continue  # Pregnancy: +FM, no ctxn, LOF or bleeding.  - BPP today - Delivery at 37.0 wga is recommended by MFM given above complicating factors   Arty Baumgartner MD

## 2017-10-01 NOTE — Progress Notes (Signed)
Pt removed from cardiac monitoring per Dr Royston Sinner

## 2017-10-02 LAB — URINALYSIS, ROUTINE W REFLEX MICROSCOPIC
Bilirubin Urine: NEGATIVE
Glucose, UA: NEGATIVE mg/dL
Hgb urine dipstick: NEGATIVE
Ketones, ur: NEGATIVE mg/dL
LEUKOCYTES UA: NEGATIVE
NITRITE: NEGATIVE
PH: 8 (ref 5.0–8.0)
Protein, ur: NEGATIVE mg/dL
SPECIFIC GRAVITY, URINE: 1.005 (ref 1.005–1.030)

## 2017-10-02 NOTE — Progress Notes (Signed)
S: Diarrhea unchanged. SOB/dizziness improved from last few days - no new symptoms.    PMH: migraines, anxiety, endometriosis (s/p 4 laparoscopies and D&C), benign liver hemangioma, chiari I malformation.  Pregnancy has been complicated b: E. Coli enteritis SOB - evaluated by cards as outpt  Dizziness - evaluated by neuro as outpt C. Diff and recurrent anorexia, nausea/vomiting and diarrhea - evaluated by GI, being treated w/PO vanc and plan for fecal transplant pp  O: AFVSS Gen: well appearing NAD Pulm: CTAB CV: Reg HR on ascultation Abd: soft, nontender, gravid SVE: deferred GYN: No blood on pad  A/P: 42 yo G4P3003 @ 36.5wga w/h/o multiple admissions this pregnancy/complicated PMH admitted for cardiac consultation s/s new onset palpitations in the setting of chronic dehydration caused by refractory C. Diff infection. She has now been hydrated and is s/p cards eval. She will remain in house for optimization of delivery.    # C Diff:  - Cont PO vanc - Cont IVF - dehydration resolved and electrolytes WNL  # Dizziness/PVCs - ECHO early in pregnancy WNL, s/p cards w/u previously - TFTS wnl - Newest inpatient cards ass't and recs:             - PVCs causing dizziness --> benign, nothing further to do unless pt wants symptoms treated and she declines beta blockers             - Telemetry may be turned off             - No telemetry needed for labor             - Recommend wu postpartum if s/s continue  # Pregnancy: +FM, no ctxn, LOF or bleeding.  - Delivery at 37.0 wga is recommended by MFM given above complicating factors

## 2017-10-03 NOTE — Progress Notes (Signed)
Transferred to L&D per wheelchair for induction as ordered.  Vital signs taken and recorded.

## 2017-10-03 NOTE — Progress Notes (Signed)
Ellen Vasquez, infection preventist called again after speaking with Ellen Vasquez, infection preventist here at [facility], with further recommendations. After delivery, if infant has to leave mother's room for any reason, prior to taking baby out, bassinette should be wiped down with Sani-Cloths bleach wipes. Ms. Alford Highland also recommended that if infant has to be admitted to NICU, that infant be placed in a room as far away from the main entrance and exit point in the unit, and preferably in a room with no other infants, if possible.

## 2017-10-03 NOTE — Progress Notes (Signed)
Spoke with Dr. Royston Sinner via phone regarding labor and delivery nursing staff's request for repeat C.diff culture. Dr. Royston Sinner stated that her understanding is that the standard of care is 3 negative cultures be obtained prior to d/c'ing enteric precautions. Patient stated that she is having 12 stools a day still. Dr. Royston Sinner feels it prudent to keep patient on enteric precautions while hospitalized. Paged infection control nurse to confirm

## 2017-10-03 NOTE — Progress Notes (Signed)
S: No complaints except mild stuffy nose. Excited about delivery.     Ellen Vasquez:[id number], anxiety, endometriosis (s/p 4 laparoscopies and D&C), benign liver hemangioma, chiari I malformation.  Pregnancy has been complicated b: E. Coli enteritis SOB - evaluated by cards as outpt  Dizziness - evaluated by neuro as outpt C. Diff and recurrent anorexia, nausea/vomiting and diarrhea- evaluated by GI, being treated w/PO vanc and plan for fecal transplant pp  O: AFVSS Gen: well appearing NAD Pulm: CTAB CV: Reg HR on ascultation Abd: soft, nontender, gravid SVE: deferred GYN: No blood on pad  A/P:41 yo [id number] @ 36.6wga w/h/o multiple admissions this pregnancy/complicated PMH admitted for cardiac consultation s/s new onset palpitations in the setting of chronic dehydration caused by refractory C. Diff infection. She has now been hydrated and is s/p cards eval. She will remain in house for optimization of delivery.   # C Diff: - Cont PO vanc - Cont IVF - dehydration resolved and electrolytes WNL  # Dizziness/PVCs - ECHO early in pregnancy WNL, s/p cards w/u previously - TFTS wnl - Newest inpatient cardsass't andrecs: - PVCs causing dizziness --> benign, nothing further to do unless pt wants symptoms treated and she declines beta blockers - Telemetry may be turned off - No telemetry needed for labor - Recommend wu postpartum if s/s continue  # Pregnancy: +FM, no ctxn, LOF or bleeding.  - Delivery at 37.0 wga is recommended by MFM given above complicating factors - Plan tonight at midnight transfer to L&D for cytotec --> AROM/pitocin when able

## 2017-10-03 NOTE — Progress Notes (Signed)
Spoke with Kurtis Bushman, infection prevention clinician on call regarding plan for induction tomorrow and recommendations for prevention of transmission of C.difficile to infant. Ms. Alford Highland recommended that infant be bathed as soon as feasible once temperature is stable. She emphasized the importance of staff using [place] with soap and water after caring for mother and infant, and that Mrs. Fuerst use GHWT with soap and water after toileting. She recommended that patient's chest and abdominal area be washed with soap and water prior to skin-to-skin contact. Ms. Alford Highland emphasized also that patient should have a clean gown after delivery and prior to holding or touching infant.

## 2017-10-04 ENCOUNTER — Inpatient Hospital Stay (HOSPITAL_COMMUNITY)
Admission: RE | Admit: 2017-10-04 | Discharge: 2017-10-04 | Disposition: A | Discharge status: Home / Self Care | Payer: BLUE CROSS/BLUE SHIELD | Source: Ambulatory Visit | Attending: Obstetrics and Gynecology | Admitting: Obstetrics and Gynecology

## 2017-10-04 ENCOUNTER — Inpatient Hospital Stay (HOSPITAL_COMMUNITY): Payer: BLUE CROSS/BLUE SHIELD | Admitting: Anesthesiology

## 2017-10-04 ENCOUNTER — Encounter (HOSPITAL_COMMUNITY): Payer: Self-pay

## 2017-10-04 LAB — RPR: RPR Ser Ql: NONREACTIVE

## 2017-10-04 LAB — COMPREHENSIVE METABOLIC PANEL
ALT: 9 U/L (ref 0–44)
AST: 14 U/L — AB (ref 15–41)
Albumin: 2.6 g/dL — ABNORMAL LOW (ref 3.5–5.0)
Alkaline Phosphatase: 108 U/L (ref 38–126)
Anion gap: 10 (ref 5–15)
BUN: 9 mg/dL (ref 6–20)
CHLORIDE: 104 mmol/L (ref 98–111)
CO2: 20 mmol/L — AB (ref 22–32)
Calcium: 8.3 mg/dL — ABNORMAL LOW (ref 8.9–10.3)
Creatinine, Ser: 0.65 mg/dL (ref 0.44–1.00)
GFR calc Af Amer: >60 mL/min (ref >60)
GFR calc non Af Amer: >60 mL/min (ref >60)
Glucose, Bld: 83 mg/dL (ref 70–99)
Potassium: 3.6 mmol/L (ref 3.5–5.1)
SODIUM: 134 mmol/L — AB (ref 135–145)
Total Bilirubin: 0.6 mg/dL (ref 0.3–1.2)
Total Protein: 5.4 g/dL — ABNORMAL LOW (ref 6.5–8.1)

## 2017-10-04 LAB — CBC
HCT: 29.1 % — ABNORMAL LOW (ref 36.0–46.0)
HEMOGLOBIN: 9.5 g/dL — AB (ref 12.0–15.0)
MCH: 29.4 pg (ref 26.0–34.0)
MCHC: 32.6 g/dL (ref 30.0–36.0)
MCV: 90.1 fL (ref 78.0–100.0)
Platelets: 167 10*3/uL (ref 150–400)
RBC: 3.23 MIL/uL — AB (ref 3.87–5.11)
RDW: 13.2 % (ref 11.5–15.5)
WBC: 10.1 10*3/uL (ref 4.0–10.5)

## 2017-10-04 LAB — TYPE AND SCREEN
ABO/RH(D): B POS
Antibody Screen: NEGATIVE

## 2017-10-04 MED ORDER — IBUPROFEN 600 MG PO TABS
600.0000 mg | ORAL_TABLET | Freq: Four times a day (QID) | ORAL | Status: DC
  Administered 2017-10-04 – 2017-10-06 (×5): 600 mg via ORAL
  Filled 2017-10-04 (×5): qty 1

## 2017-10-04 MED ORDER — FLEET ENEMA 7-19 GM/118ML RE ENEM: 1.0000 | ENEMA | RECTAL | Status: DC | PRN

## 2017-10-04 MED ORDER — NYSTATIN 100000 UNIT/ML MT SUSP
5.0000 mL | Freq: Four times a day (QID) | OROMUCOSAL | Status: DC
  Filled 2017-10-04 (×3): qty 5

## 2017-10-04 MED ORDER — ZOLPIDEM TARTRATE 5 MG PO TABS
5.0000 mg | ORAL_TABLET | Freq: Every evening | ORAL | Status: DC | PRN
  Filled 2017-10-04: qty 1

## 2017-10-04 MED ORDER — SOD CITRATE-CITRIC ACID 500-334 MG/5ML PO SOLN: 30.0000 mL | ORAL | Status: DC | PRN

## 2017-10-04 MED ORDER — EPHEDRINE 5 MG/ML INJ
10.0000 mg | INTRAVENOUS | Status: DC | PRN
  Filled 2017-10-04: qty 2

## 2017-10-04 MED ORDER — MISOPROSTOL 25 MCG QUARTER TABLET
25.0000 ug | ORAL_TABLET | ORAL | Status: DC | PRN
  Administered 2017-10-04 (×2): 25 ug via VAGINAL
  Filled 2017-10-04 (×6): qty 1

## 2017-10-04 MED ORDER — OXYCODONE HCL 5 MG PO TABS
5.0000 mg | ORAL_TABLET | ORAL | Status: DC | PRN
  Administered 2017-10-06 (×2): 5 mg via ORAL
  Filled 2017-10-04: qty 1

## 2017-10-04 MED ORDER — ONDANSETRON HCL 4 MG/2ML IJ SOLN: 4.0000 mg | INTRAMUSCULAR | Status: DC | PRN

## 2017-10-04 MED ORDER — FENTANYL 2.5 MCG/ML BUPIVACAINE 1/10 % EPIDURAL INFUSION (WH - ANES)
14.0000 mL/h | INTRAMUSCULAR | Status: DC | PRN
  Administered 2017-10-04 (×2): 14 mL/h via EPIDURAL
  Filled 2017-10-04 (×2): qty 100

## 2017-10-04 MED ORDER — BENZOCAINE-MENTHOL 20-0.5 % EX AERO
1.0000 "application " | INHALATION_SPRAY | CUTANEOUS | Status: DC | PRN
  Administered 2017-10-05: 1 via TOPICAL
  Filled 2017-10-04: qty 56

## 2017-10-04 MED ORDER — LACTATED RINGERS IV SOLN: 500.0000 mL | INTRAVENOUS | Status: DC | PRN

## 2017-10-04 MED ORDER — VANCOMYCIN 50 MG/ML ORAL SOLUTION
125.0000 mg | Freq: Two times a day (BID) | ORAL | Status: DC
  Administered 2017-10-04 – 2017-10-06 (×4): 125 mg via ORAL
  Filled 2017-10-04 (×6): qty 2.5

## 2017-10-04 MED ORDER — HYDROXYZINE HCL 50 MG PO TABS
50.0000 mg | ORAL_TABLET | Freq: Four times a day (QID) | ORAL | Status: DC | PRN
  Filled 2017-10-04: qty 1

## 2017-10-04 MED ORDER — ONDANSETRON HCL 4 MG PO TABS: 4.0000 mg | ORAL_TABLET | ORAL | Status: DC | PRN

## 2017-10-04 MED ORDER — NON FORMULARY: 150.0000 mg | Freq: Two times a day (BID) | Status: DC

## 2017-10-04 MED ORDER — LACTATED RINGERS IV SOLN
INTRAVENOUS | Status: DC
  Administered 2017-10-04 – 2017-10-05 (×2): via INTRAVENOUS

## 2017-10-04 MED ORDER — LACTATED RINGERS IV SOLN
500.0000 mL | Freq: Once | INTRAVENOUS | Status: AC
  Administered 2017-10-04: 500 mL via INTRAVENOUS

## 2017-10-04 MED ORDER — OXYTOCIN 40 UNITS IN LACTATED RINGERS INFUSION - SIMPLE MED
1.0000 m[IU]/min | INTRAVENOUS | Status: DC
  Administered 2017-10-04: 2 m[IU]/min via INTRAVENOUS
  Filled 2017-10-04: qty 1000

## 2017-10-04 MED ORDER — ACETAMINOPHEN 325 MG PO TABS
650.0000 mg | ORAL_TABLET | ORAL | Status: DC | PRN
  Administered 2017-10-05 (×2): 650 mg via ORAL
  Filled 2017-10-04 (×2): qty 2

## 2017-10-04 MED ORDER — LACTATED RINGERS IV SOLN
INTRAVENOUS | Status: DC
  Administered 2017-10-04 (×2): via INTRAVENOUS

## 2017-10-04 MED ORDER — PRENATAL MULTIVITAMIN CH
1.0000 | ORAL_TABLET | Freq: Every day | ORAL | Status: DC
  Administered 2017-10-06: 1 via ORAL
  Filled 2017-10-04: qty 1

## 2017-10-04 MED ORDER — PROMETHAZINE HCL 25 MG PO TABS: 25.0000 mg | ORAL_TABLET | Freq: Four times a day (QID) | ORAL | Status: DC | PRN

## 2017-10-04 MED ORDER — LIDOCAINE HCL (PF) 1 % IJ SOLN
30.0000 mL | INTRAMUSCULAR | Status: AC | PRN
  Administered 2017-10-04: 30 mL via SUBCUTANEOUS
  Filled 2017-10-04: qty 30

## 2017-10-04 MED ORDER — OXYCODONE-ACETAMINOPHEN 5-325 MG PO TABS: 1.0000 | ORAL_TABLET | ORAL | Status: DC | PRN

## 2017-10-04 MED ORDER — FAMOTIDINE 20 MG PO TABS
20.0000 mg | ORAL_TABLET | Freq: Two times a day (BID) | ORAL | Status: DC
  Administered 2017-10-04 – 2017-10-06 (×3): 20 mg via ORAL
  Filled 2017-10-04 (×3): qty 1

## 2017-10-04 MED ORDER — LIDOCAINE HCL (PF) 1 % IJ SOLN
INTRAMUSCULAR | Status: DC | PRN
  Administered 2017-10-04: 13 mL via EPIDURAL

## 2017-10-04 MED ORDER — OXYCODONE HCL 5 MG PO TABS
10.0000 mg | ORAL_TABLET | ORAL | Status: DC | PRN
  Administered 2017-10-05 – 2017-10-06 (×2): 10 mg via ORAL
  Filled 2017-10-04 (×3): qty 2

## 2017-10-04 MED ORDER — TERBUTALINE SULFATE 1 MG/ML IJ SOLN
0.2500 mg | Freq: Once | INTRAMUSCULAR | Status: DC | PRN
  Filled 2017-10-04: qty 1

## 2017-10-04 MED ORDER — SERTRALINE HCL 50 MG PO TABS
50.0000 mg | ORAL_TABLET | Freq: Every day | ORAL | Status: DC
  Filled 2017-10-04: qty 1

## 2017-10-04 MED ORDER — COCONUT OIL OIL: 1.0000 "application " | TOPICAL_OIL | Status: DC | PRN

## 2017-10-04 MED ORDER — ONDANSETRON HCL 4 MG/2ML IJ SOLN: 4.0000 mg | Freq: Four times a day (QID) | INTRAMUSCULAR | Status: DC | PRN

## 2017-10-04 MED ORDER — PHENYLEPHRINE 40 MCG/ML (10ML) SYRINGE FOR IV PUSH (FOR BLOOD PRESSURE SUPPORT)
80.0000 ug | PREFILLED_SYRINGE | INTRAVENOUS | Status: DC | PRN
  Filled 2017-10-04: qty 5
  Filled 2017-10-04: qty 10

## 2017-10-04 MED ORDER — BUTORPHANOL TARTRATE 1 MG/ML IJ SOLN: 1.0000 mg | INTRAMUSCULAR | Status: DC | PRN

## 2017-10-04 MED ORDER — WITCH HAZEL-GLYCERIN EX PADS: 1.0000 "application " | MEDICATED_PAD | CUTANEOUS | Status: DC | PRN

## 2017-10-04 MED ORDER — OXYTOCIN 40 UNITS IN LACTATED RINGERS INFUSION - SIMPLE MED: 2.5000 [IU]/h | INTRAVENOUS | Status: DC

## 2017-10-04 MED ORDER — OXYTOCIN BOLUS FROM INFUSION
500.0000 mL | Freq: Once | INTRAVENOUS | Status: AC
  Administered 2017-10-04: 500 mL via INTRAVENOUS

## 2017-10-04 MED ORDER — BUDESONIDE 3 MG PO CPEP: 9.0000 mg | ORAL_CAPSULE | Freq: Every morning | ORAL | Status: DC

## 2017-10-04 MED ORDER — ZOLPIDEM TARTRATE 5 MG PO TABS: 5.0000 mg | ORAL_TABLET | Freq: Every evening | ORAL | Status: DC | PRN

## 2017-10-04 MED ORDER — NYSTATIN 100000 UNIT/ML MT SUSP
5.0000 mL | Freq: Four times a day (QID) | OROMUCOSAL | Status: DC
  Administered 2017-10-04 – 2017-10-06 (×6): 500000 [IU] via ORAL
  Filled 2017-10-04 (×11): qty 5

## 2017-10-04 MED ORDER — SERTRALINE HCL 50 MG PO TABS
150.0000 mg | ORAL_TABLET | Freq: Every day | ORAL | Status: DC
  Administered 2017-10-04: 150 mg via ORAL
  Administered 2017-10-05: 100 mg via ORAL
  Administered 2017-10-06: 50 mg via ORAL
  Filled 2017-10-04 (×3): qty 1

## 2017-10-04 MED ORDER — SENNOSIDES-DOCUSATE SODIUM 8.6-50 MG PO TABS
2.0000 | ORAL_TABLET | ORAL | Status: DC
  Filled 2017-10-04: qty 2

## 2017-10-04 MED ORDER — OXYCODONE-ACETAMINOPHEN 5-325 MG PO TABS: 2.0000 | ORAL_TABLET | ORAL | Status: DC | PRN

## 2017-10-04 MED ORDER — ACETAMINOPHEN 325 MG PO TABS: 650.0000 mg | ORAL_TABLET | ORAL | Status: DC | PRN

## 2017-10-04 MED ORDER — DIPHENHYDRAMINE HCL 25 MG PO CAPS: 25.0000 mg | ORAL_CAPSULE | Freq: Four times a day (QID) | ORAL | Status: DC | PRN

## 2017-10-04 MED ORDER — DIPHENHYDRAMINE HCL 50 MG/ML IJ SOLN: 12.5000 mg | INTRAMUSCULAR | Status: DC | PRN

## 2017-10-04 MED ORDER — VANCOMYCIN HCL 125 MG PO CAPS: 125.0000 mg | ORAL_CAPSULE | Freq: Two times a day (BID) | ORAL | Status: DC

## 2017-10-04 MED ORDER — ONDANSETRON 4 MG PO TBDP
4.0000 mg | ORAL_TABLET | Freq: Three times a day (TID) | ORAL | Status: DC | PRN
  Filled 2017-10-04: qty 1

## 2017-10-04 MED ORDER — PHENYLEPHRINE 40 MCG/ML (10ML) SYRINGE FOR IV PUSH (FOR BLOOD PRESSURE SUPPORT)
80.0000 ug | PREFILLED_SYRINGE | INTRAVENOUS | Status: DC | PRN
  Filled 2017-10-04: qty 5

## 2017-10-04 MED ORDER — SIMETHICONE 80 MG PO CHEW: 80.0000 mg | CHEWABLE_TABLET | ORAL | Status: DC | PRN

## 2017-10-04 MED ORDER — PRENATAL MULTIVITAMIN CH: 1.0000 | ORAL_TABLET | Freq: Every day | ORAL | Status: DC

## 2017-10-04 MED ORDER — TETANUS-DIPHTH-ACELL PERTUSSIS 5-2.5-18.5 LF-MCG/0.5 IM SUSP: 0.5000 mL | Freq: Once | INTRAMUSCULAR | Status: DC

## 2017-10-04 MED ORDER — DIBUCAINE 1 % RE OINT: 1.0000 "application " | TOPICAL_OINTMENT | RECTAL | Status: DC | PRN

## 2017-10-04 NOTE — Anesthesia Pain Management Evaluation Note (Signed)
  CRNA Pain Management Visit Note  Patient: Ellen Vasquez, 42 y.o., female  "Hello I am a member of the anesthesia team at [facility]. We have an anesthesia team available at all times to provide care throughout the hospital, including epidural management and anesthesia for C-section. I don't know your plan for the delivery whether it a natural birth, water birth, IV sedation, nitrous supplementation, doula or epidural, but we want to meet your pain goals."   1.Was your pain managed to your expectations on prior hospitalizations?   Yes   2.What is your expectation for pain management during this hospitalization?     Epidural  3.How can we help you reach that goal? epidural  Record the patient's initial score and the patient's pain goal.   Pain: am induction  Pain Goal: less than 4 [facility] wants you to be able to say your pain was always managed very well.  Ailene Ards 10/04/2017

## 2017-10-04 NOTE — Progress Notes (Signed)
S/P Cytotec x 2 FHT cat one UCs irregular Abdomen no epigastric tenderness DTR 2+ D/W patient two stage IOL

## 2017-10-04 NOTE — Progress Notes (Addendum)
Spoke with Tanya, infection prevention clinician on call regarding prevention of transmission of C. diff to infant to clarify recommendations. Recommendation is for great handwashing technique with soap and water for mom and staff and washing mom with soap and water prior to skin-to-skin contact. Also, to discuss with peds how quickly infant can be bathed. RN to call nursery RN to verify and get plan in place for delivery.

## 2017-10-04 NOTE — Progress Notes (Signed)
FHT cat one UCs irregular Cx 2/50/-3/vtx Begin pitocin Epidural prn

## 2017-10-04 NOTE — Progress Notes (Signed)
Epidural in>comfortable FHT cat one Cx 2/70/-2 AROM clear

## 2017-10-04 NOTE — Anesthesia Procedure Notes (Signed)
Epidural Patient location during procedure: OB Start time: 10/04/2017 10:33 AM End time: 10/04/2017 10:49 AM  Staffing Anesthesiologist: Lynda Rainwater, MD Performed: anesthesiologist   Preanesthetic Checklist Completed: patient identified, site marked, surgical consent, pre-op evaluation, timeout performed, IV checked, risks and benefits discussed and monitors and equipment checked  Epidural Patient position: sitting Prep: ChloraPrep Patient monitoring: heart rate, cardiac monitor, continuous pulse ox and blood pressure Approach: midline Location: L2-L3 Injection technique: LOR saline  Needle:  Needle type: Tuohy  Needle gauge: 17 G Needle length: 9 cm Needle insertion depth: 5 cm Catheter type: closed end flexible Catheter size: 20 Guage Catheter at skin depth: 9 cm Test dose: negative  Assessment Events: blood not aspirated, injection not painful, no injection resistance, negative IV test and no paresthesia  Additional Notes Reason for block:procedure for pain

## 2017-10-04 NOTE — Progress Notes (Signed)
Delivery Note At 4:52 PM a viable female was delivered via  (Presentation: LOA;  ).  APGAR: , ; weight  .   Placenta status:intact ,to pathology .  Cord: 3 vessels  with the following complications:nuchal cord x 2-tight .  Cord pH: pending  Anesthesia:  epidural Episiotomy:  none Lacerations:  Second degree ML lac repaired Suture Repair: 2.0 vicryl Est. Blood Loss (mL):  250  Maternal chest and abdomen prepped for baby immediately prior to rapid delivery (2 UCs) per ID recommendation. No BM during second stage Mom to postpartum.  Baby to Couplet care / Skin to Skin.  Ellen Vasquez II 10/04/2017, 5:08 PM

## 2017-10-04 NOTE — Progress Notes (Signed)
Pt states entocort Ec was D/Ced prior to delivery. Pt does not plan to take medication in the am. Doctor called, waiting for response.  [name]

## 2017-10-04 NOTE — Anesthesia Preprocedure Evaluation (Signed)
Anesthesia Evaluation  Patient identified by MRN, date of birth, ID band Patient awake    Reviewed: Allergy & Precautions, Patient's Chart, lab work & pertinent test results  Airway Mallampati: III  TM Distance: >3 FB Neck ROM: Full    Dental no notable dental hx. (+) Teeth Intact   Pulmonary former smoker,    Pulmonary exam normal breath sounds clear to auscultation       Cardiovascular negative cardio ROS Normal cardiovascular exam Rhythm:Regular Rate:Normal     Neuro/Psych  Headaches, PSYCHIATRIC DISORDERS Anxiety Depression Chiari Type I Malformation    GI/Hepatic Neg liver ROS, GERD  Medicated and Controlled,  Endo/Other  negative endocrine ROS  Renal/GU negative Renal ROS  negative genitourinary   Musculoskeletal negative musculoskeletal ROS (+)   Abdominal   Peds  Hematology  (+) anemia ,   Anesthesia Other Findings   Reproductive/Obstetrics (+) Pregnancy                             Anesthesia Physical  Anesthesia Plan  ASA: II  Anesthesia Plan: Epidural   Post-op Pain Management:    Induction:   PONV Risk Score and Plan:   Airway Management Planned: Natural Airway  Additional Equipment:   Intra-op Plan:   Post-operative Plan:   Informed Consent: I have reviewed the patients History and Physical, chart, labs and discussed the procedure including the risks, benefits and alternatives for the proposed anesthesia with the patient or authorized representative who has indicated his/her understanding and acceptance.     Plan Discussed with: Anesthesiologist  Anesthesia Plan Comments:         Anesthesia Quick Evaluation

## 2017-10-05 ENCOUNTER — Encounter (HOSPITAL_COMMUNITY)
Admission: AD | Disposition: A | Discharge status: Home / Self Care | Payer: Self-pay | Source: Home / Self Care | Attending: Obstetrics and Gynecology

## 2017-10-05 ENCOUNTER — Inpatient Hospital Stay (HOSPITAL_COMMUNITY): Payer: BLUE CROSS/BLUE SHIELD | Admitting: Anesthesiology

## 2017-10-05 ENCOUNTER — Encounter (HOSPITAL_COMMUNITY): Payer: Self-pay | Admitting: Obstetrics and Gynecology

## 2017-10-05 HISTORY — PX: TUBAL LIGATION: SHX77

## 2017-10-05 LAB — CBC
HCT: 32.4 % — ABNORMAL LOW (ref 36.0–46.0)
Hemoglobin: 11 g/dL — ABNORMAL LOW (ref 12.0–15.0)
MCH: 30 pg (ref 26.0–34.0)
MCHC: 34 g/dL (ref 30.0–36.0)
MCV: 88.3 fL (ref 78.0–100.0)
PLATELETS: 181 10*3/uL (ref 150–400)
RBC: 3.67 MIL/uL — AB (ref 3.87–5.11)
RDW: 13 % (ref 11.5–15.5)
WBC: 11.7 10*3/uL — ABNORMAL HIGH (ref 4.0–10.5)

## 2017-10-05 SURGERY — LIGATION, FALLOPIAN TUBE, POSTPARTUM
Anesthesia: Spinal

## 2017-10-05 MED ORDER — LACTATED RINGERS IV SOLN
INTRAVENOUS | Status: DC
  Administered 2017-10-05: 13:00:00 via INTRAVENOUS

## 2017-10-05 MED ORDER — METOCLOPRAMIDE HCL 10 MG PO TABS
10.0000 mg | ORAL_TABLET | Freq: Once | ORAL | Status: AC
  Administered 2017-10-05: 10 mg via ORAL
  Filled 2017-10-05: qty 1

## 2017-10-05 MED ORDER — FENTANYL CITRATE (PF) 100 MCG/2ML IJ SOLN
INTRAMUSCULAR | Status: AC
  Filled 2017-10-05: qty 2

## 2017-10-05 MED ORDER — BUPIVACAINE-EPINEPHRINE (PF) 0.25% -1:200000 IJ SOLN
INTRAMUSCULAR | Status: DC | PRN
  Administered 2017-10-05: 8 mL

## 2017-10-05 MED ORDER — BUPIVACAINE HCL (PF) 0.25 % IJ SOLN
INTRAMUSCULAR | Status: AC
  Filled 2017-10-05: qty 30

## 2017-10-05 MED ORDER — BUPIVACAINE IN DEXTROSE 0.75-8.25 % IT SOLN
INTRATHECAL | Status: DC | PRN
  Administered 2017-10-05: 1.6 mL via INTRATHECAL

## 2017-10-05 MED ORDER — KETOROLAC TROMETHAMINE 30 MG/ML IJ SOLN: 30.0000 mg | Freq: Once | INTRAMUSCULAR | Status: AC | PRN

## 2017-10-05 MED ORDER — BUPIVACAINE HCL (PF) 0.25 % IJ SOLN
INTRAMUSCULAR | Status: DC | PRN
  Administered 2017-10-05: 20 mL

## 2017-10-05 MED ORDER — MIDAZOLAM HCL 2 MG/2ML IJ SOLN
INTRAMUSCULAR | Status: AC
  Filled 2017-10-05: qty 2

## 2017-10-05 MED ORDER — LACTATED RINGERS IV SOLN
INTRAVENOUS | Status: DC | PRN
  Administered 2017-10-05: 13:00:00 via INTRAVENOUS

## 2017-10-05 MED ORDER — EPHEDRINE 5 MG/ML INJ
INTRAVENOUS | Status: AC
  Filled 2017-10-05: qty 10

## 2017-10-05 MED ORDER — HYDROMORPHONE HCL 1 MG/ML IJ SOLN
0.5000 mg | INTRAMUSCULAR | Status: DC | PRN
  Administered 2017-10-05: 0.5 mg via INTRAVENOUS
  Filled 2017-10-05: qty 1

## 2017-10-05 MED ORDER — PROPOFOL 10 MG/ML IV BOLUS
INTRAVENOUS | Status: AC
  Filled 2017-10-05: qty 20

## 2017-10-05 MED ORDER — METRONIDAZOLE IN NACL 5-0.79 MG/ML-% IV SOLN
500.0000 mg | Freq: Once | INTRAVENOUS | Status: DC
  Filled 2017-10-05: qty 100

## 2017-10-05 MED ORDER — MIDAZOLAM HCL 2 MG/2ML IJ SOLN
INTRAMUSCULAR | Status: DC | PRN
  Administered 2017-10-05: 2 mg via INTRAVENOUS

## 2017-10-05 MED ORDER — KETOROLAC TROMETHAMINE 30 MG/ML IJ SOLN
INTRAMUSCULAR | Status: DC | PRN
  Administered 2017-10-05: 30 mg via INTRAVENOUS

## 2017-10-05 MED ORDER — FAMOTIDINE 20 MG PO TABS
40.0000 mg | ORAL_TABLET | Freq: Once | ORAL | Status: AC
  Administered 2017-10-05: 40 mg via ORAL
  Filled 2017-10-05: qty 2

## 2017-10-05 MED ORDER — METRONIDAZOLE IN NACL 5-0.79 MG/ML-% IV SOLN
INTRAVENOUS | Status: DC | PRN
  Administered 2017-10-05: 500 mg via INTRAVENOUS

## 2017-10-05 MED ORDER — DEXAMETHASONE SODIUM PHOSPHATE 4 MG/ML IJ SOLN
INTRAMUSCULAR | Status: AC
  Filled 2017-10-05: qty 1

## 2017-10-05 MED ORDER — ONDANSETRON HCL 4 MG/2ML IJ SOLN
INTRAMUSCULAR | Status: AC
  Filled 2017-10-05: qty 2

## 2017-10-05 MED ORDER — FENTANYL CITRATE (PF) 100 MCG/2ML IJ SOLN
INTRAMUSCULAR | Status: DC | PRN
  Administered 2017-10-05 (×2): 50 ug via INTRAVENOUS

## 2017-10-05 MED ORDER — PROMETHAZINE HCL 25 MG/ML IJ SOLN: 6.2500 mg | INTRAMUSCULAR | Status: DC | PRN

## 2017-10-05 MED ORDER — ONDANSETRON HCL 4 MG/2ML IJ SOLN
INTRAMUSCULAR | Status: DC | PRN
  Administered 2017-10-05: 4 mg via INTRAVENOUS

## 2017-10-05 MED ORDER — LIDOCAINE-EPINEPHRINE (PF) 2 %-1:200000 IJ SOLN
INTRAMUSCULAR | Status: AC
  Filled 2017-10-05: qty 20

## 2017-10-05 MED ORDER — KETOROLAC TROMETHAMINE 30 MG/ML IJ SOLN
INTRAMUSCULAR | Status: AC
  Filled 2017-10-05: qty 1

## 2017-10-05 MED ORDER — SODIUM CHLORIDE 0.9 % IR SOLN
Status: DC | PRN
  Administered 2017-10-05: 1000 mL

## 2017-10-05 MED ORDER — HYDROMORPHONE HCL 1 MG/ML IJ SOLN
0.2500 mg | INTRAMUSCULAR | Status: DC | PRN
  Administered 2017-10-05: 0.5 mg via INTRAVENOUS
  Filled 2017-10-05: qty 1

## 2017-10-05 MED ORDER — MEPERIDINE HCL 25 MG/ML IJ SOLN: 6.2500 mg | INTRAMUSCULAR | Status: DC | PRN

## 2017-10-05 SURGICAL SUPPLY — 34 items
ADH SKN CLS APL DERMABOND .7 (GAUZE/BANDAGES/DRESSINGS)
APL SKNCLS STERI-STRIP NONHPOA (GAUZE/BANDAGES/DRESSINGS)
BENZOIN TINCTURE PRP APPL 2/3 (GAUZE/BANDAGES/DRESSINGS) IMPLANT
CLOSURE WOUND 1/4 X3 (GAUZE/BANDAGES/DRESSINGS)
CLOTH BEACON ORANGE TIMEOUT ST (SAFETY) ×3 IMPLANT
DECANTER SPIKE VIAL GLASS SM (MISCELLANEOUS) ×3 IMPLANT
DERMABOND ADVANCED (GAUZE/BANDAGES/DRESSINGS)
DERMABOND ADVANCED .7 DNX12 (GAUZE/BANDAGES/DRESSINGS) IMPLANT
DRSG OPSITE POSTOP 3X4 (GAUZE/BANDAGES/DRESSINGS) ×3 IMPLANT
ELECT REM PT RETURN 9FT ADLT (ELECTROSURGICAL) ×6
ELECTRODE REM PT RTRN 9FT ADLT (ELECTROSURGICAL) ×2 IMPLANT
GLOVE BIO SURGEON STRL SZ7.5 (GLOVE) ×6 IMPLANT
GLOVE BIOGEL PI IND STRL 7.0 (GLOVE) ×1 IMPLANT
GLOVE BIOGEL PI IND STRL 8 (GLOVE) ×1 IMPLANT
GLOVE BIOGEL PI INDICATOR 7.0 (GLOVE) ×2
GLOVE BIOGEL PI INDICATOR 8 (GLOVE) ×2
GOWN STRL REUS W/TWL LRG LVL3 (GOWN DISPOSABLE) ×3 IMPLANT
NDL SAFETY ECLIPSE 18X1.5 (NEEDLE) ×1 IMPLANT
NEEDLE HYPO 18GX1.5 SHARP (NEEDLE) ×3
NEEDLE HYPO 22GX1.5 SAFETY (NEEDLE) ×3 IMPLANT
NS IRRIG 1000ML POUR BTL (IV SOLUTION) ×3 IMPLANT
PACK ABDOMINAL MINOR (CUSTOM PROCEDURE TRAY) ×3 IMPLANT
PENCIL BUTTON HOLSTER BLD 10FT (ELECTRODE) ×3 IMPLANT
PROTECTOR NERVE ULNAR (MISCELLANEOUS) ×3 IMPLANT
SPONGE LAP 4X18 X RAY DECT (DISPOSABLE) ×3 IMPLANT
STRIP CLOSURE SKIN 1/4X3 (GAUZE/BANDAGES/DRESSINGS) IMPLANT
SUT PLAIN 0 NONE (SUTURE) ×3 IMPLANT
SUT VIC AB 4-0 PS2 27 (SUTURE) ×3 IMPLANT
SUT VICRYL 0 UR6 27IN ABS (SUTURE) ×3 IMPLANT
SUT VICRYL 4-0 PS2 18IN ABS (SUTURE) ×3 IMPLANT
SYR CONTROL 10ML LL (SYRINGE) ×3 IMPLANT
TOWEL OR 17X24 6PK STRL BLUE (TOWEL DISPOSABLE) ×6 IMPLANT
TRAY FOLEY CATH SILVER 14FR (SET/KITS/TRAYS/PACK) ×3 IMPLANT
WATER STERILE IRR 1000ML POUR (IV SOLUTION) ×3 IMPLANT

## 2017-10-05 NOTE — Anesthesia Preprocedure Evaluation (Signed)
Anesthesia Evaluation  Patient identified by MRN, date of birth, ID band Patient awake    Reviewed: Allergy & Precautions, NPO status , Patient's Chart, lab work & pertinent test results  Airway Mallampati: III  TM Distance: >3 FB Neck ROM: Full    Dental no notable dental hx. (+) Teeth Intact   Pulmonary former smoker,    Pulmonary exam normal breath sounds clear to auscultation       Cardiovascular negative cardio ROS Normal cardiovascular exam Rhythm:Regular Rate:Normal     Neuro/Psych  Headaches, PSYCHIATRIC DISORDERS Anxiety Depression Chiari Type I Malformation    GI/Hepatic Neg liver ROS, GERD  Medicated and Controlled,  Endo/Other  negative endocrine ROS  Renal/GU negative Renal ROS  negative genitourinary   Musculoskeletal negative musculoskeletal ROS (+)   Abdominal Normal abdominal exam  (+)   Peds  Hematology  (+) anemia ,   Anesthesia Other Findings   Reproductive/Obstetrics (+) Pregnancy                             Anesthesia Physical  Anesthesia Plan  ASA: II  Anesthesia Plan: Spinal   Post-op Pain Management:    Induction:   PONV Risk Score and Plan:   Airway Management Planned: Natural Airway and Nasal Cannula  Additional Equipment:   Intra-op Plan:   Post-operative Plan:   Informed Consent: I have reviewed the patients History and Physical, chart, labs and discussed the procedure including the risks, benefits and alternatives for the proposed anesthesia with the patient or authorized representative who has indicated his/her understanding and acceptance.     Plan Discussed with: CRNA and Surgeon  Anesthesia Plan Comments:         Anesthesia Quick Evaluation

## 2017-10-05 NOTE — Lactation Note (Signed)
This note was copied from a baby's chart. Lactation Consultation Note  Patient Name: Ellen Vasquez [id number] Date: 10/05/2017 Reason for consult: Initial assessment;Early term 37-38.6wks Breastfeeding consultation services and support information given to patient.  Mom states breastfeeding went well with her first baby but she had low milk supply with the 3 more recent  babies.  Baby is 37 weeks and latching with ease.  Mom comfortable with feeding.  She would like to begin pumping in addition to latching to support milk supply.  Symphony pump set up and initiated.  Instructed to feed with cues and post pump every 3 hours giving back any expressed milk to baby.  Mom has a Medela pump in style at home.  Baby has been supplemented twice with formula when not acting satisfied from breast.  Encouraged to call for assist/concerns prn.  Maternal Data Does the patient have breastfeeding experience prior to this delivery?: Yes  Feeding Feeding Type: Breast Fed  LATCH Score Latch: Grasps breast easily, tongue down, lips flanged, rhythmical sucking.  Audible Swallowing: Spontaneous and intermittent  Type of Nipple: Everted at rest and after stimulation  Comfort (Breast/Nipple): Soft / non-tender  Hold (Positioning): Assistance needed to correctly position infant at breast and maintain latch.  LATCH Score: 9  Interventions    Lactation Tools Discussed/Used Pump Review: Setup, frequency, and cleaning;Milk Storage Initiated by:: LM Date initiated:: 10/05/17   Consult Status Consult Status: Follow-up Date: 10/06/17 Follow-up type: In-patient    Ave Filter 10/05/2017, 11:05 AM

## 2017-10-05 NOTE — Anesthesia Postprocedure Evaluation (Signed)
Anesthesia Post Note  Patient: Ellen Vasquez  Procedure(s) Performed: AN AD HOC LABOR EPIDURAL     Patient location during evaluation: Mother Baby Anesthesia Type: Epidural Level of consciousness: awake and alert Pain management: pain level controlled Vital Signs Assessment: post-procedure vital signs reviewed and stable Respiratory status: spontaneous breathing, nonlabored ventilation and respiratory function stable Cardiovascular status: stable Postop Assessment: no headache, no backache and epidural receding Anesthetic complications: no    Last Vitals:  Vitals:   10/05/17 0045 10/05/17 0434  BP: 114/63 119/88  Pulse: 79 67  Resp: 18 16  Temp: 36.7 C 36.8 C  SpO2:      Last Pain:  Vitals:   10/05/17 0440  TempSrc:   PainSc: 6    Pain Goal: Patients Stated Pain Goal: 1 (10/03/17 2130)               Riki Sheer

## 2017-10-05 NOTE — Anesthesia Procedure Notes (Signed)
Spinal  Start time: 10/05/2017 1:39 PM End time: 10/05/2017 1:41 PM Staffing Anesthesiologist: Lyn Hollingshead, MD Preanesthetic Checklist Completed: patient identified, site marked, surgical consent, pre-op evaluation, timeout performed, IV checked, risks and benefits discussed and monitors and equipment checked Spinal Block Patient position: sitting Prep: site prepped and draped and DuraPrep Patient monitoring: continuous pulse ox and blood pressure Location: L3-4 Injection technique: single-shot Needle Needle type: Pencan  Needle gauge: 24 G Needle length: 10 cm Needle insertion depth: 6 cm Assessment Sensory level: T6

## 2017-10-05 NOTE — Transfer of Care (Signed)
Immediate Anesthesia Transfer of Care Note  Patient: Ellen Vasquez  Procedure(s) Performed: POST PARTUM TUBAL LIGATION (N/A )  Patient Location: PACU  Anesthesia Type:Spinal  Level of Consciousness: Awake  Airway & Oxygen Therapy: Natural and none  Post-op Assessment: Doing well  Post vital signs: VSSAF  Last Vitals:  Vitals Value Taken Time  BP    Temp    Pulse    Resp    SpO2      Last Pain:  Vitals:   10/05/17 1656  TempSrc:   PainSc: 4       Patients Stated Pain Goal: 3 (71/27/87 1836)  Complications: None

## 2017-10-05 NOTE — Progress Notes (Signed)
09/29/2017 - 10/05/2017  2:24 PM  PATIENT:  Ellen Vasquez  42 y.o. female  PRE-OPERATIVE DIAGNOSIS:  add on Post-partum Bilateral Tubal Ligation   POST-OPERATIVE DIAGNOSIS:  desires permanent sterilization   PROCEDURE:  Procedure(s): POST PARTUM TUBAL LIGATION (N/A)  SURGEON:  Surgeon(s) and Role:    Everlene Farrier, MD - Primary  PHYSICIAN ASSISTANT:   ASSISTANTS: none   ANESTHESIA:   spinal  EBL:  15 mL   BLOOD ADMINISTERED:none  DRAINS: foley   LOCAL MEDICATIONS USED:  MARCAINE    and Amount: 8 ml  SPECIMEN:  Source of Specimen:  bilateral fallopian tube segments  DISPOSITION OF SPECIMEN:  PATHOLOGY  COUNTS:  YES  TOURNIQUET:  * No tourniquets in log *  DICTATION: .Other Dictation: Dictation Number [id number]  PLAN OF CARE: Admit to inpatient   PATIENT DISPOSITION:  PACU - hemodynamically stable.   Delay start of Pharmacological VTE agent (>24hrs) due to surgical blood loss or risk of bleeding: not applicable

## 2017-10-05 NOTE — Progress Notes (Signed)
Reviewed with patient procedure-bilateral tubal ligation Reviewed risks including infection, organ damage, bleeding/transfusion, DVT/PE, pneumonia. D/W permanence, failure rate, increased ectopic risks. Patient states she understands and agrees.

## 2017-10-05 NOTE — Progress Notes (Signed)
Post Partum Day 1 Subjective: no complaints. Only one loose BM since delivery  Objective: Blood pressure 102/74, pulse 74, temperature 98 F (36.7 C), temperature source Oral, resp. rate 16, height 5\' 9"  (1.753 m), weight 85.4 kg, last menstrual period 09/05/2016, SpO2 100 %, unknown if currently breastfeeding.  Physical Exam:  General: alert and cooperative Lochia: appropriate Uterine Fundus: firm Incision: n/a DVT Evaluation: No evidence of DVT seen on physical exam.  Recent Labs    10/04/17 0538 10/05/17 0529  HGB 9.5* 11.0*  HCT 29.1* 32.4*    Assessment/Plan: Plan for discharge tomorrow  Continue current IV Abs and GI meds Desires circ - r/b/a discussed and informed consent Desires sterilization - NPO, scheduled at noon today    LOS: 6 days   Marylynn Pearson 10/05/2017, 8:58 AM

## 2017-10-05 NOTE — Progress Notes (Signed)
MOB was referred for history of depression/anxiety. * Referral screened out by Clinical Social Worker because none of the following criteria appear to apply: ~ History of anxiety/depression during this pregnancy, or of post-partum depression following prior delivery. ~ Diagnosis of anxiety and/or depression within last 3 years OR * MOB's symptoms currently being treated with medication and/or therapy. Please contact the Clinical Social Worker if needs arise, by [facility] request, or if MOB scores greater than 9/yes to question 10 on Edinburgh Postpartum Depression Screen.  MOB has Rx for Zoloft.

## 2017-10-05 NOTE — Anesthesia Postprocedure Evaluation (Signed)
Anesthesia Post Note  Patient: Ellen Vasquez  Procedure(s) Performed: POST PARTUM TUBAL LIGATION (N/A )     Patient location during evaluation: PACU Anesthesia Type: Spinal Level of consciousness: awake Pain management: pain level controlled Vital Signs Assessment: post-procedure vital signs reviewed and stable Respiratory status: spontaneous breathing Cardiovascular status: stable Postop Assessment: no headache, no backache, spinal receding, patient able to bend at knees and no apparent nausea or vomiting Anesthetic complications: no    Last Vitals:  Vitals:   10/05/17 1515 10/05/17 1530  BP: 98/64 102/67  Pulse: 60 61  Resp: 11 12  Temp:    SpO2: 98% 97%    Last Pain:  Vitals:   10/05/17 1530  TempSrc:   PainSc: Asleep   Pain Goal: Patients Stated Pain Goal: 3 (10/05/17 1125)               Sedra Morfin JR,JOHN Mateo Flow

## 2017-10-05 NOTE — Op Note (Signed)
NAME: Ellen Vasquez, Ellen Vasquez MEDICAL RECORD [id number] ACCOUNT [id number] DATE OF BIRTH:1976/02/10 FACILITY: [facility] [facility] LOCATION: [place], [place]  OPERATIVE REPORT  DATE OF PROCEDURE:  10/05/2017  PREOPERATIVE DIAGNOSIS:  Desires permanent sterilization.  POSTOPERATIVE DIAGNOSIS:  Desires permanent sterilization.  PROCEDURE:  Postpartum bilateral tubal ligation.  SURGEON:  Everlene Farrier M.D.  ANESTHESIA:  Spinal.  Lyn Hollingshead, M.D.  ESTIMATED BLOOD LOSS:  Drops.  SPECIMENS:  Bilateral fallopian tube segments to pathology.  INDICATIONS AND CONSENTS:  This patient is a 42 year old G4, P4 status post vaginal delivery yesterday of a healthy child.  She desires permanent sterilization.  Potential risks and complications are reviewed preoperatively including but not limited to  infection, organ damage, bleeding requiring transfusion of blood products with HIV and hepatitis acquisition, DVT, PE, pneumonia.  Permanence of the procedure, failure rate, increased ectopic risk also reviewed  The patient states she understands and  agrees and consent is signed on the chart.  DESCRIPTION OF PROCEDURE:  The patient was taken to the operating room where she was identified.  Spinal anesthetic was placed and she was placed in the dorsal supine position.  A Foley catheter was placed.  She was prepped and draped in a sterile  fashion.  Timeout was undertaken.  The infraumbilical area was injected with 0.25% plain Marcaine and a small curvilinear incision was made below the umbilicus.  Dissection was carried out in layers to the peritoneum which was entered.  The left  fallopian tube was identified from cornu to fimbria.  It was grasped in its mid ampullary portion with Babcock clamp.  A knuckle of tube was doubly ligated with two free ties of plain suture.  The intervening knuckle was then sharply resected.  Cautery  was used to assure hemostasis.  Similar procedure was  carried out on the right side.  Excellent hemostasis was noted.  The fascia was closed in a running fashion with a 0 Vicryl suture.  Subcutaneous layer was closed with a single 2-0 Vicryl and the skin  was closed with a subcuticular 4-0 Vicryl.  Dressings are applied.  All counts were correct.  The patient was taken to recovery room in stable condition.  TN/NUANCE  D:10/05/2017 T:10/05/2017 JOB:[id number]

## 2017-10-06 MED ORDER — IBUPROFEN 600 MG PO TABS: 600.0000 mg | ORAL_TABLET | Freq: Four times a day (QID) | ORAL | 0 refills | Status: DC

## 2017-10-06 MED ORDER — OXYCODONE-ACETAMINOPHEN 5-325 MG PO TABS: 1.0000 | ORAL_TABLET | Freq: Four times a day (QID) | ORAL | 0 refills | Status: DC | PRN

## 2017-10-06 NOTE — Anesthesia Postprocedure Evaluation (Signed)
Anesthesia Post Note  Patient: Ellen Vasquez  Procedure(s) Performed: POST PARTUM TUBAL LIGATION (N/A )     Patient location during evaluation: Mother Baby Anesthesia Type: Spinal Level of consciousness: awake and alert Pain management: pain level controlled Vital Signs Assessment: post-procedure vital signs reviewed and stable Respiratory status: spontaneous breathing, nonlabored ventilation and respiratory function stable Cardiovascular status: stable Postop Assessment: no headache, no backache, spinal receding, patient able to bend at knees, no apparent nausea or vomiting, able to ambulate and adequate PO intake Anesthetic complications: no    Last Vitals:  Vitals:   10/06/17 0346 10/06/17 1015  BP: 112/74 117/73  Pulse: 63 67  Resp:  18  Temp: 36.6 C 36.6 C  SpO2: 98% 99%    Last Pain:  Vitals:   10/06/17 1215  TempSrc:   PainSc: 3    Pain Goal: Patients Stated Pain Goal: 3 (10/06/17 1215)               Jabier Mutton

## 2017-10-06 NOTE — Addendum Note (Signed)
Addendum  created 10/06/17 1246 by Hewitt Blade, CRNA   Sign clinical note

## 2017-10-06 NOTE — Discharge Summary (Signed)
Obstetric Discharge Summary Reason for Admission: induction of labor and persistent N+V, C diff diarrhea Prenatal Procedures: multiple adm for N+V+D Intrapartum Procedures: spontaneous vaginal delivery Postpartum Procedures: P.P. tubal ligation Complications-Operative and Postpartum: none Hemoglobin  Date Value Ref Range Status  10/05/2017 11.0 (L) 12.0 - 15.0 g/dL Final   HCT  Date Value Ref Range Status  10/05/2017 32.4 (L) 36.0 - 46.0 % Final    Physical Exam:  General: alert Lochia: appropriate Uterine Fundus: firm Incision: healing well DVT Evaluation: No evidence of DVT seen on physical exam.  Discharge Diagnoses: Term Pregnancy-delivered and persistent NVD, currently on Vancomycin for C diff  Discharge Information: Date: 10/06/2017 Activity: pelvic rest Diet: routine and Percocet or Motrin prn, finish Vancomycin protocol Medications: PNV and Percocet Condition: stable Instructions: refer to practice specific booklet Discharge to: home [place] Schedule an appointment as soon as possible for a visit in 6 week(s).   Contact information: [place] [address] [phone]           Newborn Data: Live born female  Birth Weight: 7 lb 4 oz (3289 g) APGAR: 84, 33  Newborn Delivery   Birth date/time:  10/04/2017 16:52:00 Delivery type:  Vaginal, Spontaneous     Home with mother.  MOTHER HAS F/U WITH GI NEXT WEEK TO RE-ASSESS FOR POSS FECAL TRANSPLANT  Margarette Asal 10/06/2017, 8:16 AM

## 2017-10-06 NOTE — Lactation Note (Signed)
This note was copied from a baby's chart. Lactation Consultation Note  Patient Name: Ellen Vasquez [id number] Date: 10/06/2017 Reason for consult: Follow-up assessment;Early term 37-38.6wks P4, 29 hour female infant , ETI Per mom her feeding plan is BF and formula. Mom is breastfeeding infant then supplementing w/ formula afterwards for most feeds. Per mom, she started using DEBP yesterday and pump 3 times in past 24 hours.  LC discussed with mom engorgement prevention and treatment. Mom encouraged to feed baby w/feeding cues Mom made aware of O/P services, breastfeeding support groups, community resources, and our phone # for post-discharge questions.  Maternal Data Formula Feeding for Exclusion: No  Feeding Feeding Type: Formula  LATCH Score                   Interventions    Lactation Tools Discussed/Used     Consult Status Consult Status: Complete Date: 10/06/17 Follow-up type: Call as needed    Vicente Serene 10/06/2017, 6:51 AM

## 2017-10-11 ENCOUNTER — Inpatient Hospital Stay (HOSPITAL_COMMUNITY)
Admission: AD | Admit: 2017-10-11 | Discharge: 2017-10-12 | Disposition: A | Discharge status: Home / Self Care | Payer: BLUE CROSS/BLUE SHIELD | Source: Ambulatory Visit | Attending: Obstetrics and Gynecology | Admitting: Obstetrics and Gynecology

## 2017-10-11 DIAGNOSIS — Z79891 Long term (current) use of opiate analgesic: Secondary | ICD-10-CM | POA: Insufficient documentation

## 2017-10-11 DIAGNOSIS — Z825 Family history of asthma and other chronic lower respiratory diseases: Secondary | ICD-10-CM | POA: Insufficient documentation

## 2017-10-11 DIAGNOSIS — O165 Unspecified maternal hypertension, complicating the puerperium: Secondary | ICD-10-CM

## 2017-10-11 DIAGNOSIS — G43709 Chronic migraine without aura, not intractable, without status migrainosus: Secondary | ICD-10-CM | POA: Diagnosis not present

## 2017-10-11 DIAGNOSIS — Z87442 Personal history of urinary calculi: Secondary | ICD-10-CM | POA: Insufficient documentation

## 2017-10-11 DIAGNOSIS — Z809 Family history of malignant neoplasm, unspecified: Secondary | ICD-10-CM | POA: Diagnosis not present

## 2017-10-11 DIAGNOSIS — G935 Compression of brain: Secondary | ICD-10-CM | POA: Diagnosis not present

## 2017-10-11 DIAGNOSIS — Z9889 Other specified postprocedural states: Secondary | ICD-10-CM | POA: Insufficient documentation

## 2017-10-11 DIAGNOSIS — Z791 Long term (current) use of non-steroidal anti-inflammatories (NSAID): Secondary | ICD-10-CM | POA: Diagnosis not present

## 2017-10-11 DIAGNOSIS — G43719 Chronic migraine without aura, intractable, without status migrainosus: Secondary | ICD-10-CM | POA: Diagnosis not present

## 2017-10-11 DIAGNOSIS — Z87891 Personal history of nicotine dependence: Secondary | ICD-10-CM | POA: Diagnosis not present

## 2017-10-11 DIAGNOSIS — Z79899 Other long term (current) drug therapy: Secondary | ICD-10-CM | POA: Diagnosis not present

## 2017-10-11 DIAGNOSIS — Z823 Family history of stroke: Secondary | ICD-10-CM | POA: Diagnosis not present

## 2017-10-11 DIAGNOSIS — R42 Dizziness and giddiness: Secondary | ICD-10-CM | POA: Insufficient documentation

## 2017-10-11 DIAGNOSIS — Z91013 Allergy to seafood: Secondary | ICD-10-CM | POA: Insufficient documentation

## 2017-10-11 DIAGNOSIS — Z8261 Family history of arthritis: Secondary | ICD-10-CM | POA: Insufficient documentation

## 2017-10-11 DIAGNOSIS — F329 Major depressive disorder, single episode, unspecified: Secondary | ICD-10-CM | POA: Diagnosis not present

## 2017-10-11 DIAGNOSIS — F419 Anxiety disorder, unspecified: Secondary | ICD-10-CM | POA: Insufficient documentation

## 2017-10-11 DIAGNOSIS — Z8249 Family history of ischemic heart disease and other diseases of the circulatory system: Secondary | ICD-10-CM | POA: Diagnosis not present

## 2017-10-11 DIAGNOSIS — Z888 Allergy status to other drugs, medicaments and biological substances status: Secondary | ICD-10-CM | POA: Insufficient documentation

## 2017-10-11 DIAGNOSIS — Z882 Allergy status to sulfonamides status: Secondary | ICD-10-CM | POA: Insufficient documentation

## 2017-10-11 LAB — URINALYSIS, ROUTINE W REFLEX MICROSCOPIC
Bacteria, UA: NONE SEEN
Bilirubin Urine: NEGATIVE
Glucose, UA: NEGATIVE mg/dL
Ketones, ur: NEGATIVE mg/dL
Nitrite: NEGATIVE
Protein, ur: NEGATIVE mg/dL
Specific Gravity, Urine: 1.009 (ref 1.005–1.030)
pH: 9 — ABNORMAL HIGH (ref 5.0–8.0)

## 2017-10-11 LAB — CBC
HCT: 35.8 % — ABNORMAL LOW (ref 36.0–46.0)
Hemoglobin: 12.1 g/dL (ref 12.0–15.0)
MCH: 29.9 pg (ref 26.0–34.0)
MCHC: 33.8 g/dL (ref 30.0–36.0)
MCV: 88.4 fL (ref 78.0–100.0)
Platelets: 252 10*3/uL (ref 150–400)
RBC: 4.05 MIL/uL (ref 3.87–5.11)
RDW: 12.8 % (ref 11.5–15.5)
WBC: 8.2 10*3/uL (ref 4.0–10.5)

## 2017-10-11 LAB — COMPREHENSIVE METABOLIC PANEL
ALT: 23 U/L (ref 0–44)
AST: 20 U/L (ref 15–41)
Albumin: 3.5 g/dL (ref 3.5–5.0)
Alkaline Phosphatase: 107 U/L (ref 38–126)
Anion gap: 8 (ref 5–15)
BUN: 20 mg/dL (ref 6–20)
CO2: 24 mmol/L (ref 22–32)
Calcium: 9.3 mg/dL (ref 8.9–10.3)
Chloride: 105 mmol/L (ref 98–111)
Creatinine, Ser: 0.86 mg/dL (ref 0.44–1.00)
GFR calc Af Amer: >60 mL/min (ref >60)
GFR calc non Af Amer: >60 mL/min (ref >60)
Glucose, Bld: 90 mg/dL (ref 70–99)
Potassium: 3.8 mmol/L (ref 3.5–5.1)
Sodium: 137 mmol/L (ref 135–145)
Total Bilirubin: 0.1 mg/dL — ABNORMAL LOW (ref 0.3–1.2)
Total Protein: 7 g/dL (ref 6.5–8.1)

## 2017-10-11 LAB — PROTEIN / CREATININE RATIO, URINE
Creatinine, Urine: 41 mg/dL
Total Protein, Urine: <6 mg/dL

## 2017-10-11 MED ORDER — ACETAMINOPHEN 500 MG PO TABS
1000.0000 mg | ORAL_TABLET | Freq: Once | ORAL | Status: AC
  Administered 2017-10-11: 1000 mg via ORAL
  Filled 2017-10-11: qty 2

## 2017-10-11 NOTE — MAU Note (Signed)
Pt here with c/o high BP at home. Having HA and dizziness. Having swelling as well.

## 2017-10-12 DIAGNOSIS — O165 Unspecified maternal hypertension, complicating the puerperium: Secondary | ICD-10-CM

## 2017-10-12 DIAGNOSIS — G43719 Chronic migraine without aura, intractable, without status migrainosus: Secondary | ICD-10-CM | POA: Diagnosis not present

## 2017-10-12 DIAGNOSIS — R42 Dizziness and giddiness: Secondary | ICD-10-CM | POA: Diagnosis not present

## 2017-10-12 NOTE — MAU Provider Note (Signed)
History     CSN: [id number]  Arrival date and time: 10/11/17 2238   First Provider Initiated Contact with Patient 10/11/17  2325   Hx of migraines, dizziness during pregnancy  Chief Complaint  Patient presents with  . Hypertension   Ellen Vasquez is a 42 y.o. G4P4 who is 7 days PP from a SVD on 8/19. She presents to MAU with complaints of Hypertension and Headache. She reports not feeling well for majority of today, reports HA that started occurring around 11am this morning and continued to get worse- around lunch HA started to be associated with dizziness. Rates pain 4/10- has not taken any medication for HA, reports a hx of migraines that have been occurring during pregnancy and prior to pregnancy. She reports taking her BP last night after not feeling fell and reports a BP of 145/97. She denies complication of Hypertension during pregnancy or labor. She has a hx of C Diff which see gets care [facility] at [facility].   OB History    Gravida  4   Para  4   Term  4   Preterm      AB      Living  4     SAB      TAB      Ectopic      Multiple  0   Live Births  4           Past Medical History:  Diagnosis Date  . Anxiety    zoloft 150 mg   . Chiari malformation type I ([place])    MRI [facility] 2013  . Depression    PPD with 2nd and 3rd baby (zoloft)  . Endometriosis 1997  . Headache    no meds   . Headache in pregnancy   . Hearing loss   . Kidney infection   . Kidney stones    current pregnancy   . Otosclerosis 2008  . Otosclerosis     Past Surgical History:  Procedure Laterality Date  . DILATION AND CURETTAGE OF UTERUS  May 2015  . LAPAROSCOPIC ABDOMINAL EXPLORATION  06/2013   has had 4 surgeries  . SHOULDER SURGERY  2000  . TONSILLECTOMY AND ADENOIDECTOMY  2010  . TUBAL LIGATION N/A 10/05/2017   Procedure: POST PARTUM TUBAL LIGATION;  Surgeon: Everlene Farrier, MD;  Location: [place];  Service: Gynecology;  Laterality: N/A;  . WISDOM TOOTH  EXTRACTION  2008    Family History  Problem Relation Age of Onset  . Asthma Father   . Arthritis Father        Rheumatoid  . Cancer Maternal Grandmother        skin  . Hypertension Other   . Hyperlipidemia Other   . Stroke Other   . Obesity Other   . Aneurysm Paternal Grandfather        brain  . Skin cancer Paternal Grandmother   . Migraines Neg Hx     Social History   Tobacco Use  . Smoking status: Former Smoker    Types: Cigarettes    Last attempt to quit: 11/25/2001    Years since quitting: 15.8  . Smokeless tobacco: Never Used  Substance Use Topics  . Alcohol use: Not Currently    Alcohol/week: 1.0 standard drinks    Types: 1 Glasses of wine per week    Comment: social events  . Drug use: No    Allergies:  Allergies  Allergen Reactions  . Shellfish Allergy Hives  and Swelling    Does fine with iodine  . Verapamil     GI sx, weakness  . Other     Surgical glue causes a rash  . Sulfa Antibiotics Dermatitis and Rash  . Tape Dermatitis    With derma bond    Medications Prior to Admission  Medication Sig Dispense Refill Last Dose  . vancomycin (VANCOCIN) 125 MG capsule Take 4 times a day for 4 weeks (starting 7/18) Then twice a day for 1 week. Then once a day for 1 week. Then every other day for 2 weeks.   10/11/2017 at Unknown time  . budesonide (ENTOCORT EC) 3 MG 24 hr capsule Take 3 capsules by mouth every morning.   Past Week at Unknown time  . ibuprofen (ADVIL,MOTRIN) 600 MG tablet Take 1 tablet (600 mg total) by mouth every 6 (six) hours. 30 tablet 0   . nystatin (MYCOSTATIN) 100000 UNIT/ML suspension Take 5 mLs by mouth 4 (four) times daily.  0 10/04/2017 at Unknown time  . ondansetron (ZOFRAN-ODT) 4 MG disintegrating tablet Take 4 mg by mouth every 8 (eight) hours as needed for nausea or vomiting.   10/04/2017 at Unknown time  . oxyCODONE-acetaminophen (PERCOCET/ROXICET) 5-325 MG tablet Take 1-2 tablets by mouth every 6 (six) hours as needed for severe  pain. 30 tablet 0   . PRENATAL 27-1 MG TABS Take 1 tablet by mouth daily.   10/04/2017 at Unknown time  . promethazine (PHENERGAN) 25 MG tablet Take 25 mg by mouth every 6 (six) hours as needed for nausea or vomiting.   Past Month at Unknown time  . sertraline (ZOLOFT) 100 MG tablet Take by mouth.   10/04/2017 at Unknown time    Review of Systems  Constitutional:       Hypertension  Respiratory: Negative.   Cardiovascular: Negative.   Gastrointestinal: Negative.   Genitourinary: Negative.   Musculoskeletal: Negative.   Neurological: Positive for dizziness and headaches. Negative for weakness and light-headedness.   Physical Exam   .[facility] Vitals:   10/11/17 2346 10/12/17 0001 10/12/17 0016 10/12/17 0031  BP: 136/74 (!) 142/77 138/73 (!) 141/83  Pulse: (!) 53 (!) 54 (!) 56 (!) 54  Resp:    19  Temp:      TempSrc:      SpO2:      Weight:      Height:       Physical Exam  Nursing note and vitals reviewed. Constitutional: She is oriented to person, place, and time. She appears well-developed and well-nourished. No distress.  HENT:  Head: Normocephalic.  Cardiovascular: Normal rate, regular rhythm and normal heart sounds.  Respiratory: Effort normal and breath sounds normal. No respiratory distress. She has no wheezes.  GI: Soft. Bowel sounds are normal. She exhibits no distension. There is no tenderness.  Musculoskeletal: Normal range of motion. She exhibits edema.  +1 edema on left leg and +2 pitting edema on right leg, DTR +1, no tenderness or redness present on extremities   Neurological: She is alert and oriented to person, place, and time.  Skin: Skin is warm and dry.  Psychiatric: She has a normal mood and affect. Her behavior is normal. Thought content normal.    MAU Course  Procedures  MDM PEC labs- CBC, CMP, PCR  Tylenol 1000mg  for HA  Results for orders placed or performed during the hospital encounter of 10/11/17 (from the past 24 hour(s))  Urinalysis, Routine w  reflex microscopic     Status: Abnormal  Collection Time: 10/11/17 11:02 PM  Result Value Ref Range   Color, Urine STRAW (A) YELLOW   APPearance CLEAR CLEAR   Specific Gravity, Urine 1.009 1.005 - 1.030   pH 9.0 (H) 5.0 - 8.0   Glucose, UA NEGATIVE NEGATIVE mg/dL   Hgb urine dipstick MODERATE (A) NEGATIVE   Bilirubin Urine NEGATIVE NEGATIVE   Ketones, ur NEGATIVE NEGATIVE mg/dL   Protein, ur NEGATIVE NEGATIVE mg/dL   Nitrite NEGATIVE NEGATIVE   Leukocytes, UA TRACE (A) NEGATIVE   RBC / HPF 0-5 0 - 5 RBC/hpf   WBC, UA 6-10 0 - 5 WBC/hpf   Bacteria, UA NONE SEEN NONE SEEN   Squamous Epithelial / LPF 0-5 0 - 5  Protein / creatinine ratio, urine     Status: None   Collection Time: 10/11/17 11:02 PM  Result Value Ref Range   Creatinine, Urine 41.00 mg/dL   Total Protein, Urine <6.0 mg/dL   Protein Creatinine Ratio        0.00 - 0.15 mg/mg[Cre]  CBC     Status: Abnormal   Collection Time: 10/11/17 11:22 PM  Result Value Ref Range   WBC 8.2 4.0 - 10.5 K/uL   RBC 4.05 3.87 - 5.11 MIL/uL   Hemoglobin 12.1 12.0 - 15.0 g/dL   HCT 35.8 (L) 36.0 - 46.0 %   MCV 88.4 78.0 - 100.0 fL   MCH 29.9 26.0 - 34.0 pg   MCHC 33.8 30.0 - 36.0 g/dL   RDW 12.8 11.5 - 15.5 %   Platelets 252 150 - 400 K/uL  Comprehensive metabolic panel     Status: Abnormal   Collection Time: 10/11/17 11:22 PM  Result Value Ref Range   Sodium 137 135 - 145 mmol/L   Potassium 3.8 3.5 - 5.1 mmol/L   Chloride 105 98 - 111 mmol/L   CO2 24 22 - 32 mmol/L   Glucose, Bld 90 70 - 99 mg/dL   BUN 20 6 - 20 mg/dL   Creatinine, Ser 0.86 0.44 - 1.00 mg/dL   Calcium 9.3 8.9 - 10.3 mg/dL   Total Protein 7.0 6.5 - 8.1 g/dL   Albumin 3.5 3.5 - 5.0 g/dL   AST 20 15 - 41 U/L   ALT 23 0 - 44 U/L   Alkaline Phosphatase 107 38 - 126 U/L   Total Bilirubin 0.1 (L) 0.3 - 1.2 mg/dL   GFR calc non Af Amer >60 >60 mL/min   GFR calc Af Amer >60 >60 mL/min   Anion gap 8 5 - 15   PEC labs Negative  Patient reports HA has decreased in  pain level but pressure is still present, she reports having a hx of migraines that Tylenol does not resolve. Offered Migraine/HA cocktail prior to patient being discharged. Pt declines additional medication at this time. Educated and discussed edema and hypertension with reasons to return to MAU immediately. Discussed increasing hydration to 8 bottles of water per day and keeping feet elevated to breast line when resting to resolve edema. Patient verbalizes understanding.   Consult with Dr Royston Sinner with assessment and lab results. Okay to discharge home with PEC precautions and BP check in the office on Friday.  Patient made aware of plan of care and notified to call office in the morning to set BP appointment for Friday. Pt discharged with strict PEC precautions. Pt stable at time of discharge.   Assessment and Plan   1. Postpartum hypertension   2. Dizziness   3. Intractable chronic  migraine without aura and without status migrainosus    Discharge home  BP check in the office Friday  Return to MAU as needed for s/s of [place] 10/12/2017, 12:46 AM

## 2017-10-19 ENCOUNTER — Inpatient Hospital Stay (HOSPITAL_COMMUNITY): Admit: 2017-10-19 | Payer: Self-pay | Admitting: Obstetrics and Gynecology

## 2017-10-19 SURGERY — Surgical Case
Anesthesia: Regional

## 2017-11-16 DIAGNOSIS — Z1389 Encounter for screening for other disorder: Secondary | ICD-10-CM | POA: Diagnosis not present

## 2018-02-02 DIAGNOSIS — J111 Influenza due to unidentified influenza virus with other respiratory manifestations: Secondary | ICD-10-CM | POA: Diagnosis not present

## 2018-02-02 DIAGNOSIS — R509 Fever, unspecified: Secondary | ICD-10-CM | POA: Diagnosis not present

## 2018-04-11 DIAGNOSIS — Z131 Encounter for screening for diabetes mellitus: Secondary | ICD-10-CM | POA: Diagnosis not present

## 2018-04-11 DIAGNOSIS — Z13220 Encounter for screening for lipoid disorders: Secondary | ICD-10-CM | POA: Diagnosis not present

## 2018-04-12 DIAGNOSIS — M79671 Pain in right foot: Secondary | ICD-10-CM | POA: Diagnosis not present

## 2018-04-12 DIAGNOSIS — Z0001 Encounter for general adult medical examination with abnormal findings: Secondary | ICD-10-CM | POA: Diagnosis not present

## 2018-04-13 DIAGNOSIS — Z0001 Encounter for general adult medical examination with abnormal findings: Secondary | ICD-10-CM | POA: Diagnosis not present

## 2018-04-13 DIAGNOSIS — M79671 Pain in right foot: Secondary | ICD-10-CM | POA: Diagnosis not present

## 2018-11-25 DIAGNOSIS — F411 Generalized anxiety disorder: Secondary | ICD-10-CM | POA: Diagnosis not present

## 2018-11-25 DIAGNOSIS — F410 Panic disorder [episodic paroxysmal anxiety] without agoraphobia: Secondary | ICD-10-CM | POA: Diagnosis not present

## 2018-12-05 DIAGNOSIS — F411 Generalized anxiety disorder: Secondary | ICD-10-CM | POA: Diagnosis not present

## 2018-12-05 DIAGNOSIS — F410 Panic disorder [episodic paroxysmal anxiety] without agoraphobia: Secondary | ICD-10-CM | POA: Diagnosis not present

## 2018-12-13 DIAGNOSIS — F411 Generalized anxiety disorder: Secondary | ICD-10-CM | POA: Diagnosis not present

## 2018-12-13 DIAGNOSIS — F410 Panic disorder [episodic paroxysmal anxiety] without agoraphobia: Secondary | ICD-10-CM | POA: Diagnosis not present

## 2018-12-20 DIAGNOSIS — F411 Generalized anxiety disorder: Secondary | ICD-10-CM | POA: Diagnosis not present

## 2018-12-20 DIAGNOSIS — F410 Panic disorder [episodic paroxysmal anxiety] without agoraphobia: Secondary | ICD-10-CM | POA: Diagnosis not present

## 2018-12-25 DIAGNOSIS — J029 Acute pharyngitis, unspecified: Secondary | ICD-10-CM | POA: Diagnosis not present

## 2018-12-25 DIAGNOSIS — Z6824 Body mass index (BMI) 24.0-24.9, adult: Secondary | ICD-10-CM | POA: Diagnosis not present

## 2018-12-25 DIAGNOSIS — Z20828 Contact with and (suspected) exposure to other viral communicable diseases: Secondary | ICD-10-CM | POA: Diagnosis not present

## 2018-12-26 DIAGNOSIS — F411 Generalized anxiety disorder: Secondary | ICD-10-CM | POA: Diagnosis not present

## 2018-12-26 DIAGNOSIS — F410 Panic disorder [episodic paroxysmal anxiety] without agoraphobia: Secondary | ICD-10-CM | POA: Diagnosis not present

## 2019-01-05 DIAGNOSIS — F411 Generalized anxiety disorder: Secondary | ICD-10-CM | POA: Diagnosis not present

## 2019-01-05 DIAGNOSIS — F410 Panic disorder [episodic paroxysmal anxiety] without agoraphobia: Secondary | ICD-10-CM | POA: Diagnosis not present

## 2019-01-16 DIAGNOSIS — F410 Panic disorder [episodic paroxysmal anxiety] without agoraphobia: Secondary | ICD-10-CM | POA: Diagnosis not present

## 2019-01-16 DIAGNOSIS — F411 Generalized anxiety disorder: Secondary | ICD-10-CM | POA: Diagnosis not present

## 2019-01-24 DIAGNOSIS — F410 Panic disorder [episodic paroxysmal anxiety] without agoraphobia: Secondary | ICD-10-CM | POA: Diagnosis not present

## 2019-01-24 DIAGNOSIS — F411 Generalized anxiety disorder: Secondary | ICD-10-CM | POA: Diagnosis not present

## 2019-02-01 DIAGNOSIS — F410 Panic disorder [episodic paroxysmal anxiety] without agoraphobia: Secondary | ICD-10-CM | POA: Diagnosis not present

## 2019-02-01 DIAGNOSIS — F411 Generalized anxiety disorder: Secondary | ICD-10-CM | POA: Diagnosis not present

## 2019-02-20 DIAGNOSIS — F410 Panic disorder [episodic paroxysmal anxiety] without agoraphobia: Secondary | ICD-10-CM | POA: Diagnosis not present

## 2019-02-20 DIAGNOSIS — F411 Generalized anxiety disorder: Secondary | ICD-10-CM | POA: Diagnosis not present

## 2019-02-28 DIAGNOSIS — F411 Generalized anxiety disorder: Secondary | ICD-10-CM | POA: Diagnosis not present

## 2019-02-28 DIAGNOSIS — F410 Panic disorder [episodic paroxysmal anxiety] without agoraphobia: Secondary | ICD-10-CM | POA: Diagnosis not present

## 2019-03-10 DIAGNOSIS — F411 Generalized anxiety disorder: Secondary | ICD-10-CM | POA: Diagnosis not present

## 2019-03-10 DIAGNOSIS — F410 Panic disorder [episodic paroxysmal anxiety] without agoraphobia: Secondary | ICD-10-CM | POA: Diagnosis not present

## 2019-03-20 DIAGNOSIS — F410 Panic disorder [episodic paroxysmal anxiety] without agoraphobia: Secondary | ICD-10-CM | POA: Diagnosis not present

## 2019-03-20 DIAGNOSIS — F411 Generalized anxiety disorder: Secondary | ICD-10-CM | POA: Diagnosis not present

## 2019-03-27 DIAGNOSIS — F411 Generalized anxiety disorder: Secondary | ICD-10-CM | POA: Diagnosis not present

## 2019-03-27 DIAGNOSIS — F410 Panic disorder [episodic paroxysmal anxiety] without agoraphobia: Secondary | ICD-10-CM | POA: Diagnosis not present

## 2019-04-06 DIAGNOSIS — F410 Panic disorder [episodic paroxysmal anxiety] without agoraphobia: Secondary | ICD-10-CM | POA: Diagnosis not present

## 2019-04-06 DIAGNOSIS — F411 Generalized anxiety disorder: Secondary | ICD-10-CM | POA: Diagnosis not present

## 2019-04-20 DIAGNOSIS — F411 Generalized anxiety disorder: Secondary | ICD-10-CM | POA: Diagnosis not present

## 2019-04-20 DIAGNOSIS — F410 Panic disorder [episodic paroxysmal anxiety] without agoraphobia: Secondary | ICD-10-CM | POA: Diagnosis not present

## 2019-05-02 DIAGNOSIS — F410 Panic disorder [episodic paroxysmal anxiety] without agoraphobia: Secondary | ICD-10-CM | POA: Diagnosis not present

## 2019-05-02 DIAGNOSIS — F411 Generalized anxiety disorder: Secondary | ICD-10-CM | POA: Diagnosis not present

## 2019-05-11 DIAGNOSIS — F410 Panic disorder [episodic paroxysmal anxiety] without agoraphobia: Secondary | ICD-10-CM | POA: Diagnosis not present

## 2019-05-11 DIAGNOSIS — F411 Generalized anxiety disorder: Secondary | ICD-10-CM | POA: Diagnosis not present

## 2019-06-01 DIAGNOSIS — F411 Generalized anxiety disorder: Secondary | ICD-10-CM | POA: Diagnosis not present

## 2019-06-01 DIAGNOSIS — F410 Panic disorder [episodic paroxysmal anxiety] without agoraphobia: Secondary | ICD-10-CM | POA: Diagnosis not present

## 2019-07-05 DIAGNOSIS — R21 Rash and other nonspecific skin eruption: Secondary | ICD-10-CM | POA: Diagnosis not present

## 2019-07-11 DIAGNOSIS — F410 Panic disorder [episodic paroxysmal anxiety] without agoraphobia: Secondary | ICD-10-CM | POA: Diagnosis not present

## 2019-07-11 DIAGNOSIS — F411 Generalized anxiety disorder: Secondary | ICD-10-CM | POA: Diagnosis not present

## 2019-07-18 DIAGNOSIS — Z01419 Encounter for gynecological examination (general) (routine) without abnormal findings: Secondary | ICD-10-CM | POA: Diagnosis not present

## 2019-07-18 DIAGNOSIS — Z6826 Body mass index (BMI) 26.0-26.9, adult: Secondary | ICD-10-CM | POA: Diagnosis not present

## 2019-07-18 DIAGNOSIS — F411 Generalized anxiety disorder: Secondary | ICD-10-CM | POA: Diagnosis not present

## 2019-07-18 DIAGNOSIS — Z1231 Encounter for screening mammogram for malignant neoplasm of breast: Secondary | ICD-10-CM | POA: Diagnosis not present

## 2019-07-18 DIAGNOSIS — N803 Endometriosis of pelvic peritoneum: Secondary | ICD-10-CM | POA: Diagnosis not present

## 2019-07-18 DIAGNOSIS — F410 Panic disorder [episodic paroxysmal anxiety] without agoraphobia: Secondary | ICD-10-CM | POA: Diagnosis not present

## 2019-07-31 DIAGNOSIS — F410 Panic disorder [episodic paroxysmal anxiety] without agoraphobia: Secondary | ICD-10-CM | POA: Diagnosis not present

## 2019-07-31 DIAGNOSIS — F411 Generalized anxiety disorder: Secondary | ICD-10-CM | POA: Diagnosis not present

## 2019-09-05 DIAGNOSIS — J0100 Acute maxillary sinusitis, unspecified: Secondary | ICD-10-CM | POA: Diagnosis not present

## 2019-10-12 DIAGNOSIS — R52 Pain, unspecified: Secondary | ICD-10-CM | POA: Diagnosis not present

## 2019-10-12 DIAGNOSIS — U071 COVID-19: Secondary | ICD-10-CM | POA: Diagnosis not present

## 2019-12-13 DIAGNOSIS — H900 Conductive hearing loss, bilateral: Secondary | ICD-10-CM | POA: Diagnosis not present

## 2020-01-01 DIAGNOSIS — F411 Generalized anxiety disorder: Secondary | ICD-10-CM | POA: Diagnosis not present

## 2020-01-01 DIAGNOSIS — F410 Panic disorder [episodic paroxysmal anxiety] without agoraphobia: Secondary | ICD-10-CM | POA: Diagnosis not present

## 2020-01-15 DIAGNOSIS — F411 Generalized anxiety disorder: Secondary | ICD-10-CM | POA: Diagnosis not present

## 2020-01-15 DIAGNOSIS — F410 Panic disorder [episodic paroxysmal anxiety] without agoraphobia: Secondary | ICD-10-CM | POA: Diagnosis not present

## 2020-01-30 DIAGNOSIS — F410 Panic disorder [episodic paroxysmal anxiety] without agoraphobia: Secondary | ICD-10-CM | POA: Diagnosis not present

## 2020-01-30 DIAGNOSIS — F411 Generalized anxiety disorder: Secondary | ICD-10-CM | POA: Diagnosis not present

## 2020-02-22 DIAGNOSIS — F410 Panic disorder [episodic paroxysmal anxiety] without agoraphobia: Secondary | ICD-10-CM | POA: Diagnosis not present

## 2020-02-22 DIAGNOSIS — F411 Generalized anxiety disorder: Secondary | ICD-10-CM | POA: Diagnosis not present

## 2020-02-27 IMAGING — US US ABDOMEN LIMITED
1 series · 15 of 25 positions shown · non-contrast
Comparison: Ob ultrasound 03/27/2017. CT Abdomen and Pelvis
[HOSPITAL] 02/13/2015

CLINICAL DATA: 41-year-old female with abdominal pain vomiting and
diarrhea in the 1st trimester of pregnancy.

EXAM:
ULTRASOUND ABDOMEN LIMITED RIGHT UPPER QUADRANT

[Series 1: us abdomen limited · 15 of 64 slices shown]
[im 1/64]
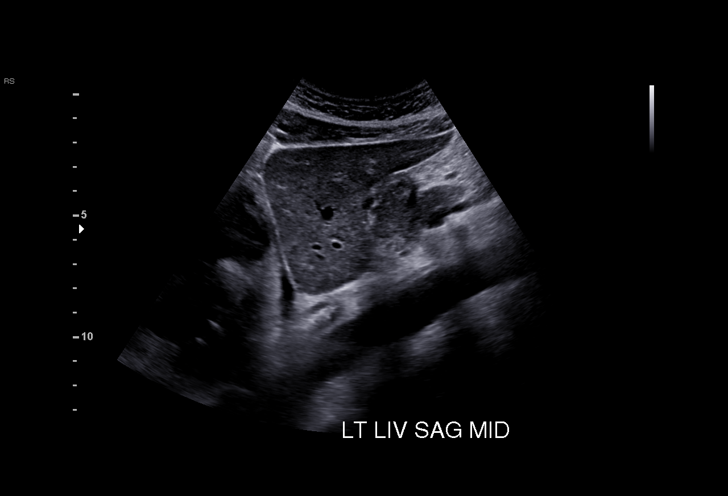
[im 6/64]
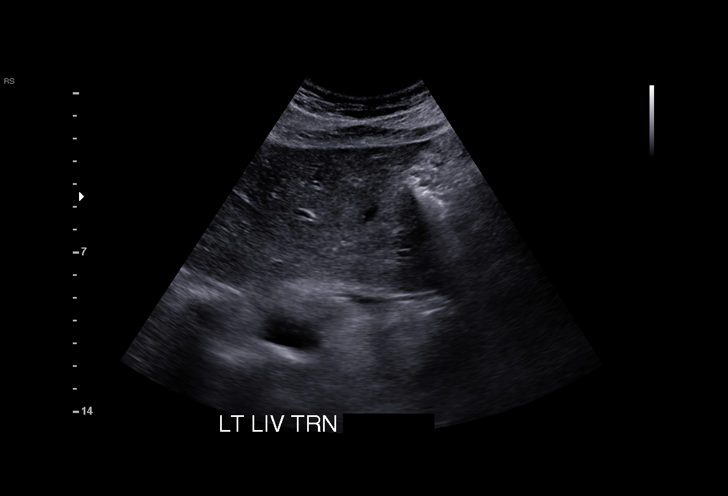
[im 11/64]
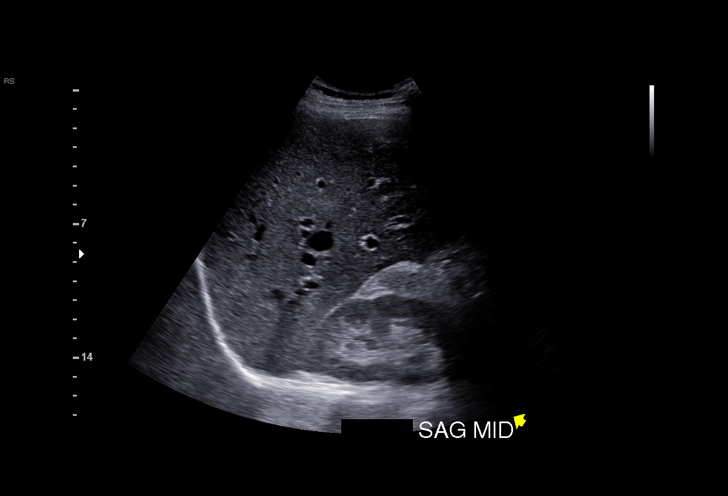
[im 14/64]
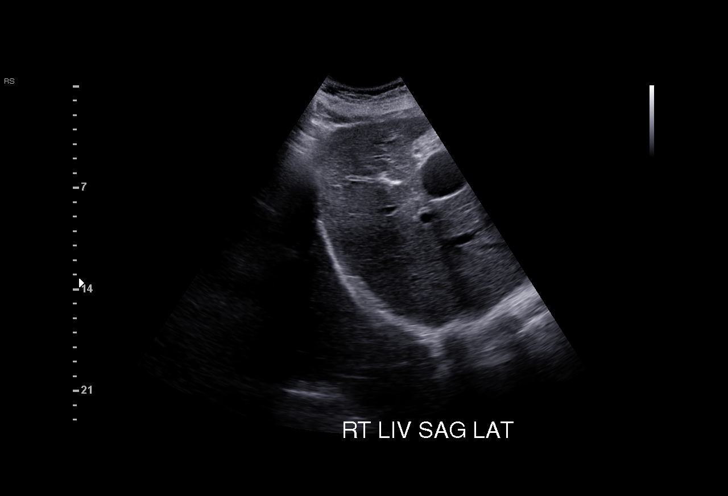
[im 19/64]
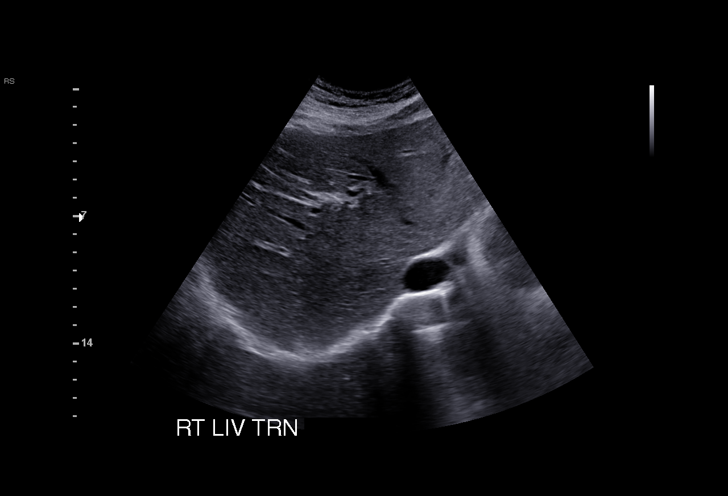
[im 24/64]
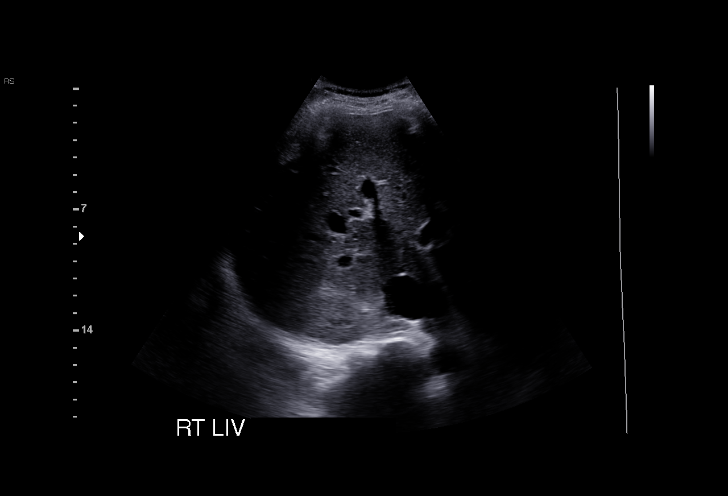
[im 27/64]
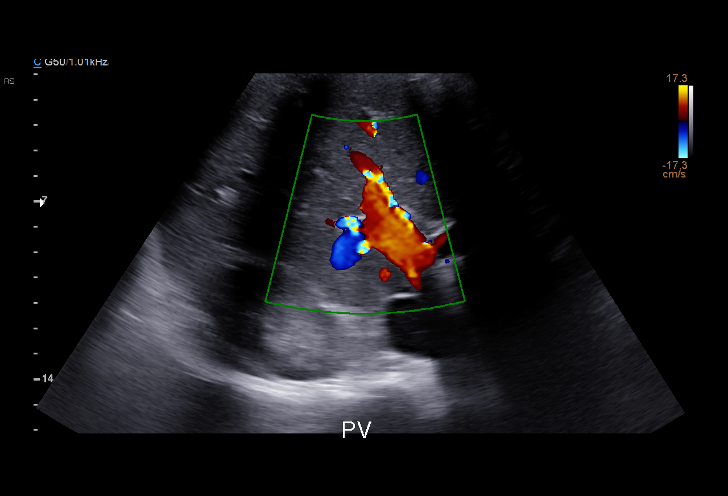
[im 32/64]
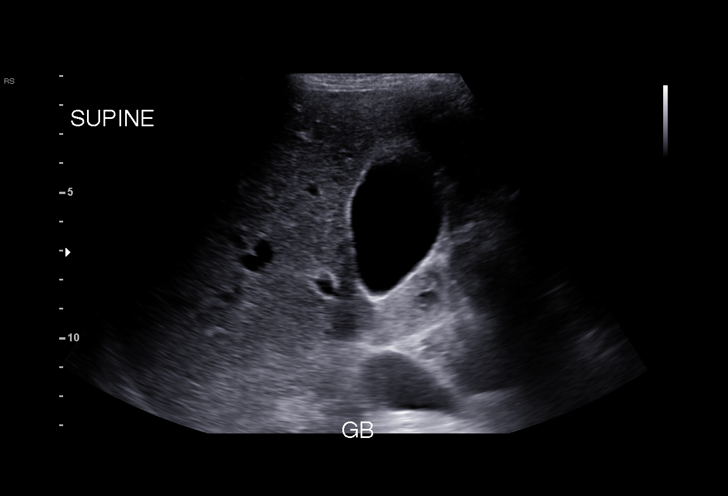
[im 37/64]
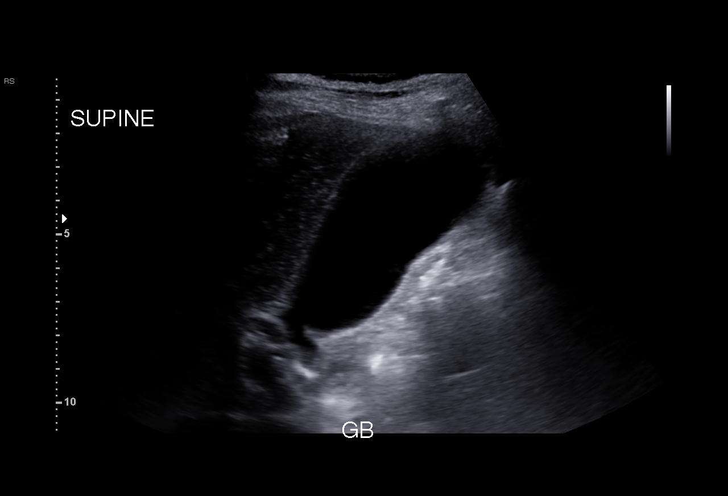
[im 40/64]
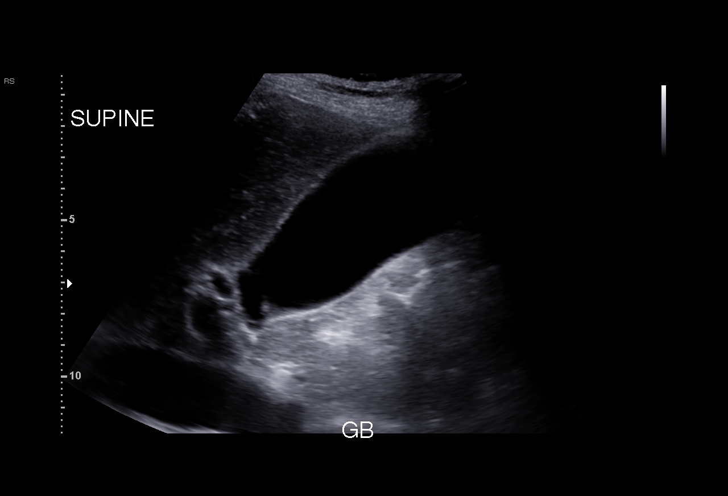
[im 45/64]
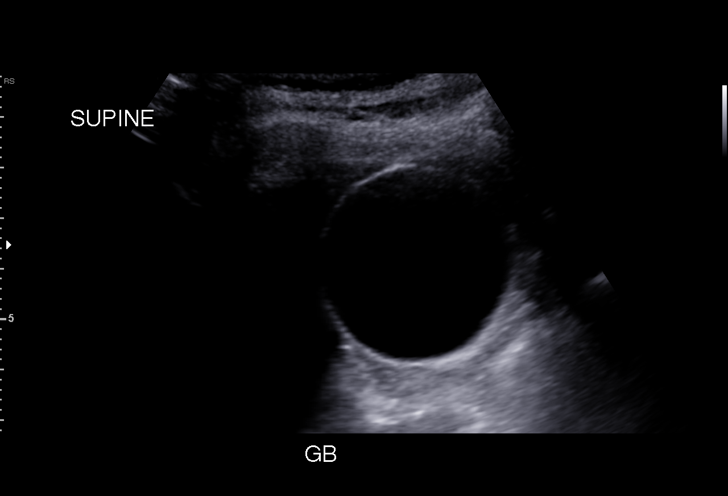
[im 50/64]
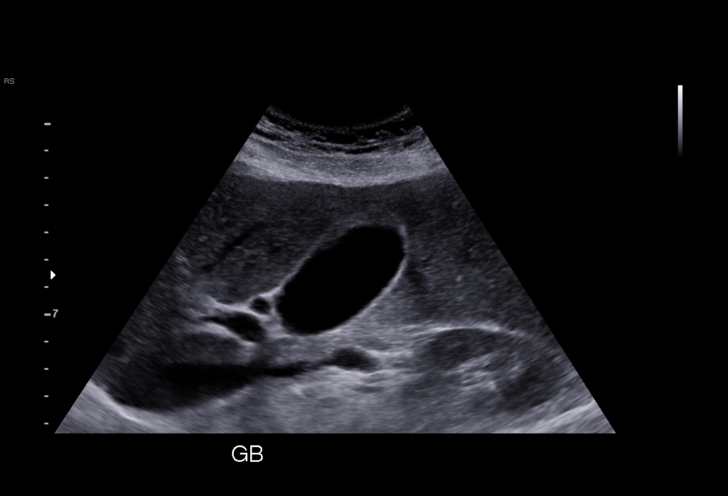
[im 53/64]
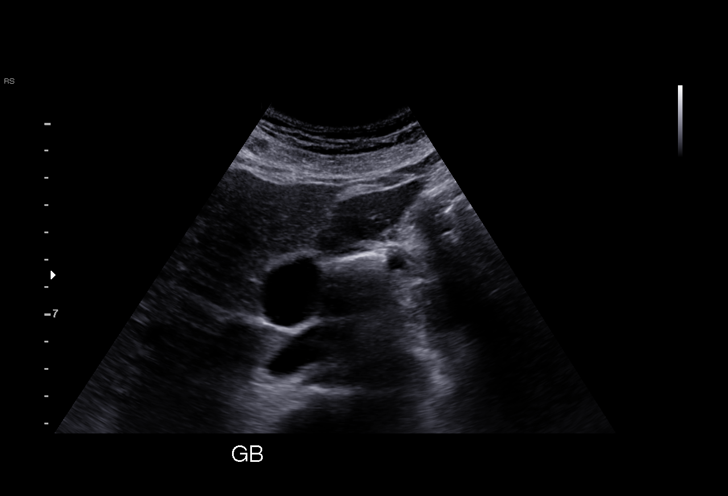
[im 58/64]
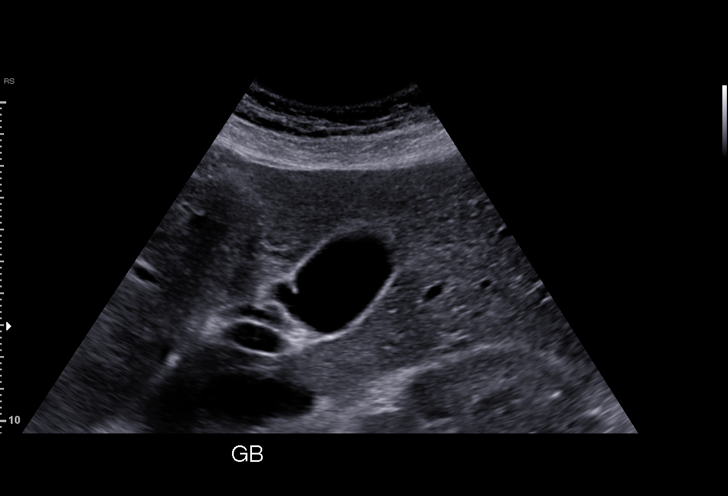
[im 64/64]
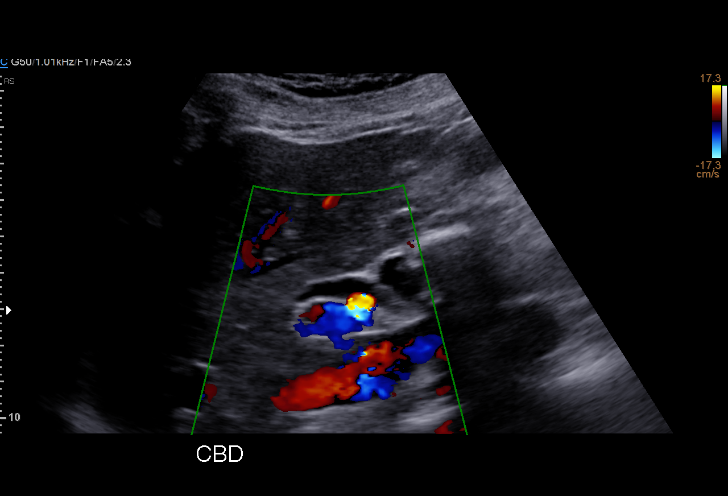

[15 of 25 positions shown; findings below may reference images not displayed]

FINDINGS: Gallbladder:

No gallstones or wall thickening visualized. No sonographic Murphy
sign noted by sonographer.

Common bile duct:

Diameter: 5 millimeters, within normal limits.

Liver:

No intrahepatic biliary ductal dilatation is evident. Liver
echogenicity is within normal limits. No discrete liver lesion.
Portal vein is patent on color Doppler imaging with normal direction
of blood flow towards the liver.

Other findings: Negative visible right kidney.
IMPRESSION: Normal gallbladder. Right upper quadrant ultrasound is within normal
limits.

## 2020-02-29 ENCOUNTER — Other Ambulatory Visit: Payer: BC Managed Care – PPO

## 2020-02-29 DIAGNOSIS — Z20822 Contact with and (suspected) exposure to covid-19: Secondary | ICD-10-CM

## 2020-03-02 LAB — NOVEL CORONAVIRUS, NAA: SARS-CoV-2, NAA: NOT DETECTED

## 2020-03-02 LAB — SARS-COV-2, NAA 2 DAY TAT

## 2020-03-22 DIAGNOSIS — F411 Generalized anxiety disorder: Secondary | ICD-10-CM | POA: Diagnosis not present

## 2020-03-22 DIAGNOSIS — F410 Panic disorder [episodic paroxysmal anxiety] without agoraphobia: Secondary | ICD-10-CM | POA: Diagnosis not present

## 2020-04-08 DIAGNOSIS — F410 Panic disorder [episodic paroxysmal anxiety] without agoraphobia: Secondary | ICD-10-CM | POA: Diagnosis not present

## 2020-04-08 DIAGNOSIS — F411 Generalized anxiety disorder: Secondary | ICD-10-CM | POA: Diagnosis not present

## 2020-04-23 DIAGNOSIS — T7840XA Allergy, unspecified, initial encounter: Secondary | ICD-10-CM | POA: Diagnosis not present

## 2020-04-23 DIAGNOSIS — S30861A Insect bite (nonvenomous) of abdominal wall, initial encounter: Secondary | ICD-10-CM | POA: Diagnosis not present

## 2020-05-23 IMAGING — US US ABDOMEN COMPLETE
1 series · 15 of 25 positions shown · non-contrast
Comparison: Ultrasound 04/21/2017, 08/01/2013.  CT 02/13/2015.

CLINICAL DATA: Right upper quadrant pain. Nausea. Anorexia.

EXAM:
ABDOMEN ULTRASOUND COMPLETE

[Series 1: us abdomen complete · 15 of 136 slices shown]
[im 1/136]
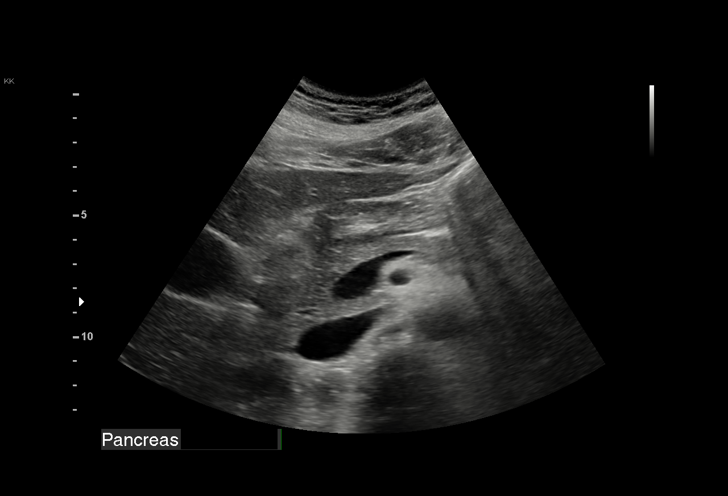
[im 12/136]
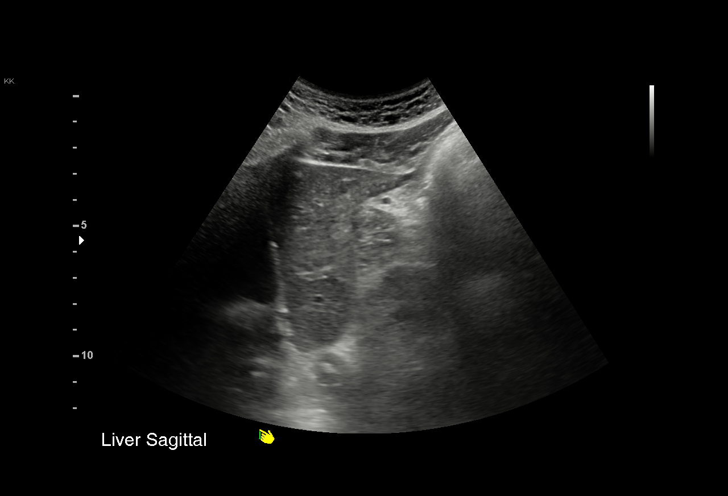
[im 23/136]
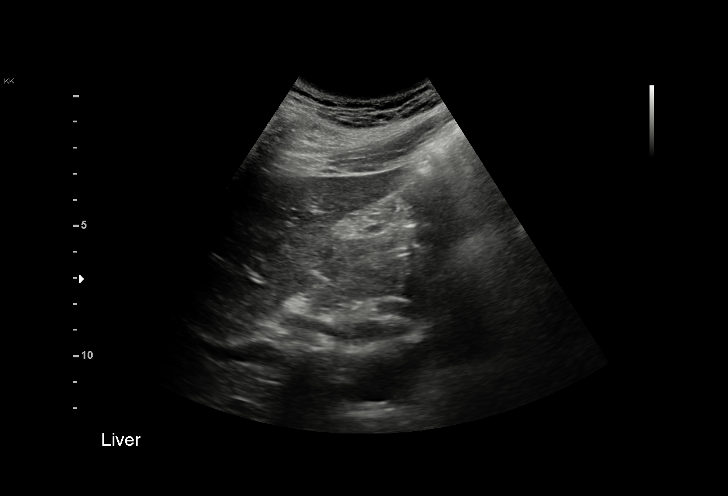
[im 29/136]
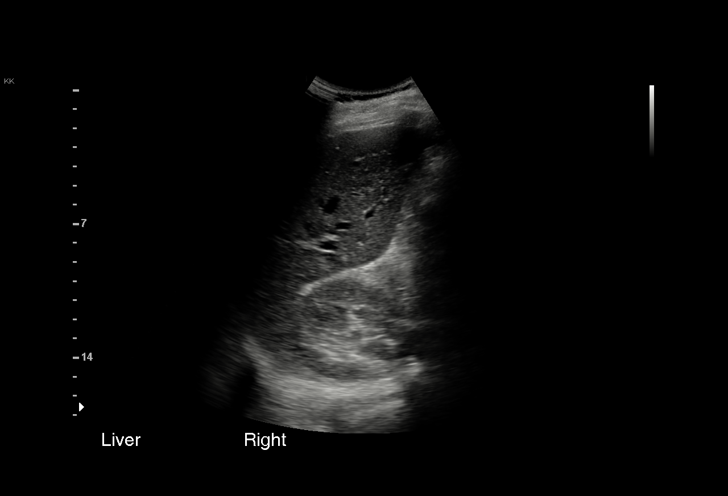
[im 40/136]
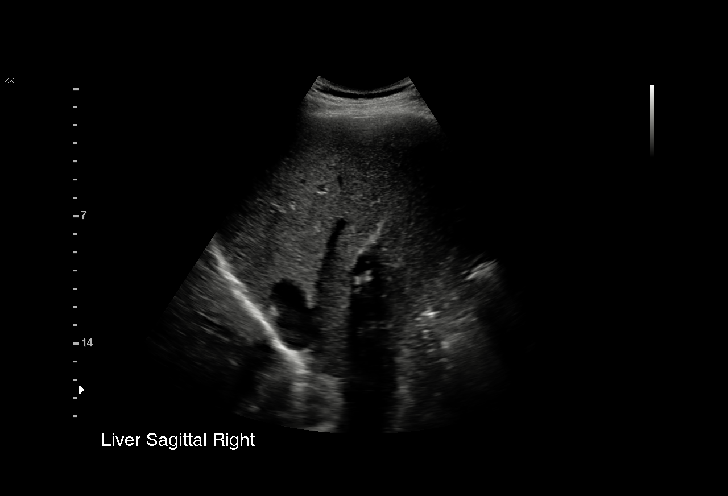
[im 51/136]
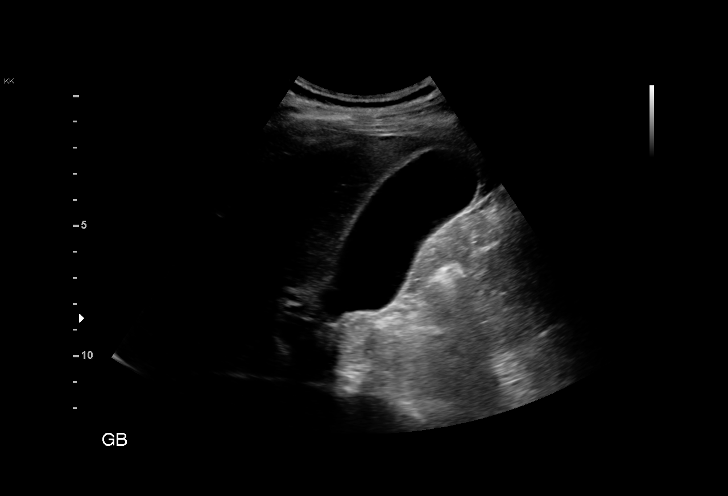
[im 57/136]
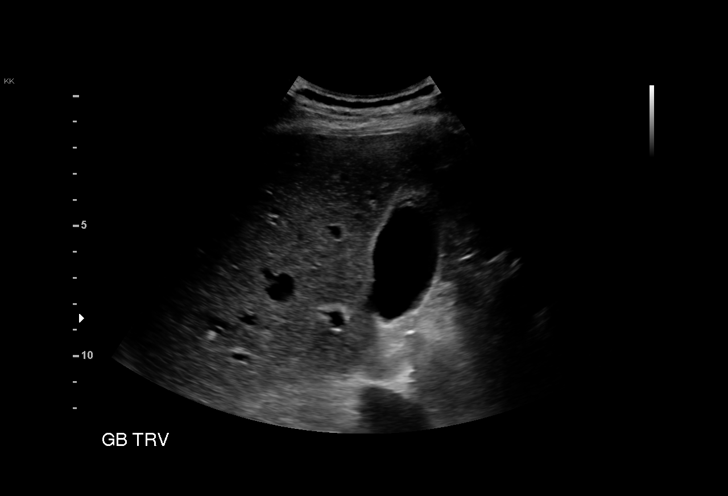
[im 68/136]
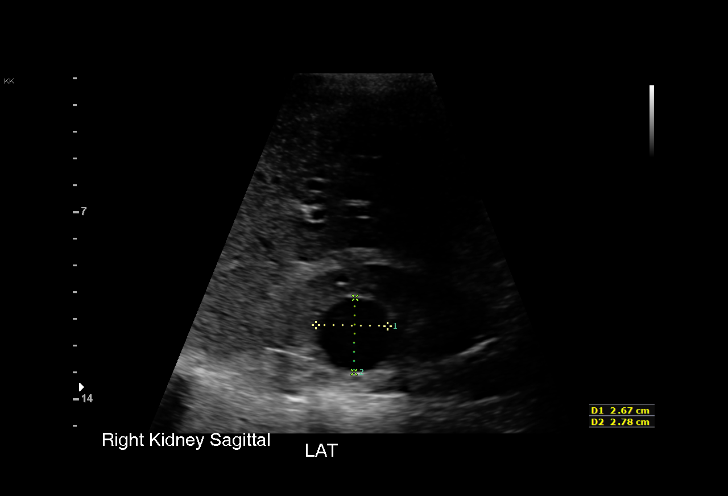
[im 79/136]
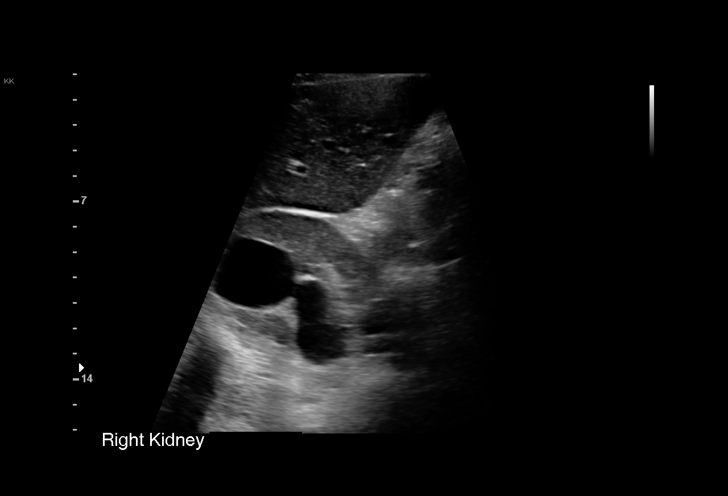
[im 85/136]
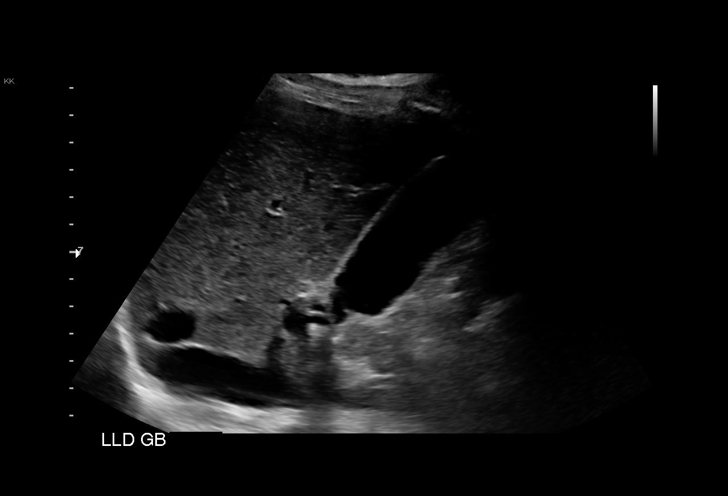
[im 96/136]
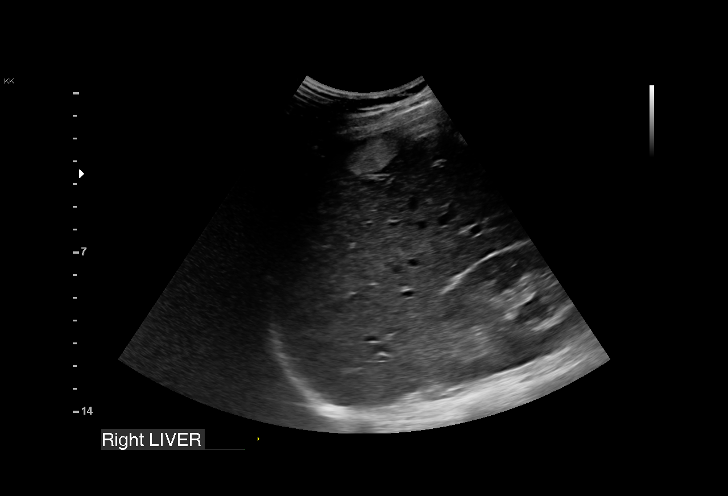
[im 107/136]
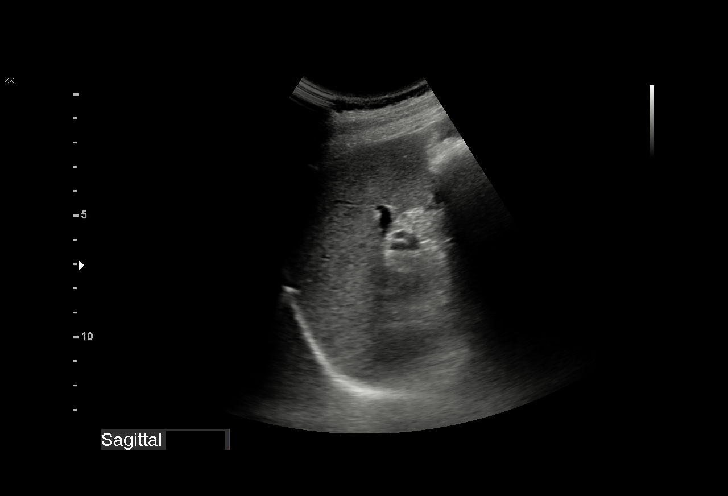
[im 113/136]
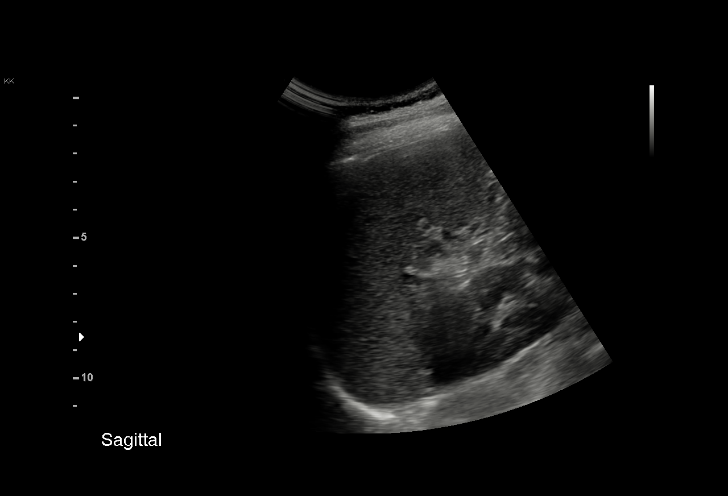
[im 124/136]
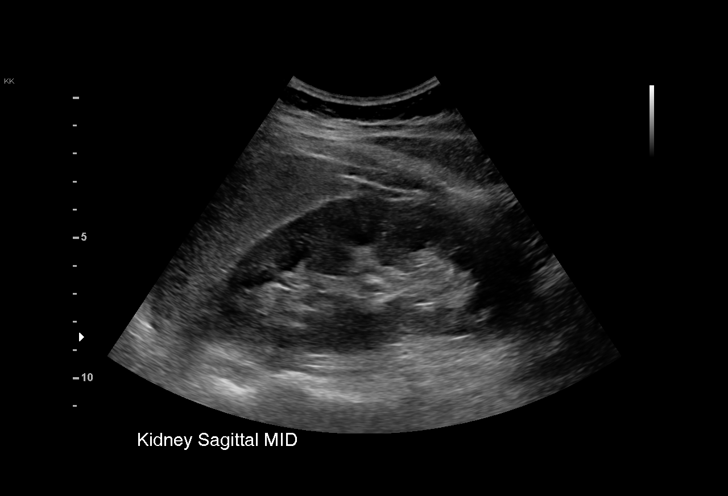
[im 136/136]
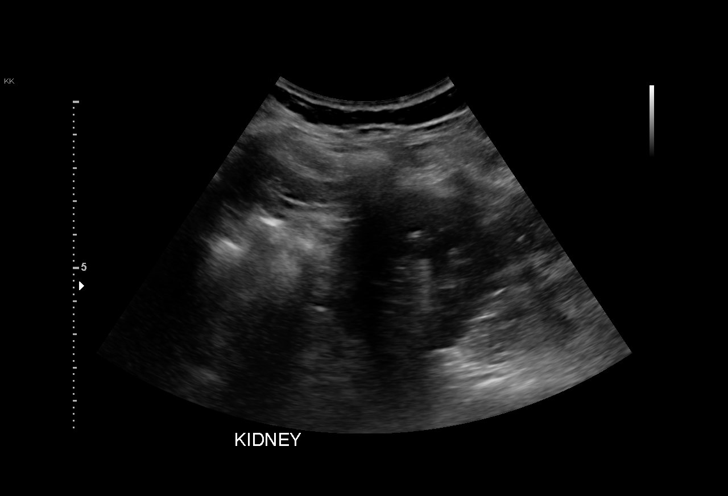

[15 of 25 positions shown; findings below may reference images not displayed]

FINDINGS: Gallbladder: No gallstones or wall thickening visualized. No
sonographic Murphy sign noted by sonographer.

Common bile duct: Diameter: 4.2 mm

Liver: Increased echogenicity consistent fatty infiltration and/or
hepatocellular disease. 2.7 cm hypoechoic nodule right hepatic lobe.
Similar finding noted on prior ultrasound of 08/01/2013. This is
most consistent with a benign hemangioma. Portal vein is patent on
color Doppler imaging with normal direction of blood flow towards
the liver.

IVC: No abnormality visualized.

Pancreas: Visualized portion unremarkable.

Spleen: Size and appearance within normal limits. Accessory spleen
again noted.

Right Kidney: Length: 11.9 cm. Normal echogenicity. Mild
hydronephrosis. 2.8 cm simple cyst.

Left Kidney: Length: 11.9 cm. Normal echogenicity. Small echogenic
foci noted possibly representing small calyceal stones. No mass or
hydronephrosis visualized.

Abdominal aorta: No aneurysm visualized.

Other findings: None.
IMPRESSION: 1.  Mild right hydronephrosis.  Right renal simple cyst.

2. Small echogenic foci noted in the left kidney, possibly small
nonobstructing calyceal stones.

3. Increased hepatic echogenicity consistent with fatty infiltration
and/or hepatocellular disease. 2.7 cm hyperechoic nodule is again in
the right hepatic lobe. Similar finding noted on prior ultrasound of
08/01/2013. This is most likely a benign hemangioma.

4.  Accessory spleen again noted.

## 2020-06-21 DIAGNOSIS — F410 Panic disorder [episodic paroxysmal anxiety] without agoraphobia: Secondary | ICD-10-CM | POA: Diagnosis not present

## 2020-06-21 DIAGNOSIS — F411 Generalized anxiety disorder: Secondary | ICD-10-CM | POA: Diagnosis not present

## 2020-07-03 DIAGNOSIS — F411 Generalized anxiety disorder: Secondary | ICD-10-CM | POA: Diagnosis not present

## 2020-07-03 DIAGNOSIS — F410 Panic disorder [episodic paroxysmal anxiety] without agoraphobia: Secondary | ICD-10-CM | POA: Diagnosis not present

## 2020-10-16 DIAGNOSIS — Z01419 Encounter for gynecological examination (general) (routine) without abnormal findings: Secondary | ICD-10-CM | POA: Diagnosis not present

## 2020-10-16 DIAGNOSIS — Z6827 Body mass index (BMI) 27.0-27.9, adult: Secondary | ICD-10-CM | POA: Diagnosis not present

## 2020-10-16 DIAGNOSIS — Z1231 Encounter for screening mammogram for malignant neoplasm of breast: Secondary | ICD-10-CM | POA: Diagnosis not present

## 2020-11-27 ENCOUNTER — Other Ambulatory Visit: Payer: Self-pay

## 2020-11-27 ENCOUNTER — Ambulatory Visit (INDEPENDENT_AMBULATORY_CARE_PROVIDER_SITE_OTHER): Payer: BC Managed Care – PPO | Admitting: Otolaryngology

## 2020-11-27 DIAGNOSIS — H6121 Impacted cerumen, right ear: Secondary | ICD-10-CM | POA: Diagnosis not present

## 2020-11-27 DIAGNOSIS — H903 Sensorineural hearing loss, bilateral: Secondary | ICD-10-CM | POA: Diagnosis not present

## 2020-11-27 DIAGNOSIS — H9071 Mixed conductive and sensorineural hearing loss, unilateral, right ear, with unrestricted hearing on the contralateral side: Secondary | ICD-10-CM

## 2020-11-27 DIAGNOSIS — H906 Mixed conductive and sensorineural hearing loss, bilateral: Secondary | ICD-10-CM | POA: Diagnosis not present

## 2020-11-27 DIAGNOSIS — H9313 Tinnitus, bilateral: Secondary | ICD-10-CM | POA: Diagnosis not present

## 2020-11-27 NOTE — Progress Notes (Signed)
HPI: Ellen Vasquez is a 45 y.o. female who returns today for evaluation of an abnormal sound that she is hearing in her left ear that sounds like the [place] code.  This comes and goes and she is not sure what brings it on.  She is not having any sounds presently.  She has not noted any significant change in her hearing although she does not hear as well in the right ear she does have a left.  She has had a long history of eustachian tube problems and trouble equalizing pressure in her ears..  Past Medical History:  Diagnosis Date   Anxiety    zoloft 150 mg    Chiari malformation type I ([facility])    MRI [facility] 2013   Depression    PPD with 2nd and 3rd baby (zoloft)   Endometriosis 1997   Headache    no meds    Headache in pregnancy    Hearing loss    Kidney infection    Kidney stones    current pregnancy    Otosclerosis 2008   Otosclerosis    Past Surgical History:  Procedure Laterality Date   DILATION AND CURETTAGE OF UTERUS  May 2015   LAPAROSCOPIC ABDOMINAL EXPLORATION  06/2013   has had 4 surgeries   SHOULDER SURGERY  2000   TONSILLECTOMY AND ADENOIDECTOMY  2010   TUBAL LIGATION N/A 10/05/2017   Procedure: POST PARTUM TUBAL LIGATION;  Surgeon: Everlene Farrier, MD;  Location: [place];  Service: Gynecology;  Laterality: N/A;   WISDOM TOOTH EXTRACTION  2008   Social History   Socioeconomic History   Marital status: Married    Spouse name: Not on file   Number of children: 2   Years of education: Phd ED   Highest education level: Not on file  Occupational History   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   Social Determinants of Health   Financial Resource Strain: Not on file  Food Insecurity: Not on file  Transportation Needs: Not on file  Physical Activity: Not on file  Stress: Not on file  Social Connections: Not on file   Family History  Problem Relation Age of Onset   Asthma Father    Arthritis Father        Rheumatoid   Cancer Maternal Grandmother        skin   Hypertension Other    Hyperlipidemia Other    Stroke Other    Obesity Other    Aneurysm Paternal Grandfather        brain   Skin cancer Paternal Grandmother    Migraines Neg Hx    Allergies  Allergen Reactions   Shellfish Allergy Hives and Swelling    Does fine with iodine   Verapamil     GI sx, weakness   Other     Surgical glue causes a rash  Sulfa Antibiotics Dermatitis and Rash   Tape Dermatitis    With derma bond   Prior to Admission medications   Medication Sig Start Date End Date Taking? Authorizing Provider  budesonide (ENTOCORT EC) 3 MG 24 hr capsule Take 3 capsules by mouth every morning. 09/21/17   [provider]  ibuprofen (ADVIL,MOTRIN) 600 MG tablet Take 1 tablet (600 mg total) by mouth every 6 (six) hours. 10/06/17   Molli Posey, MD  nystatin (MYCOSTATIN) 100000 UNIT/ML suspension Take 5 mLs by mouth 4 (four) times daily. 09/24/17   [provider]  ondansetron (ZOFRAN-ODT) 4 MG disintegrating tablet Take 4 mg by mouth every 8 (eight) hours as needed for nausea or vomiting.    [provider]  oxyCODONE-acetaminophen (PERCOCET/ROXICET) 5-325 MG tablet Take 1-2 tablets by mouth every 6 (six) hours as needed for severe pain. 10/06/17   Molli Posey, MD  PRENATAL 27-1 MG TABS Take 1 tablet by mouth daily.    [provider]  promethazine (PHENERGAN) 25 MG tablet Take 25 mg by mouth every 6 (six) hours as needed for nausea  or vomiting.    [provider]  sertraline (ZOLOFT) 100 MG tablet Take by mouth.    [provider]  vancomycin (VANCOCIN) 125 MG capsule Take 4 times a day for 4 weeks (starting 7/18) Then twice a day for 1 week. Then once a day for 1 week. Then every other day for 2 weeks. 09/14/17   [provider]     Positive ROS: Otherwise negative  All other systems have been reviewed and were otherwise negative with the exception of those mentioned in the HPI and as above.  Physical Exam: Constitutional: Alert, well-appearing, no acute distress Ears: External ears without lesions or tenderness.  Right ear canal with some wax and debris in the attic area that was removed.  She has some scabbing and crusting up in the attic area consistent with possible early right attic cholesteatoma.  The TM is retracted but clear otherwise with no middle ear effusion noted.  On the left side again the TM is retracted but no middle ear effusion noted and no wax buildup on the left side.  On tuning fork testing AC was greater than BC bilaterally. Nasal: External nose without lesions. Septum with minimal deformity and mild rhinitis.. Clear nasal passages otherwise. Oral: Lips and gums without lesions. Tongue and palate mucosa without lesions. Posterior oropharynx clear. Neck: No palpable adenopathy or masses Respiratory: Breathing comfortably  Skin: No facial/neck lesions or rash noted.  Cerumen impaction removal  Date/Time: 11/27/2020 11:45 AM Performed by: Rozetta Nunnery, MD Authorized by: Rozetta Nunnery, MD   Consent:    Consent obtained:  Verbal   Consent given by:  Patient   Risks discussed:  Pain and bleeding Procedure details:    Location:  R ear   Procedure type: curette and suction   Post-procedure details:    Inspection:  TM intact and canal normal   Hearing quality:  Improved   Procedure completion:  Tolerated well, no immediate complications Comments:      Patient has some wax and skin that was removed from the superior aspect of the right TM and agitate the area.  I note she has some crusting and what appears to be an early development of possible attic cholesteatoma.  Audiologic testing in the office today demonstrated minimal change in hearing compared to her hearing test performed a year ago perhaps slightly more decreased hearing in  the right compared to previous audiologic testing.  She had type C tympanograms bilaterally.  She had a low-frequency sensorineural hearing loss in both ears that was relatively symmetric with a mild conductive component on the right side.  Assessment: Possible early attic cholesteatoma developing in the right ear.  Minimal change on hearing test over the past year. The abnormal sound in the left ear is questionable etiology.  Plan: Ear canals were cleaned in the office today.  She has chronic eustachian tube dysfunction with retracted TMs bilaterally and possibly an early attic cholesteatoma on the right side. Recommended use of Nasacort 2 sprays each nostril at night and called in prescription for Nasacort to [facility]. Suggested follow-up with ENT in 1 year or earlier if she notices any decline in her hearing.   Radene Journey, MD

## 2020-12-03 ENCOUNTER — Encounter (INDEPENDENT_AMBULATORY_CARE_PROVIDER_SITE_OTHER): Payer: Self-pay

## 2021-02-13 DIAGNOSIS — H906 Mixed conductive and sensorineural hearing loss, bilateral: Secondary | ICD-10-CM | POA: Diagnosis not present

## 2021-05-01 DIAGNOSIS — J0100 Acute maxillary sinusitis, unspecified: Secondary | ICD-10-CM | POA: Diagnosis not present

## 2021-05-01 DIAGNOSIS — H10021 Other mucopurulent conjunctivitis, right eye: Secondary | ICD-10-CM | POA: Diagnosis not present

## 2021-05-06 DIAGNOSIS — R159 Full incontinence of feces: Secondary | ICD-10-CM | POA: Diagnosis not present

## 2021-05-06 DIAGNOSIS — K581 Irritable bowel syndrome with constipation: Secondary | ICD-10-CM | POA: Diagnosis not present

## 2021-05-06 DIAGNOSIS — K224 Dyskinesia of esophagus: Secondary | ICD-10-CM | POA: Diagnosis not present

## 2021-05-06 DIAGNOSIS — K219 Gastro-esophageal reflux disease without esophagitis: Secondary | ICD-10-CM | POA: Diagnosis not present

## 2021-07-17 DIAGNOSIS — R1011 Right upper quadrant pain: Secondary | ICD-10-CM | POA: Diagnosis not present

## 2021-07-17 DIAGNOSIS — K581 Irritable bowel syndrome with constipation: Secondary | ICD-10-CM | POA: Diagnosis not present

## 2021-07-17 DIAGNOSIS — R194 Change in bowel habit: Secondary | ICD-10-CM | POA: Diagnosis not present

## 2021-08-04 DIAGNOSIS — N281 Cyst of kidney, acquired: Secondary | ICD-10-CM | POA: Diagnosis not present

## 2021-08-04 DIAGNOSIS — D1803 Hemangioma of intra-abdominal structures: Secondary | ICD-10-CM | POA: Diagnosis not present

## 2021-09-23 DIAGNOSIS — R194 Change in bowel habit: Secondary | ICD-10-CM | POA: Diagnosis not present

## 2021-10-13 DIAGNOSIS — F4321 Adjustment disorder with depressed mood: Secondary | ICD-10-CM | POA: Diagnosis not present

## 2021-10-13 DIAGNOSIS — F411 Generalized anxiety disorder: Secondary | ICD-10-CM | POA: Diagnosis not present

## 2021-10-13 DIAGNOSIS — F410 Panic disorder [episodic paroxysmal anxiety] without agoraphobia: Secondary | ICD-10-CM | POA: Diagnosis not present

## 2021-10-21 DIAGNOSIS — F410 Panic disorder [episodic paroxysmal anxiety] without agoraphobia: Secondary | ICD-10-CM | POA: Diagnosis not present

## 2021-10-21 DIAGNOSIS — F4321 Adjustment disorder with depressed mood: Secondary | ICD-10-CM | POA: Diagnosis not present

## 2021-10-21 DIAGNOSIS — F411 Generalized anxiety disorder: Secondary | ICD-10-CM | POA: Diagnosis not present

## 2021-11-06 DIAGNOSIS — F4321 Adjustment disorder with depressed mood: Secondary | ICD-10-CM | POA: Diagnosis not present

## 2021-11-06 DIAGNOSIS — F410 Panic disorder [episodic paroxysmal anxiety] without agoraphobia: Secondary | ICD-10-CM | POA: Diagnosis not present

## 2021-11-06 DIAGNOSIS — F411 Generalized anxiety disorder: Secondary | ICD-10-CM | POA: Diagnosis not present

## 2021-11-27 DIAGNOSIS — F4321 Adjustment disorder with depressed mood: Secondary | ICD-10-CM | POA: Diagnosis not present

## 2021-11-27 DIAGNOSIS — F411 Generalized anxiety disorder: Secondary | ICD-10-CM | POA: Diagnosis not present

## 2021-11-27 DIAGNOSIS — F410 Panic disorder [episodic paroxysmal anxiety] without agoraphobia: Secondary | ICD-10-CM | POA: Diagnosis not present

## 2021-12-04 DIAGNOSIS — Z6827 Body mass index (BMI) 27.0-27.9, adult: Secondary | ICD-10-CM | POA: Diagnosis not present

## 2021-12-04 DIAGNOSIS — Z131 Encounter for screening for diabetes mellitus: Secondary | ICD-10-CM | POA: Diagnosis not present

## 2021-12-04 DIAGNOSIS — Z01419 Encounter for gynecological examination (general) (routine) without abnormal findings: Secondary | ICD-10-CM | POA: Diagnosis not present

## 2021-12-04 DIAGNOSIS — Z130 Encounter for screening for diseases of the blood and blood-forming organs and certain disorders involving the immune mechanism: Secondary | ICD-10-CM | POA: Diagnosis not present

## 2021-12-04 DIAGNOSIS — Z1231 Encounter for screening mammogram for malignant neoplasm of breast: Secondary | ICD-10-CM | POA: Diagnosis not present

## 2021-12-04 DIAGNOSIS — Z13220 Encounter for screening for lipoid disorders: Secondary | ICD-10-CM | POA: Diagnosis not present

## 2021-12-04 DIAGNOSIS — Z1321 Encounter for screening for nutritional disorder: Secondary | ICD-10-CM | POA: Diagnosis not present

## 2021-12-04 DIAGNOSIS — Z1329 Encounter for screening for other suspected endocrine disorder: Secondary | ICD-10-CM | POA: Diagnosis not present

## 2021-12-10 DIAGNOSIS — F410 Panic disorder [episodic paroxysmal anxiety] without agoraphobia: Secondary | ICD-10-CM | POA: Diagnosis not present

## 2021-12-10 DIAGNOSIS — F4321 Adjustment disorder with depressed mood: Secondary | ICD-10-CM | POA: Diagnosis not present

## 2021-12-10 DIAGNOSIS — F411 Generalized anxiety disorder: Secondary | ICD-10-CM | POA: Diagnosis not present

## 2021-12-29 DIAGNOSIS — N924 Excessive bleeding in the premenopausal period: Secondary | ICD-10-CM | POA: Diagnosis not present

## 2021-12-30 DIAGNOSIS — F411 Generalized anxiety disorder: Secondary | ICD-10-CM | POA: Diagnosis not present

## 2021-12-30 DIAGNOSIS — F410 Panic disorder [episodic paroxysmal anxiety] without agoraphobia: Secondary | ICD-10-CM | POA: Diagnosis not present

## 2021-12-30 DIAGNOSIS — F4321 Adjustment disorder with depressed mood: Secondary | ICD-10-CM | POA: Diagnosis not present

## 2022-01-05 ENCOUNTER — Other Ambulatory Visit: Payer: Self-pay | Admitting: Obstetrics and Gynecology

## 2022-01-05 DIAGNOSIS — N8301 Follicular cyst of right ovary: Secondary | ICD-10-CM | POA: Diagnosis not present

## 2022-01-05 DIAGNOSIS — N8302 Follicular cyst of left ovary: Secondary | ICD-10-CM | POA: Diagnosis not present

## 2022-01-05 DIAGNOSIS — N8000 Endometriosis of the uterus, unspecified: Secondary | ICD-10-CM | POA: Diagnosis not present

## 2022-01-05 DIAGNOSIS — D259 Leiomyoma of uterus, unspecified: Secondary | ICD-10-CM | POA: Diagnosis not present

## 2022-01-05 DIAGNOSIS — N803 Endometriosis of pelvic peritoneum, unspecified: Secondary | ICD-10-CM | POA: Diagnosis not present

## 2022-01-05 DIAGNOSIS — N8312 Corpus luteum cyst of left ovary: Secondary | ICD-10-CM | POA: Diagnosis not present

## 2022-01-05 DIAGNOSIS — N259 Disorder resulting from impaired renal tubular function, unspecified: Secondary | ICD-10-CM | POA: Diagnosis not present

## 2022-01-05 DIAGNOSIS — D261 Other benign neoplasm of corpus uteri: Secondary | ICD-10-CM | POA: Diagnosis not present

## 2022-01-07 ENCOUNTER — Emergency Department (HOSPITAL_COMMUNITY)
Admission: EM | Admit: 2022-01-07 | Discharge: 2022-01-07 | Disposition: A | Discharge status: Home / Self Care | Payer: BC Managed Care – PPO | Attending: Emergency Medicine | Admitting: Emergency Medicine

## 2022-01-07 ENCOUNTER — Encounter (HOSPITAL_COMMUNITY): Payer: Self-pay | Admitting: Radiology

## 2022-01-07 ENCOUNTER — Other Ambulatory Visit: Payer: Self-pay

## 2022-01-07 ENCOUNTER — Emergency Department (HOSPITAL_COMMUNITY): Payer: BC Managed Care – PPO

## 2022-01-07 DIAGNOSIS — G8918 Other acute postprocedural pain: Secondary | ICD-10-CM | POA: Insufficient documentation

## 2022-01-07 DIAGNOSIS — R110 Nausea: Secondary | ICD-10-CM | POA: Insufficient documentation

## 2022-01-07 DIAGNOSIS — D509 Iron deficiency anemia, unspecified: Secondary | ICD-10-CM | POA: Diagnosis not present

## 2022-01-07 DIAGNOSIS — R109 Unspecified abdominal pain: Secondary | ICD-10-CM | POA: Diagnosis not present

## 2022-01-07 DIAGNOSIS — N281 Cyst of kidney, acquired: Secondary | ICD-10-CM | POA: Diagnosis not present

## 2022-01-07 DIAGNOSIS — R140 Abdominal distension (gaseous): Secondary | ICD-10-CM | POA: Diagnosis not present

## 2022-01-07 DIAGNOSIS — K769 Liver disease, unspecified: Secondary | ICD-10-CM | POA: Diagnosis not present

## 2022-01-07 LAB — URINALYSIS, ROUTINE W REFLEX MICROSCOPIC
Bacteria, UA: NONE SEEN
Bilirubin Urine: NEGATIVE
Glucose, UA: NEGATIVE mg/dL
Ketones, ur: NEGATIVE mg/dL
Leukocytes,Ua: NEGATIVE
Nitrite: NEGATIVE
Protein, ur: NEGATIVE mg/dL
Specific Gravity, Urine: 1.005 (ref 1.005–1.030)
pH: 6 (ref 5.0–8.0)

## 2022-01-07 LAB — CBC WITH DIFFERENTIAL/PLATELET
Abs Immature Granulocytes: 0.04 10*3/uL (ref 0.00–0.07)
Basophils Absolute: 0.1 10*3/uL (ref 0.0–0.1)
Basophils Relative: 1 %
Eosinophils Absolute: 0.4 10*3/uL (ref 0.0–0.5)
Eosinophils Relative: 4 %
HCT: 40.4 % (ref 36.0–46.0)
Hemoglobin: 12.9 g/dL (ref 12.0–15.0)
Immature Granulocytes: 0 %
Lymphocytes Relative: 27 %
Lymphs Abs: 2.5 10*3/uL (ref 0.7–4.0)
MCH: 29.8 pg (ref 26.0–34.0)
MCHC: 31.9 g/dL (ref 30.0–36.0)
MCV: 93.3 fL (ref 80.0–100.0)
Monocytes Absolute: 0.5 10*3/uL (ref 0.1–1.0)
Monocytes Relative: 6 %
Neutro Abs: 6.1 10*3/uL (ref 1.7–7.7)
Neutrophils Relative %: 62 %
Platelets: 207 10*3/uL (ref 150–400)
RBC: 4.33 MIL/uL (ref 3.87–5.11)
RDW: 12.5 % (ref 11.5–15.5)
WBC: 9.5 10*3/uL (ref 4.0–10.5)
nRBC: 0 % (ref 0.0–0.2)

## 2022-01-07 LAB — COMPREHENSIVE METABOLIC PANEL
ALT: 12 U/L (ref 0–44)
AST: 17 U/L (ref 15–41)
Albumin: 3.8 g/dL (ref 3.5–5.0)
Alkaline Phosphatase: 57 U/L (ref 38–126)
Anion gap: 4 — ABNORMAL LOW (ref 5–15)
BUN: 8 mg/dL (ref 6–20)
CO2: 27 mmol/L (ref 22–32)
Calcium: 8.6 mg/dL — ABNORMAL LOW (ref 8.9–10.3)
Chloride: 111 mmol/L (ref 98–111)
Creatinine, Ser: 0.81 mg/dL (ref 0.44–1.00)
GFR, Estimated: >60 mL/min (ref >60)
Glucose, Bld: 78 mg/dL (ref 70–99)
Potassium: 3.7 mmol/L (ref 3.5–5.1)
Sodium: 142 mmol/L (ref 135–145)
Total Bilirubin: 0.5 mg/dL (ref 0.3–1.2)
Total Protein: 6.5 g/dL (ref 6.5–8.1)

## 2022-01-07 LAB — LIPASE, BLOOD: Lipase: 32 U/L (ref 11–51)

## 2022-01-07 LAB — MAGNESIUM: Magnesium: 2.2 mg/dL (ref 1.7–2.4)

## 2022-01-07 LAB — LACTIC ACID, PLASMA: Lactic Acid, Venous: 1 mmol/L (ref 0.5–1.9)

## 2022-01-07 MED ORDER — IBUPROFEN 200 MG PO TABS
600.0000 mg | ORAL_TABLET | Freq: Once | ORAL | Status: AC
  Administered 2022-01-07: 600 mg via ORAL
  Filled 2022-01-07: qty 3

## 2022-01-07 MED ORDER — IOHEXOL 300 MG/ML  SOLN
100.0000 mL | Freq: Once | INTRAMUSCULAR | Status: AC | PRN
  Administered 2022-01-07: 100 mL via INTRAVENOUS

## 2022-01-07 MED ORDER — MAGNESIUM CITRATE PO SOLN: 1.0000 | Freq: Once | ORAL | 0 refills | Status: AC

## 2022-01-07 NOTE — ED Triage Notes (Signed)
C/o abd distention x 1 day.  Hysterectomy on Monday  Sent by surgery for abnormal bowel sounds.  No BM since before surgery. Pt is passing gas.  Muscle relaxer @ 0715 and oxycodone '@0500'$ 

## 2022-01-07 NOTE — Discharge Instructions (Signed)
A prescription for magnesium citrate was sent to your pharmacy.  This will help move your bowels.  Follow-up with Dr. Gaetano Net as scheduled.  Return to the emergency department for any new or worsening symptoms of concern.

## 2022-01-07 NOTE — Progress Notes (Signed)
[facility] DEPT Provider Note   CSN: [id number] Arrival date & time: 01/07/22  9476     History 46 yo S/P uncomplicated LAVH/BSO on 54/65/03 at [facility]. She was discharged the same evening in stable condition. I called her yesterday afternoon to check on her condition. She was sleeping at that time and returned my call later in the afternoon. At that time she stated her abdomen looked "6 months pregnant". She was tolerating food and liquids, voiding, ambulating without difficulty and denied chills/fever. We discussed options and scheduled her for FU today in office. At that time she remained unchanged. Her abdomen was mildly distended, hypoactive BS and no rebound tenderness. She was [facility] ED for further evaluation. Last BM is sometime preop. Chief Complaint  Patient presents with   Post-op Problem    Ellen Vasquez is a 46 y.o. female.  HPI     Home Medications Prior to Admission medications   Medication Sig Start Date End Date Taking? Authorizing Provider  budesonide (ENTOCORT EC) 3 MG 24 hr capsule Take 3 capsules by mouth every morning. 09/21/17   [provider]  ibuprofen (ADVIL,MOTRIN) 600 MG tablet Take 1 tablet (600 mg total) by mouth every 6 (six) hours. 10/06/17   Molli Posey, MD  nystatin (MYCOSTATIN) 100000 UNIT/ML suspension Take 5 mLs by mouth 4 (four) times daily. 09/24/17   [provider]  ondansetron (ZOFRAN-ODT) 4 MG disintegrating tablet Take 4 mg by mouth every 8 (eight) hours as needed for nausea or vomiting.    [provider]  oxyCODONE-acetaminophen (PERCOCET/ROXICET) 5-325 MG tablet Take 1-2 tablets by mouth every 6 (six) hours as needed for severe pain. 10/06/17   Molli Posey, MD  PRENATAL 27-1 MG TABS Take 1 tablet by mouth daily.    [provider]  promethazine (PHENERGAN) 25 MG tablet Take 25 mg by mouth every 6 (six) hours as needed for nausea or  vomiting.    [provider]  sertraline (ZOLOFT) 100 MG tablet Take by mouth.    [provider]  vancomycin (VANCOCIN) 125 MG capsule Take 4 times a day for 4 weeks (starting 7/18) Then twice a day for 1 week. Then once a day for 1 week. Then every other day for 2 weeks. 09/14/17   [provider]      Allergies    Shellfish allergy, Other, Sulfa antibiotics, Verapamil, and Tape    Review of Systems   Review of Systems  Physical Exam Updated Vital Signs BP 118/71   Pulse 60   Temp (!) 97.5 F (36.4 C) (Oral)   Resp 18   Ht '5\' 9"'$  (1.753 m)   Wt 84 kg   LMP 09/05/2016 (Exact Date)   SpO2 99%   BMI 27.35 kg/m  Physical Exam NAD Lungs CTA Cor RRR Abdomen: soft, mild distension, BS+, no rebound tenderness LE: NT   ED Results / Procedures / Treatments   Labs (all labs ordered are listed, but only abnormal results are displayed) Labs Reviewed  COMPREHENSIVE METABOLIC PANEL - Abnormal; Notable for the following components:      Result Value   Calcium 8.6 (*)    Anion gap 4 (*)    All other components within normal limits  URINALYSIS, ROUTINE W REFLEX MICROSCOPIC - Abnormal; Notable for the following components:   Color, Urine STRAW (*)    Hgb urine dipstick SMALL (*)    All other components within normal limits  CBC WITH DIFFERENTIAL/PLATELET  LIPASE, BLOOD  MAGNESIUM  LACTIC ACID, PLASMA    EKG    Radiology CT ABDOMEN PELVIS W CONTRAST  Result Date: 01/07/2022 CLINICAL DATA:  Abdominal pain and bloating since hysterectomy on Monday. EXAM: CT ABDOMEN AND PELVIS WITH CONTRAST TECHNIQUE: Multidetector CT imaging of the abdomen and pelvis was performed using the standard protocol following bolus administration of intravenous contrast. RADIATION DOSE REDUCTION: This exam was performed according to the departmental dose-optimization program which includes automated exposure control, adjustment of the mA and/or kV according to patient size and/or  use of iterative reconstruction technique. CONTRAST:  117m OMNIPAQUE IOHEXOL 300 MG/ML  SOLN COMPARISON:  CT February 13, 2015. FINDINGS: Lower chest: No acute abnormality. Hepatobiliary: Hypodense 2.3 cm lesion in the peripheral aspect of the right lobe of the liver on image 24/2 demonstrates peripheral nodular enhancement with filling in of the lesion on delayed imaging compatible with a benign hemangioma. Gallbladder is distended. No biliary ductal dilation. Pancreas: No pancreatic ductal dilation or evidence of acute inflammation. Spleen: No splenomegaly. Adrenals/Urinary Tract: Bilateral adrenal glands appear normal. No hydronephrosis. Benign 4.6 cm right renal cyst requires no imaging follow-up. Kidneys demonstrate symmetric enhancement and excretion of contrast material. Gas in the urinary bladder. Stomach/Bowel: Stomach is unremarkable for degree of distension. No pathologic dilation of small or large bowel. The appendix is not confidently identified however there is no evidence of acute appendicitis. Vascular/Lymphatic: Normal caliber abdominal aorta. No pathologically enlarged abdominal or pelvic lymph nodes. Reproductive: Uterus is surgically absent. Trace pelvic free fluid with loculated fluid versus a developing fluid collection in the recto uterine pouch measuring 5.9 x 3.1 cm on image 76/2. No suspicious adnexal mass. Other: Subcutaneous gas and stranding in the anterior abdominal wall and left rectus abdominus muscle is within normal limits in the early postsurgical setting. Musculoskeletal: No acute or significant osseous findings. IMPRESSION: 1. Postsurgical changes of recent hysterectomy with trace pelvic free fluid and a loculated fluid versus a developing fluid collection in the rectouterine pouch measuring 5.9 x 3.1 cm. 2. Gas in the urinary bladder, correlate for recent instrumentation. 3. Hypodense 2.3 cm lesion in the peripheral aspect of the right lobe of the liver demonstrates peripheral  nodular enhancement with filling in of the lesion on delayed imaging compatible with a benign hemangioma. Electronically Signed   By: JDahlia BailiffM.D.   On: 01/07/2022 12:46    Procedures Procedures  Results for orders placed or performed during the hospital encounter of 01/07/22 (from the past 24 hour(s))  CBC with Differential     Status: None   Collection Time: 01/07/22 10:58 AM  Result Value Ref Range   WBC 9.5 4.0 - 10.5 K/uL   RBC 4.33 3.87 - 5.11 MIL/uL   Hemoglobin 12.9 12.0 - 15.0 g/dL   HCT 40.4 36.0 - 46.0 %   MCV 93.3 80.0 - 100.0 fL   MCH 29.8 26.0 - 34.0 pg   MCHC 31.9 30.0 - 36.0 g/dL   RDW 12.5 11.5 - 15.5 %   Platelets 207 150 - 400 K/uL   nRBC 0.0 0.0 - 0.2 %   Neutrophils Relative % 62 %   Neutro Abs 6.1 1.7 - 7.7 K/uL   Lymphocytes Relative 27 %   Lymphs Abs 2.5 0.7 - 4.0 K/uL   Monocytes Relative 6 %   Monocytes Absolute 0.5 0.1 - 1.0 K/uL   Eosinophils Relative 4 %   Eosinophils Absolute 0.4 0.0 - 0.5 K/uL   Basophils  Relative 1 %   Basophils Absolute 0.1 0.0 - 0.1 K/uL   Immature Granulocytes 0 %   Abs Immature Granulocytes 0.04 0.00 - 0.07 K/uL  Comprehensive metabolic panel     Status: Abnormal   Collection Time: 01/07/22 10:58 AM  Result Value Ref Range   Sodium 142 135 - 145 mmol/L   Potassium 3.7 3.5 - 5.1 mmol/L   Chloride 111 98 - 111 mmol/L   CO2 27 22 - 32 mmol/L   Glucose, Bld 78 70 - 99 mg/dL   BUN 8 6 - 20 mg/dL   Creatinine, Ser 0.81 0.44 - 1.00 mg/dL   Calcium 8.6 (L) 8.9 - 10.3 mg/dL   Total Protein 6.5 6.5 - 8.1 g/dL   Albumin 3.8 3.5 - 5.0 g/dL   AST 17 15 - 41 U/L   ALT 12 0 - 44 U/L   Alkaline Phosphatase 57 38 - 126 U/L   Total Bilirubin 0.5 0.3 - 1.2 mg/dL   GFR, Estimated >60 >60 mL/min   Anion gap 4 (L) 5 - 15  Lipase, blood     Status: None   Collection Time: 01/07/22 10:58 AM  Result Value Ref Range   Lipase 32 11 - 51 U/L  Magnesium     Status: None   Collection Time: 01/07/22 10:58 AM  Result Value Ref Range    Magnesium 2.2 1.7 - 2.4 mg/dL  Lactic acid, plasma     Status: None   Collection Time: 01/07/22 10:58 AM  Result Value Ref Range   Lactic Acid, Venous 1.0 0.5 - 1.9 mmol/L  Urinalysis, Routine w reflex microscopic Urine, Clean Catch     Status: Abnormal   Collection Time: 01/07/22 11:03 AM  Result Value Ref Range   Color, Urine STRAW (A) YELLOW   APPearance CLEAR CLEAR   Specific Gravity, Urine 1.005 1.005 - 1.030   pH 6.0 5.0 - 8.0   Glucose, UA NEGATIVE NEGATIVE mg/dL   Hgb urine dipstick SMALL (A) NEGATIVE   Bilirubin Urine NEGATIVE NEGATIVE   Ketones, ur NEGATIVE NEGATIVE mg/dL   Protein, ur NEGATIVE NEGATIVE mg/dL   Nitrite NEGATIVE NEGATIVE   Leukocytes,Ua NEGATIVE NEGATIVE   RBC / HPF 0-5 0 - 5 RBC/hpf   Bacteria, UA NONE SEEN NONE SEEN   Squamous Epithelial / LPF 0-5 0 - 5   Mucus PRESENT      Medications Ordered in ED Medications  ibuprofen (ADVIL) tablet 600 mg (600 mg Oral Given 01/07/22 1052)  iohexol (OMNIPAQUE) 300 MG/ML solution 100 mL (100 mLs Intravenous Contrast Given 01/07/22 1203)    ED Course/ Medical Decision Making/ A&P                           Medical Decision Making  Patient is stable with no evidence of bowel/bladder/ureteral postoperative complication. Her laboratory studies are normal with no evidence of infection or sepsis. The abdominal/pelvic CT notes a small postoperative fluid collection in the pelvis. This does not have characteristics of abscess or hematoma. I carefully discussed all of this with patient and her husband. I also discussed the incidental findings of a liver hemangioma and renal cyst. She wants to go home. She is stable for discharge. She will continue to hydrate and consume calories. She will use a cathartic for a BM. Rest as needed. She has an office appointment in 5 days.         Final Clinical Impression(s) / ED Diagnoses  Final diagnoses:  None  Constipation and post operative changes Liver hemangioma Right  renal cyst  Rx / DC Orders ED Discharge Orders     None

## 2022-01-07 NOTE — ED Provider Notes (Signed)
[facility] DEPT Provider Note   CSN: [id number] Arrival date & time: 01/07/22  8938     History  Chief Complaint  Patient presents with   Post-op Problem    Ellen Vasquez is a 46 y.o. female.  HPI Patient presents for postoperative abdominal discomfort.  2 days ago, she underwent TAH-BSO with Dr. Gaetano Net.  Approximately 24 hours later, she developed abdominal pain, greater on the right side, and distention.  This has persisted.  She describes intermittent mild nausea but denies any currently.  She has not had a bowel movement since her surgery.  She does continue to pass gas.  Due to her postoperative symptoms, she was sent to the ED by her OB/GYN, Dr. Gaetano Net.      Home Medications Prior to Admission medications   Medication Sig Start Date End Date Taking? Authorizing Provider  magnesium citrate SOLN Take 296 mLs (1 Bottle total) by mouth once for 1 dose. 01/07/22 01/07/22 Yes Godfrey Pick, MD  budesonide (ENTOCORT EC) 3 MG 24 hr capsule Take 3 capsules by mouth every morning. 09/21/17   [provider]  ibuprofen (ADVIL,MOTRIN) 600 MG tablet Take 1 tablet (600 mg total) by mouth every 6 (six) hours. 10/06/17   Molli Posey, MD  nystatin (MYCOSTATIN) 100000 UNIT/ML suspension Take 5 mLs by mouth 4 (four) times daily. 09/24/17   [provider]  ondansetron (ZOFRAN-ODT) 4 MG disintegrating tablet Take 4 mg by mouth every 8 (eight) hours as needed for nausea or vomiting.    [provider]  oxyCODONE-acetaminophen (PERCOCET/ROXICET) 5-325 MG tablet Take 1-2 tablets by mouth every 6 (six) hours as needed for severe pain. 10/06/17   Molli Posey, MD  PRENATAL 27-1 MG TABS Take 1 tablet by mouth daily.    [provider]  promethazine (PHENERGAN) 25 MG tablet Take 25 mg by mouth every 6 (six) hours as needed for nausea or vomiting.    [provider]  sertraline (ZOLOFT) 100 MG tablet Take by mouth.     [provider]  vancomycin (VANCOCIN) 125 MG capsule Take 4 times a day for 4 weeks (starting 7/18) Then twice a day for 1 week. Then once a day for 1 week. Then every other day for 2 weeks. 09/14/17   [provider]      Allergies    Shellfish allergy, Other, Sulfa antibiotics, Verapamil, and Tape    Review of Systems   Review of Systems  Gastrointestinal:  Positive for abdominal distention, abdominal pain, constipation and nausea.  All other systems reviewed and are negative.   Physical Exam Updated Vital Signs BP 118/71   Pulse 60   Temp (!) 97.5 F (36.4 C) (Oral)   Resp 18   Ht '5\' 9"'$  (1.753 m)   Wt 84 kg   LMP 09/05/2016 (Exact Date)   SpO2 99%   BMI 27.35 kg/m  Physical Exam Vitals and nursing note reviewed.  Constitutional:      General: She is not in acute distress.    Appearance: Normal appearance. She is well-developed. She is not ill-appearing, toxic-appearing or diaphoretic.  HENT:     Head: Normocephalic and atraumatic.     Right Ear: External ear normal.     Left Ear: External ear normal.     Nose: Nose normal.     Mouth/Throat:     Mouth: Mucous membranes are moist.     Pharynx: Oropharynx is clear.  Eyes:     Extraocular Movements:  Extraocular movements intact.     Conjunctiva/sclera: Conjunctivae normal.  Cardiovascular:     Rate and Rhythm: Normal rate and regular rhythm.  Pulmonary:     Effort: Pulmonary effort is normal. No respiratory distress.  Abdominal:     General: There is distension.     Palpations: Abdomen is soft.     Tenderness: There is abdominal tenderness. There is no guarding.  Musculoskeletal:        General: No swelling. Normal range of motion.     Cervical back: Normal range of motion and neck supple.     Right lower leg: No edema.     Left lower leg: No edema.  Skin:    General: Skin is warm and dry.     Capillary Refill: Capillary refill takes less than 2 seconds.     Coloration: Skin is not jaundiced  or pale.  Neurological:     General: No focal deficit present.     Mental Status: She is alert and oriented to person, place, and time.     Cranial Nerves: No cranial nerve deficit.     Sensory: No sensory deficit.     Motor: No weakness.     Coordination: Coordination normal.  Psychiatric:        Mood and Affect: Mood normal.        Behavior: Behavior normal.        Thought Content: Thought content normal.        Judgment: Judgment normal.     ED Results / Procedures / Treatments   Labs (all labs ordered are listed, but only abnormal results are displayed) Labs Reviewed  COMPREHENSIVE METABOLIC PANEL - Abnormal; Notable for the following components:      Result Value   Calcium 8.6 (*)    Anion gap 4 (*)    All other components within normal limits  URINALYSIS, ROUTINE W REFLEX MICROSCOPIC - Abnormal; Notable for the following components:   Color, Urine STRAW (*)    Hgb urine dipstick SMALL (*)    All other components within normal limits  CBC WITH DIFFERENTIAL/PLATELET  LIPASE, BLOOD  MAGNESIUM  LACTIC ACID, PLASMA    EKG None  Radiology CT ABDOMEN PELVIS W CONTRAST  Result Date: 01/07/2022 CLINICAL DATA:  Abdominal pain and bloating since hysterectomy on Monday. EXAM: CT ABDOMEN AND PELVIS WITH CONTRAST TECHNIQUE: Multidetector CT imaging of the abdomen and pelvis was performed using the standard protocol following bolus administration of intravenous contrast. RADIATION DOSE REDUCTION: This exam was performed according to the departmental dose-optimization program which includes automated exposure control, adjustment of the mA and/or kV according to patient size and/or use of iterative reconstruction technique. CONTRAST:  166m OMNIPAQUE IOHEXOL 300 MG/ML  SOLN COMPARISON:  CT February 13, 2015. FINDINGS: Lower chest: No acute abnormality. Hepatobiliary: Hypodense 2.3 cm lesion in the peripheral aspect of the right lobe of the liver on image 24/2 demonstrates peripheral  nodular enhancement with filling in of the lesion on delayed imaging compatible with a benign hemangioma. Gallbladder is distended. No biliary ductal dilation. Pancreas: No pancreatic ductal dilation or evidence of acute inflammation. Spleen: No splenomegaly. Adrenals/Urinary Tract: Bilateral adrenal glands appear normal. No hydronephrosis. Benign 4.6 cm right renal cyst requires no imaging follow-up. Kidneys demonstrate symmetric enhancement and excretion of contrast material. Gas in the urinary bladder. Stomach/Bowel: Stomach is unremarkable for degree of distension. No pathologic dilation of small or large bowel. The appendix is not confidently identified however there is no evidence of  acute appendicitis. Vascular/Lymphatic: Normal caliber abdominal aorta. No pathologically enlarged abdominal or pelvic lymph nodes. Reproductive: Uterus is surgically absent. Trace pelvic free fluid with loculated fluid versus a developing fluid collection in the recto uterine pouch measuring 5.9 x 3.1 cm on image 76/2. No suspicious adnexal mass. Other: Subcutaneous gas and stranding in the anterior abdominal wall and left rectus abdominus muscle is within normal limits in the early postsurgical setting. Musculoskeletal: No acute or significant osseous findings. IMPRESSION: 1. Postsurgical changes of recent hysterectomy with trace pelvic free fluid and a loculated fluid versus a developing fluid collection in the rectouterine pouch measuring 5.9 x 3.1 cm. 2. Gas in the urinary bladder, correlate for recent instrumentation. 3. Hypodense 2.3 cm lesion in the peripheral aspect of the right lobe of the liver demonstrates peripheral nodular enhancement with filling in of the lesion on delayed imaging compatible with a benign hemangioma. Electronically Signed   By: Dahlia Bailiff M.D.   On: 01/07/2022 12:46    Procedures Procedures    Medications Ordered in ED Medications  ibuprofen (ADVIL) tablet 600 mg (600 mg Oral Given  01/07/22 1052)  iohexol (OMNIPAQUE) 300 MG/ML solution 100 mL (100 mLs Intravenous Contrast Given 01/07/22 1203)    ED Course/ Medical Decision Making/ A&P                           Medical Decision Making Amount and/or Complexity of Data Reviewed Labs: ordered.  Risk OTC drugs. Prescription drug management.   This patient presents to the ED for concern of abdominal pain, this involves an extensive number of treatment options, and is a complaint that carries with it a high risk of complications and morbidity.  The differential diagnosis includes expected postoperative pain, postoperative complication, ileus, bowel obstruction, colitis   Co morbidities that complicate the patient evaluation  Endometriosis, migraines, nephrolithiasis, pyelonephritis, depression, anxiety, Carrie malformation type I, recent TAH-BSO   Additional history obtained:  Additional history obtained from patient's OB/GYN External records from outside source obtained and reviewed including EMR   Lab Tests:  I Ordered, and personally interpreted labs.  The pertinent results include: Normal hemoglobin, no leukocytosis, no evidence of UTI, normal electrolytes, normal lipase   Imaging Studies ordered:  I ordered imaging studies including CT of abdomen pelvis I independently visualized and interpreted imaging which showed expected postoperative changes I agree with the radiologist interpretation   Cardiac Monitoring: / EKG:  The patient was maintained on a cardiac monitor.  I personally viewed and interpreted the cardiac monitored which showed an underlying rhythm of: Sinus rhythm   Consultations Obtained:  I requested consultation with the OB/GYN, Dr. Gaetano Net,  and discussed lab and imaging findings as well as pertinent plan - they recommend: Discharge with outpatient follow-up   Problem List / ED Course / Critical interventions / Medication management  Patient is only 46 year old female,  currently 2 days postop from TAH-BSO, presenting for abdominal pain and distention.  Vital signs are normal on arrival.  Patient is well-appearing.  She does have abdominal distention and does endorse tenderness.  Abdominal pain is greater on right side.  She has not had a bowel movement since her surgery but is continued to pass gas.  I spoke with her OB/GYN prior to her arrival.  Plan will be for lab work and CT scan.  Ibuprofen was given for analgesia.  Work-up shows expected postoperative changes.  I discussed workup results with her OB/GYN, Dr. Gaetano Net,  who graciously came and visited the patient in the ED.  Dr. Gaetano Net does feel comfortable with discharge home and outpatient follow-up.  Patient was discharged in good condition. I ordered medication including ibuprofen for analgesia Reevaluation of the patient after these medicines showed that the patient improved I have reviewed the patients home medicines and have made adjustments as needed   Social Determinants of Health:  Has access to outpatient care        Final Clinical Impression(s) / ED Diagnoses Final diagnoses:  Post-operative pain  Abdominal distention    Rx / DC Orders ED Discharge Orders          Ordered    magnesium citrate SOLN   Once        01/07/22 1425              Godfrey Pick, MD 01/07/22 1425

## 2022-01-07 NOTE — ED Provider Triage Note (Signed)
Emergency Medicine Provider Triage Evaluation Note  Ellen Vasquez , a 46 y.o. female  was evaluated in triage.  Pt complains of abdominal pain after a hysterectomy.  Pt reports Gyn sent her here for a ct scan   Review of Systems  Positive: Pain and swelling Negative:  fever  Physical Exam  BP 136/86 (BP Location: Left Arm)   Pulse 70   Temp (!) 97.5 F (36.4 C) (Oral)   Resp 16   SpO2 100%  Gen:   Awake, no distress   Resp:  Normal effort  MSK:   Moves extremities without difficulty  Other:  Abdomen  swollen and tender   Medical Decision Making  Medically screening exam initiated at 9:41 AM.  Appropriate orders placed.  Ellen Vasquez was informed that the remainder of the evaluation will be completed by another provider, this initial triage assessment does not replace that evaluation, and the importance of remaining in the ED until their evaluation is complete.     Fransico Meadow, [place] 01/07/22 [phone]

## 2022-01-07 NOTE — ED Notes (Signed)
Pt stated she wanted to wait to get an IV to get blood work. This tech attempted to get blood work and pt stated she was scared and felt like she was going to pass out.

## 2022-01-30 ENCOUNTER — Emergency Department (HOSPITAL_BASED_OUTPATIENT_CLINIC_OR_DEPARTMENT_OTHER)
Admission: EM | Admit: 2022-01-30 | Discharge: 2022-01-30 | Disposition: A | Discharge status: Home / Self Care | Payer: BC Managed Care – PPO | Attending: Emergency Medicine | Admitting: Emergency Medicine

## 2022-01-30 ENCOUNTER — Other Ambulatory Visit: Payer: Self-pay

## 2022-01-30 ENCOUNTER — Encounter (HOSPITAL_BASED_OUTPATIENT_CLINIC_OR_DEPARTMENT_OTHER): Payer: Self-pay | Admitting: Emergency Medicine

## 2022-01-30 DIAGNOSIS — R197 Diarrhea, unspecified: Secondary | ICD-10-CM | POA: Diagnosis not present

## 2022-01-30 DIAGNOSIS — Z1152 Encounter for screening for COVID-19: Secondary | ICD-10-CM | POA: Insufficient documentation

## 2022-01-30 DIAGNOSIS — R1032 Left lower quadrant pain: Secondary | ICD-10-CM | POA: Diagnosis not present

## 2022-01-30 DIAGNOSIS — R109 Unspecified abdominal pain: Secondary | ICD-10-CM | POA: Insufficient documentation

## 2022-01-30 DIAGNOSIS — R630 Anorexia: Secondary | ICD-10-CM | POA: Diagnosis not present

## 2022-01-30 DIAGNOSIS — R1031 Right lower quadrant pain: Secondary | ICD-10-CM | POA: Diagnosis not present

## 2022-01-30 LAB — CBC
HCT: 47.1 % — ABNORMAL HIGH (ref 36.0–46.0)
Hemoglobin: 16.2 g/dL — ABNORMAL HIGH (ref 12.0–15.0)
MCH: 30.1 pg (ref 26.0–34.0)
MCHC: 34.4 g/dL (ref 30.0–36.0)
MCV: 87.4 fL (ref 80.0–100.0)
Platelets: 288 10*3/uL (ref 150–400)
RBC: 5.39 MIL/uL — ABNORMAL HIGH (ref 3.87–5.11)
RDW: 12 % (ref 11.5–15.5)
WBC: 7.4 10*3/uL (ref 4.0–10.5)
nRBC: 0 % (ref 0.0–0.2)

## 2022-01-30 LAB — URINALYSIS, ROUTINE W REFLEX MICROSCOPIC
Bilirubin Urine: NEGATIVE
Glucose, UA: NEGATIVE mg/dL
Ketones, ur: NEGATIVE mg/dL
Leukocytes,Ua: NEGATIVE
Nitrite: NEGATIVE
Protein, ur: NEGATIVE mg/dL
Specific Gravity, Urine: <1.005 — ABNORMAL LOW (ref 1.005–1.030)
pH: 5.5 (ref 5.0–8.0)

## 2022-01-30 LAB — C DIFFICILE QUICK SCREEN W PCR REFLEX
C Diff antigen: NEGATIVE
C Diff interpretation: NOT DETECTED
C Diff toxin: NEGATIVE

## 2022-01-30 LAB — LIPASE, BLOOD: Lipase: 36 U/L (ref 11–51)

## 2022-01-30 LAB — COMPREHENSIVE METABOLIC PANEL
ALT: 11 U/L (ref 0–44)
AST: 15 U/L (ref 15–41)
Albumin: 5.5 g/dL — ABNORMAL HIGH (ref 3.5–5.0)
Alkaline Phosphatase: 87 U/L (ref 38–126)
Anion gap: 13 (ref 5–15)
BUN: 6 mg/dL (ref 6–20)
CO2: 22 mmol/L (ref 22–32)
Calcium: 10.1 mg/dL (ref 8.9–10.3)
Chloride: 104 mmol/L (ref 98–111)
Creatinine, Ser: 0.79 mg/dL (ref 0.44–1.00)
GFR, Estimated: >60 mL/min (ref >60)
Glucose, Bld: 94 mg/dL (ref 70–99)
Potassium: 3.7 mmol/L (ref 3.5–5.1)
Sodium: 139 mmol/L (ref 135–145)
Total Bilirubin: 0.5 mg/dL (ref 0.3–1.2)
Total Protein: 8.2 g/dL — ABNORMAL HIGH (ref 6.5–8.1)

## 2022-01-30 LAB — RESP PANEL BY RT-PCR (RSV, FLU A&B, COVID)  RVPGX2
Influenza A by PCR: NEGATIVE
Influenza B by PCR: NEGATIVE
Resp Syncytial Virus by PCR: NEGATIVE
SARS Coronavirus 2 by RT PCR: NEGATIVE

## 2022-01-30 LAB — PREGNANCY, URINE: Preg Test, Ur: NEGATIVE

## 2022-01-30 MED ORDER — VANCOMYCIN HCL 125 MG PO CAPS: 125.0000 mg | ORAL_CAPSULE | Freq: Once | ORAL | Status: DC

## 2022-01-30 MED ORDER — VANCOMYCIN HCL 125 MG PO CAPS: 125.0000 mg | ORAL_CAPSULE | Freq: Four times a day (QID) | ORAL | 0 refills | Status: AC

## 2022-01-30 MED ORDER — LACTATED RINGERS IV BOLUS
1000.0000 mL | Freq: Once | INTRAVENOUS | Status: AC
  Administered 2022-01-30: 1000 mL via INTRAVENOUS

## 2022-01-30 NOTE — ED Triage Notes (Signed)
Pt presents to ED POV. Pt c/o diarrhea since Tuesday. Pt reports 40 bm in the past 16h. Reports hx of c diff

## 2022-01-30 NOTE — ED Provider Notes (Signed)
[facility] DEPT Provider Note   CSN: [id number] Arrival date & time: 01/30/22  1518     History  Chief Complaint  Patient presents with   Diarrhea    Ellen Vasquez is a 46 y.o. female.with past medical history of clostridium difficile, migraine who presents to the emergency department for diarrhea.  States symptoms began on Wednesday morning. She states that Tuesday night she had 1 or 2 looser bowel movements. She states by Wednesday morning she was having frequent loose stools and states in the past 24 hours she has had "55-60 bouts of diarrhea." She states that her stool is foul smelling and explosive. She has associated anorexia and left sided abdominal soreness. She denies blood in stool, nausea, vomiting, dizziness, syncope, shortness of breath. Recent history of hysterectomy on 01/05/22 and received IV antibiotics in the OR. She states afterward she had what sounds to be ileus and was sent to [facility] to rule out obstruction. States that she had diarrhea at that time for about 4-5 days but it calmed down. 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   Diarrhea Associated symptoms: abdominal pain   Associated symptoms: no fever and no vomiting        Home Medications Prior to Admission medications   Medication Sig Start Date End Date Taking? Authorizing Provider  vancomycin (VANCOCIN) 125 MG capsule Take 1 capsule (125 mg total) by mouth 4 (four) times daily for 14 days. 01/30/22 02/13/22 Yes Mickie Hillier, PA-C  budesonide (ENTOCORT EC) 3 MG 24 hr capsule Take 3 capsules by mouth every morning. 09/21/17   [provider]  ibuprofen (ADVIL,MOTRIN) 600 MG tablet Take 1 tablet (600 mg total) by mouth every 6 (six) hours. 10/06/17   Molli Posey, MD  nystatin (MYCOSTATIN) 100000 UNIT/ML suspension Take 5 mLs by mouth 4 (four) times daily. 09/24/17   [provider]  ondansetron (ZOFRAN-ODT) 4 MG  disintegrating tablet Take 4 mg by mouth every 8 (eight) hours as needed for nausea or vomiting.    [provider]  oxyCODONE-acetaminophen (PERCOCET/ROXICET) 5-325 MG tablet Take 1-2 tablets by mouth every 6 (six) hours as needed for severe pain. 10/06/17   Molli Posey, MD  PRENATAL 27-1 MG TABS Take 1 tablet by mouth daily.    [provider]  promethazine (PHENERGAN) 25 MG tablet Take 25 mg by mouth every 6 (six) hours as needed for nausea or vomiting.    [provider]  sertraline (ZOLOFT) 100 MG tablet Take by mouth.    [provider]      Allergies    Shellfish allergy, Other, Sulfa antibiotics, Verapamil, and Tape    Review of Systems   Review of Systems  Constitutional:  Positive for appetite change. Negative for fever.  Respiratory:  Positive for apnea. Negative for shortness of breath.   Gastrointestinal:  Positive for abdominal pain and diarrhea. Negative for blood in stool, constipation, nausea and vomiting.  Genitourinary:  Negative for dysuria.  Neurological:  Positive for light-headedness. Negative for dizziness and syncope.  All other systems reviewed and are negative.   Physical Exam Updated Vital Signs BP (!) 140/82   Pulse 62   Temp 97.9 F (36.6 C)   Resp 16   LMP 09/05/2016 (Exact Date)   SpO2 93%  Physical Exam Vitals and nursing note reviewed.  Constitutional:      General: She is not in acute distress.    Appearance: Normal appearance.  She is normal weight. She is not ill-appearing or toxic-appearing.  HENT:     Head: Normocephalic.     Mouth/Throat:     Mouth: Mucous membranes are moist.     Pharynx: Oropharynx is clear.  Eyes:     General: No scleral icterus.    Extraocular Movements: Extraocular movements intact.  Cardiovascular:     Rate and Rhythm: Normal rate and regular rhythm.     Pulses: Normal pulses.     Heart sounds: No murmur heard. Pulmonary:     Effort: Pulmonary effort is normal. No  respiratory distress.     Breath sounds: Normal breath sounds.  Abdominal:     General: Abdomen is protuberant. Bowel sounds are normal. There is no distension.     Palpations: Abdomen is soft.     Tenderness: There is abdominal tenderness in the left lower quadrant. There is no guarding or rebound.  Musculoskeletal:        General: Normal range of motion.     Cervical back: Neck supple.  Skin:    General: Skin is warm and dry.     Capillary Refill: Capillary refill takes less than 2 seconds.  Neurological:     General: No focal deficit present.     Mental Status: She is alert and oriented to person, place, and time. Mental status is at baseline.  Psychiatric:        Mood and Affect: Mood normal.        Behavior: Behavior normal.        Thought Content: Thought content normal.        Judgment: Judgment normal.     ED Results / Procedures / Treatments   Labs (all labs ordered are listed, but only abnormal results are displayed) Labs Reviewed  COMPREHENSIVE METABOLIC PANEL - Abnormal; Notable for the following components:      Result Value   Total Protein 8.2 (*)    Albumin 5.5 (*)    All other components within normal limits  CBC - Abnormal; Notable for the following components:   RBC 5.39 (*)    Hemoglobin 16.2 (*)    HCT 47.1 (*)    All other components within normal limits  URINALYSIS, ROUTINE W REFLEX MICROSCOPIC - Abnormal; Notable for the following components:   Color, Urine COLORLESS (*)    Specific Gravity, Urine <1.005 (*)    Hgb urine dipstick TRACE (*)    All other components within normal limits  RESP PANEL BY RT-PCR (RSV, FLU A&B, COVID)  RVPGX2  C DIFFICILE QUICK SCREEN W PCR REFLEX    GASTROINTESTINAL PANEL BY PCR, STOOL (REPLACES STOOL CULTURE)  LIPASE, BLOOD  PREGNANCY, URINE    EKG None  Radiology No results found.  Procedures Procedures   Medications Ordered in ED Medications  vancomycin (VANCOCIN) capsule 125 mg (has no administration in  time range)  lactated ringers bolus 1,000 mL (0 mLs Intravenous Stopped 01/30/22 2049)    ED Course/ Medical Decision Making/ A&P                           Medical Decision Making Amount and/or Complexity of Data Reviewed Labs: ordered.  Risk Prescription drug management.  Initial Impression and Ddx 46 year old female who presents to the emergency department with diarrhea.  On exam she is well-appearing, nonseptic, nontoxic in appearance.  She is not tachycardic or dry appearing.  Her abdomen is soft.  She has some mild  left lower quadrant abdominal tenderness without peritoneal findings. Patient PMH that increases complexity of ED encounter: Recurrent C. difficile Acute hepatobiliary disease, pancreatitis, appendicitis, PUD, gastritis, SBO, diverticulitis, colitis, viral gastroenteritis, Crohn's, UC, vascular catastrophe, UTI, pyelonephritis, renal stone, obstructed stone, infected stone, ovarian torsion, ectopic pregnancy, TOA, PID, STD, etc.  Interpretation of Diagnostics I independent reviewed and interpreted the labs as followed: CMP with no electrolyte derangements, no transaminitis or elevated bilirubin, no anion gap.  CBC with mild hemoconcentration.  Lipase is negative, not pregnant, UA without infection.  COVID, flu, RSV negative.  C. difficile and GI panel pending  - I independently visualized the following imaging with scope of interpretation limited to determining acute life threatening conditions related to emergency care: Not indicated  Patient Reassessment and Ultimate Disposition/Management Patient was given 1L LR fluid resuscitation.  Based on her labs and history and physical exam doubt acute hepatobiliary disease, pancreatitis, PUD or gastritis, SBO, vascular catastrophe, appendicitis.  UA is negative doubt UTI or pyelonephritis.  Symptoms are consistent with stone, obstructed stone, infected stone.  No vaginal symptoms concerning for PID, TOA.  Not pregnant so doubt  ectopic.   She does have significant diarrhea.  She has history significant for C. difficile infection multiple times.  She had recent surgery and was given IV antibiotics.  Unclear if this was significant enough nidus to cause her to have C. difficile again. Her C. difficile and GI panel labs are pending.  I called the lab to evaluate when these results would come back.  They estimated around midnight tonight. I had shared decision-making with the patient about discharge.  We discussed empirically treating her with p.o. vancomycin and following up on her labs and MyChart.  She is agreeable to this. Discussed that if the C. difficile is negative to stop taking vancomycin.  She can follow-up with her primary care provider regarding the results of the GI pathogen panel.  She already has a gastroenterologist and we discussed possible definitive treatment of C. difficile that she can discuss with her GI.  She is agreeable to this as well.  Given return precautions for significant diarrheal output and inability to tolerate p.o.  Do not feel that she needs imaging at this time.  The patient has been appropriately medically screened and/or stabilized in the ED. I have low suspicion for any other emergent medical condition which would require further screening, evaluation or treatment in the ED or require inpatient management. At time of discharge the patient is hemodynamically stable and in no acute distress. I have discussed work-up results and diagnosis with patient and answered all questions. Patient is agreeable with discharge plan. We discussed strict return precautions for returning to the emergency department and they verbalized understanding.     Patient management required discussion with the following services or consulting groups:  None  Complexity of Problems Addressed Acute complicated illness or Injury  Additional Data Reviewed and Analyzed Further history obtained from: Past medical  history and medications listed in the EMR, Recent PCP notes, Care Everywhere, and Prior labs/imaging results  Patient Encounter Risk Assessment Prescriptions and Consideration of hospitalization  Final Clinical Impression(s) / ED Diagnoses Final diagnoses:  Diarrhea of presumed infectious origin    Rx / DC Orders ED Discharge Orders          Ordered    vancomycin (VANCOCIN) 125 MG capsule  4 times daily        01/30/22 2034  Mickie Hillier, PA-C 01/30/22 2111    Ezequiel Essex, MD 01/31/22 [phone]

## 2022-01-30 NOTE — Discharge Instructions (Addendum)
You were seen in the emergency department today for diarrhea.  We are prescribing you vancomycin that you will take 4 times a day over the next 14 days.  Please follow-up in your MyChart for the results of your C. difficile and GI panel testing.  Please call your primary care provider with these results to review them and see if there is any need to change her treatment.  If your C. difficile testing comes back negative please stop taking the vancomycin.  Please drink feeling fluids.  Please return if you get have abdominal pain and fevers with your diarrhea.

## 2022-01-31 LAB — GASTROINTESTINAL PANEL BY PCR, STOOL (REPLACES STOOL CULTURE)

## 2022-11-22 DIAGNOSIS — R509 Fever, unspecified: Secondary | ICD-10-CM | POA: Diagnosis not present

## 2022-11-22 DIAGNOSIS — H6691 Otitis media, unspecified, right ear: Secondary | ICD-10-CM | POA: Diagnosis not present

## 2022-11-22 DIAGNOSIS — Z20822 Contact with and (suspected) exposure to covid-19: Secondary | ICD-10-CM | POA: Diagnosis not present

## 2022-11-29 ENCOUNTER — Other Ambulatory Visit: Payer: Self-pay

## 2022-11-29 ENCOUNTER — Encounter (HOSPITAL_COMMUNITY): Payer: Self-pay

## 2022-11-29 ENCOUNTER — Emergency Department (HOSPITAL_COMMUNITY): Payer: BC Managed Care – PPO

## 2022-11-29 ENCOUNTER — Emergency Department (HOSPITAL_COMMUNITY)
Admission: EM | Admit: 2022-11-29 | Discharge: 2022-11-29 | Disposition: A | Discharge status: Home / Self Care | Payer: BC Managed Care – PPO | Attending: Emergency Medicine | Admitting: Emergency Medicine

## 2022-11-29 DIAGNOSIS — N2889 Other specified disorders of kidney and ureter: Secondary | ICD-10-CM | POA: Diagnosis not present

## 2022-11-29 DIAGNOSIS — R000 Tachycardia, unspecified: Secondary | ICD-10-CM | POA: Insufficient documentation

## 2022-11-29 DIAGNOSIS — A0472 Enterocolitis due to Clostridium difficile, not specified as recurrent: Secondary | ICD-10-CM | POA: Insufficient documentation

## 2022-11-29 DIAGNOSIS — N281 Cyst of kidney, acquired: Secondary | ICD-10-CM | POA: Diagnosis not present

## 2022-11-29 DIAGNOSIS — D1803 Hemangioma of intra-abdominal structures: Secondary | ICD-10-CM | POA: Diagnosis not present

## 2022-11-29 DIAGNOSIS — R1032 Left lower quadrant pain: Secondary | ICD-10-CM | POA: Diagnosis not present

## 2022-11-29 DIAGNOSIS — R197 Diarrhea, unspecified: Secondary | ICD-10-CM | POA: Diagnosis not present

## 2022-11-29 LAB — CBC
HCT: 45.4 % (ref 36.0–46.0)
Hemoglobin: 15.2 g/dL — ABNORMAL HIGH (ref 12.0–15.0)
MCH: 30 pg (ref 26.0–34.0)
MCHC: 33.5 g/dL (ref 30.0–36.0)
MCV: 89.7 fL (ref 80.0–100.0)
Platelets: 264 10*3/uL (ref 150–400)
RBC: 5.06 MIL/uL (ref 3.87–5.11)
RDW: 12.2 % (ref 11.5–15.5)
WBC: 13 10*3/uL — ABNORMAL HIGH (ref 4.0–10.5)
nRBC: 0 % (ref 0.0–0.2)

## 2022-11-29 LAB — COMPREHENSIVE METABOLIC PANEL
ALT: 15 U/L (ref 0–44)
AST: 20 U/L (ref 15–41)
Albumin: 4.2 g/dL (ref 3.5–5.0)
Alkaline Phosphatase: 91 U/L (ref 38–126)
Anion gap: 11 (ref 5–15)
BUN: 6 mg/dL (ref 6–20)
CO2: 24 mmol/L (ref 22–32)
Calcium: 9.6 mg/dL (ref 8.9–10.3)
Chloride: 105 mmol/L (ref 98–111)
Creatinine, Ser: 1 mg/dL (ref 0.44–1.00)
GFR, Estimated: >60 mL/min (ref >60)
Glucose, Bld: 99 mg/dL (ref 70–99)
Potassium: 4 mmol/L (ref 3.5–5.1)
Sodium: 140 mmol/L (ref 135–145)
Total Bilirubin: 0.6 mg/dL (ref 0.3–1.2)
Total Protein: 7.3 g/dL (ref 6.5–8.1)

## 2022-11-29 LAB — URINALYSIS, ROUTINE W REFLEX MICROSCOPIC
Bilirubin Urine: NEGATIVE
Glucose, UA: NEGATIVE mg/dL
Ketones, ur: NEGATIVE mg/dL
Leukocytes,Ua: NEGATIVE
Nitrite: NEGATIVE
Protein, ur: NEGATIVE mg/dL
Specific Gravity, Urine: 1.01 (ref 1.005–1.030)
pH: 5 (ref 5.0–8.0)

## 2022-11-29 LAB — LIPASE, BLOOD: Lipase: 31 U/L (ref 11–51)

## 2022-11-29 LAB — C DIFFICILE QUICK SCREEN W PCR REFLEX
C Diff antigen: POSITIVE — AB
C Diff interpretation: DETECTED
C Diff toxin: POSITIVE — AB

## 2022-11-29 MED ORDER — IOHEXOL 350 MG/ML SOLN
75.0000 mL | Freq: Once | INTRAVENOUS | Status: AC | PRN
  Administered 2022-11-29: 75 mL via INTRAVENOUS

## 2022-11-29 MED ORDER — LACTATED RINGERS IV BOLUS
1000.0000 mL | Freq: Once | INTRAVENOUS | Status: AC
  Administered 2022-11-29: 1000 mL via INTRAVENOUS

## 2022-11-29 MED ORDER — VANCOMYCIN 50 MG/ML ORAL SOLUTION: 125.0000 mg | Freq: Four times a day (QID) | ORAL | 0 refills | Status: AC

## 2022-11-29 MED ORDER — MORPHINE SULFATE (PF) 4 MG/ML IV SOLN
4.0000 mg | Freq: Once | INTRAVENOUS | Status: AC
  Administered 2022-11-29: 4 mg via INTRAVENOUS
  Filled 2022-11-29: qty 1

## 2022-11-29 MED ORDER — VANCOMYCIN HCL 125 MG PO CAPS
125.0000 mg | ORAL_CAPSULE | Freq: Four times a day (QID) | ORAL | Status: DC
  Filled 2022-11-29 (×2): qty 1

## 2022-11-29 MED ORDER — ONDANSETRON 8 MG PO TBDP: 8.0000 mg | ORAL_TABLET | Freq: Three times a day (TID) | ORAL | 0 refills | Status: AC | PRN

## 2022-11-29 MED ORDER — NAPROXEN 375 MG PO TABS: 375.0000 mg | ORAL_TABLET | Freq: Two times a day (BID) | ORAL | 0 refills | Status: DC

## 2022-11-29 MED ORDER — ONDANSETRON HCL 4 MG/2ML IJ SOLN
4.0000 mg | Freq: Once | INTRAMUSCULAR | Status: AC
  Administered 2022-11-29: 4 mg via INTRAVENOUS
  Filled 2022-11-29: qty 2

## 2022-11-29 MED ORDER — OXYCODONE-ACETAMINOPHEN 5-325 MG PO TABS
1.0000 | ORAL_TABLET | Freq: Four times a day (QID) | ORAL | 0 refills | Status: AC | PRN
Start: 2022-11-29 — End: ?

## 2022-11-29 MED ORDER — ONDANSETRON 4 MG PO TBDP: 4.0000 mg | ORAL_TABLET | Freq: Once | ORAL | Status: DC | PRN

## 2022-11-29 NOTE — Discharge Instructions (Addendum)
Take the antibiotics until they are finished.  Follow-up with your doctor to be rechecked.  Take the medications for pain and nausea as needed.  Return to the ER for worsening symptoms

## 2022-11-29 NOTE — ED Triage Notes (Signed)
Pt to ED via POV. Pt states she started taking cefdinir for an ear infection approximately 6 days before starting to have diarrhea. Pt states she has had C.diff in the past and this is similar to her symptoms before. Pt states she has had diarrhea for past 2 days along with bodyaches. Pt states she went approximately 20 times yesterday and has to wear briefs because she is unable to get to the restroom in time. Pt has abdominal pain and nausea, denies vomiting. No sick contacts.

## 2022-11-29 NOTE — ED Provider Notes (Signed)
[place] EMERGENCY DEPARTMENT AT [facility] Provider Note   CSN: [id number] Arrival date & time: 11/29/22  1121     History  Chief Complaint  Patient presents with   Diarrhea    Ellen Vasquez is a 47 y.o. female.   Diarrhea    Patient has a history of endometriosis pyelonephritis prior C. difficile infection.  She presents to the ED for evaluation of abdominal pain diarrhea.  Patient states she took a course of cefdinir for an ear infection.  Patient subsequently started having multiple episodes of diarrhea in the last couple of days.  Patient states she has had frequent loose stool, approximately 20 times.  She is having abdominal pain as well as nausea.  She has not had any vomiting.  No known fevers.  Patient states this is very similar to when she had C. difficile in the past  Home Medications Prior to Admission medications   Medication Sig Start Date End Date Taking? Authorizing Provider  naproxen (NAPROSYN) 375 MG tablet Take 1 tablet (375 mg total) by mouth 2 (two) times daily. 11/29/22  Yes Linwood Dibbles, MD  ondansetron (ZOFRAN-ODT) 8 MG disintegrating tablet Take 1 tablet (8 mg total) by mouth every 8 (eight) hours as needed for nausea or vomiting. 11/29/22  Yes Linwood Dibbles, MD  oxyCODONE-acetaminophen (PERCOCET/ROXICET) 5-325 MG tablet Take 1 tablet by mouth every 6 (six) hours as needed for severe pain. 11/29/22  Yes Linwood Dibbles, MD  vancomycin (VANCOCIN) 50 mg/mL SOLN oral solution Take 2.5 mLs (125 mg total) by mouth every 6 (six) hours for 10 days. 11/29/22 12/09/22 Yes Linwood Dibbles, MD  budesonide (ENTOCORT EC) 3 MG 24 hr capsule Take 3 capsules by mouth every morning. 09/21/17   [provider]  ibuprofen (ADVIL,MOTRIN) 600 MG tablet Take 1 tablet (600 mg total) by mouth every 6 (six) hours. 10/06/17   Richarda Overlie, MD  nystatin (MYCOSTATIN) 100000 UNIT/ML suspension Take 5 mLs by mouth 4 (four) times daily. 09/24/17   [provider]   PRENATAL 27-1 MG TABS Take 1 tablet by mouth daily.    [provider]  promethazine (PHENERGAN) 25 MG tablet Take 25 mg by mouth every 6 (six) hours as needed for nausea or vomiting.    [provider]  sertraline (ZOLOFT) 100 MG tablet Take by mouth.    [provider]      Allergies    Shellfish allergy, Other, Sulfa antibiotics, Verapamil, and Tape    Review of Systems   Review of Systems  Gastrointestinal:  Positive for diarrhea.    Physical Exam Updated Vital Signs BP 116/86 (BP Location: Right Arm)   Pulse (!) 125   Temp 98.8 F (37.1 C)   Resp 17   Ht 1.753 m (5\' 9" )   Wt 77.1 kg   LMP 09/05/2016 (Exact Date)   SpO2 96%   BMI 25.10 kg/m  Physical Exam Vitals and nursing note reviewed.  Constitutional:      General: She is not in acute distress.    Appearance: She is well-developed. She is ill-appearing.  HENT:     Head: Normocephalic and atraumatic.     Right Ear: External ear normal.     Left Ear: External ear normal.  Eyes:     General: No scleral icterus.       Right eye: No discharge.        Left eye: No discharge.     Conjunctiva/sclera: Conjunctivae normal.  Neck:  Trachea: No tracheal deviation.  Cardiovascular:     Rate and Rhythm: Regular rhythm. Tachycardia present.  Pulmonary:     Effort: Pulmonary effort is normal. No respiratory distress.     Breath sounds: Normal breath sounds. No stridor. No wheezing or rales.  Abdominal:     General: Bowel sounds are normal. There is no distension.     Palpations: Abdomen is soft.     Tenderness: There is abdominal tenderness in the left lower quadrant. There is no guarding or rebound.  Musculoskeletal:        General: No tenderness or deformity.     Cervical back: Neck supple.  Skin:    General: Skin is warm and dry.     Findings: No rash.  Neurological:     General: No focal deficit present.     Mental Status: She is alert.     Cranial Nerves: No cranial nerve  deficit, dysarthria or facial asymmetry.     Sensory: No sensory deficit.     Motor: No abnormal muscle tone or seizure activity.     Coordination: Coordination normal.  Psychiatric:        Mood and Affect: Mood normal.     ED Results / Procedures / Treatments   Labs (all labs ordered are listed, but only abnormal results are displayed) Labs Reviewed  C DIFFICILE QUICK SCREEN W PCR REFLEX   - Abnormal; Notable for the following components:      Result Value   C Diff antigen POSITIVE (*)    C Diff toxin POSITIVE (*)    All other components within normal limits  CBC - Abnormal; Notable for the following components:   WBC 13.0 (*)    Hemoglobin 15.2 (*)    All other components within normal limits  URINALYSIS, ROUTINE W REFLEX MICROSCOPIC - Abnormal; Notable for the following components:   Hgb urine dipstick MODERATE (*)    Bacteria, UA RARE (*)    All other components within normal limits  GASTROINTESTINAL PANEL BY PCR, STOOL (REPLACES STOOL CULTURE)  LIPASE, BLOOD  COMPREHENSIVE METABOLIC PANEL    EKG None  Radiology CT ABDOMEN PELVIS W CONTRAST  Result Date: 11/29/2022 CLINICAL DATA:  Left lower quadrant abdominal pain. EXAM: CT ABDOMEN AND PELVIS WITH CONTRAST TECHNIQUE: Multidetector CT imaging of the abdomen and pelvis was performed using the standard protocol following bolus administration of intravenous contrast. RADIATION DOSE REDUCTION: This exam was performed according to the departmental dose-optimization program which includes automated exposure control, adjustment of the mA and/or kV according to patient size and/or use of iterative reconstruction technique. CONTRAST:  75mL OMNIPAQUE IOHEXOL 350 MG/ML SOLN COMPARISON:  CT of the abdomen and pelvis with contrast 01/07/2022 FINDINGS: Lower chest: The lung bases are clear without focal nodule, mass, or airspace disease. The heart size is normal. No significant pleural or pericardial effusion is present. Hepatobiliary: A  2.5 cm hypodense lesion within the lateral right aspect of the liver demonstrates peripheral filling in on delayed images, compatible with the stable hemangioma. No other focal hepatic lesions are present. The common bile duct and gallbladder normal Pancreas: Unremarkable. No pancreatic ductal dilatation or surrounding inflammatory changes. Spleen: Normal in size without focal abnormality. Adrenals/Urinary Tract: The adrenal glands are normal bilaterally. A 3.9 cm simple cyst present at the lower pole of the right kidney. Kidneys are otherwise unremarkable. No stone or mass lesion is present. The ureters are within normal limits bilaterally. The urinary bladder is normal. Stomach/Bowel: The stomach and  duodenum are within normal limits. Small bowel is unremarkable. The terminal ileum is normal. The appendix is visualized and normal. The ascending and proximal transverse colon are normal. The distal transverse colon, the descending colon and sigmoid colon are collapsed. No focal inflammatory change or mass lesion is present. Vascular/Lymphatic: No significant vascular findings are present. No enlarged abdominal or pelvic lymph nodes. Reproductive: Status post hysterectomy. No adnexal masses. Other: No abdominal wall hernia or abnormality. No abdominopelvic ascites. Musculoskeletal: The vertebral body heights and alignment are normal. No focal osseous lesion is present. The bony pelvis is normal. The hips are located and within normal limits bilaterally. IMPRESSION: 1. No acute or focal lesion to explain the patient's symptoms. 2. Stable 2.5 cm hemangioma within the lateral right aspect of the liver. 3. 3.9 cm simple cyst at the lower pole of the right kidney. No follow-up imaging is recommended. JACR 2018 Feb; 264-273, Management of the Incidental Renal Mass on CT, [employer] 2021; 814-848, Bosniak Classification of Cystic Renal Masses, Version 2019. 4. Status post hysterectomy. Electronically Signed   By:  Marin Roberts M.D.   On: 11/29/2022 15:11    Procedures Procedures    Medications Ordered in ED Medications  ondansetron (ZOFRAN-ODT) disintegrating tablet 4 mg (has no administration in time range)  vancomycin (VANCOCIN) capsule 125 mg (has no administration in time range)  morphine (PF) 4 MG/ML injection 4 mg (4 mg Intravenous Given 11/29/22 1405)  ondansetron (ZOFRAN) injection 4 mg (4 mg Intravenous Given 11/29/22 1405)  lactated ringers bolus 1,000 mL (1,000 mLs Intravenous New Bag/Given 11/29/22 1404)  iohexol (OMNIPAQUE) 350 MG/ML injection 75 mL (75 mLs Intravenous Contrast Given 11/29/22 1426)    ED Course/ Medical Decision Making/ A&P Clinical Course as of 11/29/22 1601  Sun Nov 29, 2022  1243 Labs reviewed.  Urinalysis not suggestive of UTI.  No signs of severe dehydration or electrolyte abnormalities.  White blood cell count elevated at 13 [JK]  1515 CT scan does not show any acute abnormality [JK]  1558 Patient reexamined tachycardia resolved [JK]    Clinical Course User Index [JK] Linwood Dibbles, MD                                 Medical Decision Making Problems Addressed: C. difficile diarrhea: acute illness or injury that poses a threat to life or bodily functions  Amount and/or Complexity of Data Reviewed Labs: ordered. Decision-making details documented in ED Course. Radiology: ordered and independent interpretation performed.  Risk Prescription drug management.   Patient presented to ED with complaints of abdominal pain diarrhea.  Patient has history of prior C. difficile.  She was notably tender on exam I was concerned about the possibility of diverticulitis and colitis.  CT scan however does not show any acute abnormality.  Patient was treated with IV pain medications fluids and was notably improved.  Her C. difficile test was positive.  Patient is nontoxic-appearing she appears appropriate for outpatient management.  Will give her a dose of oral  vancomycin and a prescription for vancomycin.  Gust close outpatient follow-up.  Warning signs precautions gust        Final Clinical Impression(s) / ED Diagnoses Final diagnoses:  C. difficile diarrhea    Rx / DC Orders ED Discharge Orders          Ordered    vancomycin (VANCOCIN) 50 mg/mL SOLN oral solution  Every 6 hours  11/29/22 1559    ondansetron (ZOFRAN-ODT) 8 MG disintegrating tablet  Every 8 hours PRN        11/29/22 1559    oxyCODONE-acetaminophen (PERCOCET/ROXICET) 5-325 MG tablet  Every 6 hours PRN        11/29/22 1559    naproxen (NAPROSYN) 375 MG tablet  2 times daily        11/29/22 1559              Linwood Dibbles, MD 11/29/22 1601

## 2022-11-30 LAB — GASTROINTESTINAL PANEL BY PCR, STOOL (REPLACES STOOL CULTURE)

## 2022-12-01 DIAGNOSIS — A0472 Enterocolitis due to Clostridium difficile, not specified as recurrent: Secondary | ICD-10-CM | POA: Diagnosis not present

## 2022-12-01 DIAGNOSIS — K581 Irritable bowel syndrome with constipation: Secondary | ICD-10-CM | POA: Diagnosis not present

## 2022-12-16 DIAGNOSIS — Z1231 Encounter for screening mammogram for malignant neoplasm of breast: Secondary | ICD-10-CM | POA: Diagnosis not present

## 2022-12-16 DIAGNOSIS — Z6826 Body mass index (BMI) 26.0-26.9, adult: Secondary | ICD-10-CM | POA: Diagnosis not present

## 2022-12-16 DIAGNOSIS — Z01419 Encounter for gynecological examination (general) (routine) without abnormal findings: Secondary | ICD-10-CM | POA: Diagnosis not present

## 2023-01-04 DIAGNOSIS — K581 Irritable bowel syndrome with constipation: Secondary | ICD-10-CM | POA: Diagnosis not present

## 2023-01-04 DIAGNOSIS — A0472 Enterocolitis due to Clostridium difficile, not specified as recurrent: Secondary | ICD-10-CM | POA: Diagnosis not present

## 2023-03-05 DIAGNOSIS — Z131 Encounter for screening for diabetes mellitus: Secondary | ICD-10-CM | POA: Diagnosis not present

## 2023-03-05 DIAGNOSIS — M79642 Pain in left hand: Secondary | ICD-10-CM | POA: Diagnosis not present

## 2023-03-05 DIAGNOSIS — G4739 Other sleep apnea: Secondary | ICD-10-CM | POA: Diagnosis not present

## 2023-03-05 DIAGNOSIS — Z0000 Encounter for general adult medical examination without abnormal findings: Secondary | ICD-10-CM | POA: Diagnosis not present

## 2023-03-05 DIAGNOSIS — Z13220 Encounter for screening for lipoid disorders: Secondary | ICD-10-CM | POA: Diagnosis not present

## 2023-03-05 DIAGNOSIS — Z1329 Encounter for screening for other suspected endocrine disorder: Secondary | ICD-10-CM | POA: Diagnosis not present

## 2023-03-05 DIAGNOSIS — Z1389 Encounter for screening for other disorder: Secondary | ICD-10-CM | POA: Diagnosis not present

## 2023-03-05 DIAGNOSIS — M79641 Pain in right hand: Secondary | ICD-10-CM | POA: Diagnosis not present

## 2023-03-30 DIAGNOSIS — R519 Headache, unspecified: Secondary | ICD-10-CM | POA: Diagnosis not present

## 2023-03-30 DIAGNOSIS — G4719 Other hypersomnia: Secondary | ICD-10-CM | POA: Diagnosis not present

## 2023-03-30 DIAGNOSIS — Z780 Asymptomatic menopausal state: Secondary | ICD-10-CM | POA: Diagnosis not present

## 2023-04-05 DIAGNOSIS — Z8619 Personal history of other infectious and parasitic diseases: Secondary | ICD-10-CM | POA: Diagnosis not present

## 2023-04-05 DIAGNOSIS — R197 Diarrhea, unspecified: Secondary | ICD-10-CM | POA: Diagnosis not present

## 2023-04-19 DIAGNOSIS — M79642 Pain in left hand: Secondary | ICD-10-CM | POA: Diagnosis not present

## 2023-04-19 DIAGNOSIS — M79672 Pain in left foot: Secondary | ICD-10-CM | POA: Diagnosis not present

## 2023-04-19 DIAGNOSIS — M79671 Pain in right foot: Secondary | ICD-10-CM | POA: Diagnosis not present

## 2023-04-19 DIAGNOSIS — M79641 Pain in right hand: Secondary | ICD-10-CM | POA: Diagnosis not present

## 2023-04-22 DIAGNOSIS — G4733 Obstructive sleep apnea (adult) (pediatric): Secondary | ICD-10-CM | POA: Diagnosis not present

## 2023-04-28 DIAGNOSIS — G4733 Obstructive sleep apnea (adult) (pediatric): Secondary | ICD-10-CM | POA: Diagnosis not present

## 2023-04-28 DIAGNOSIS — R768 Other specified abnormal immunological findings in serum: Secondary | ICD-10-CM | POA: Diagnosis not present

## 2023-04-28 DIAGNOSIS — F411 Generalized anxiety disorder: Secondary | ICD-10-CM | POA: Diagnosis not present

## 2023-04-28 DIAGNOSIS — R519 Headache, unspecified: Secondary | ICD-10-CM | POA: Diagnosis not present

## 2023-05-04 ENCOUNTER — Other Ambulatory Visit (INDEPENDENT_AMBULATORY_CARE_PROVIDER_SITE_OTHER): Payer: Self-pay

## 2023-05-04 ENCOUNTER — Ambulatory Visit: Admitting: Sports Medicine

## 2023-05-04 ENCOUNTER — Encounter: Payer: Self-pay | Admitting: Sports Medicine

## 2023-05-04 DIAGNOSIS — M222X1 Patellofemoral disorders, right knee: Secondary | ICD-10-CM | POA: Diagnosis not present

## 2023-05-04 DIAGNOSIS — M25552 Pain in left hip: Secondary | ICD-10-CM | POA: Diagnosis not present

## 2023-05-04 DIAGNOSIS — M25562 Pain in left knee: Secondary | ICD-10-CM

## 2023-05-04 DIAGNOSIS — Q682 Congenital deformity of knee: Secondary | ICD-10-CM | POA: Diagnosis not present

## 2023-05-04 DIAGNOSIS — M222X2 Patellofemoral disorders, left knee: Secondary | ICD-10-CM | POA: Diagnosis not present

## 2023-05-04 DIAGNOSIS — G8929 Other chronic pain: Secondary | ICD-10-CM

## 2023-05-04 DIAGNOSIS — R768 Other specified abnormal immunological findings in serum: Secondary | ICD-10-CM

## 2023-05-04 MED ORDER — CELECOXIB 100 MG PO CAPS: 100.0000 mg | ORAL_CAPSULE | Freq: Two times a day (BID) | ORAL | 0 refills | Status: DC

## 2023-05-04 NOTE — Progress Notes (Signed)
 Ellen Vasquez - 48 y.o. female MRN [id number]  Date of birth: 02-24-1975  Office Visit Note: Visit Date: 05/04/2023 PCP: [facility] Referred by: [facility]  Subjective: Chief Complaint  Patient presents with   Right Knee - Pain   Left Knee - Pain   Left Hip - Pain   HPI: Ellen Vasquez is a pleasant 48 y.o. female who presents today for chronic bilateral knee pain and acute on chronic left hip pain.  Knees - she reports "crunching" in both of her knees that has gotten progressively worse.  She has increased pain when going up and down stairs.   Left hip pain -the onset of her hip pain started after the birth of her second child in 2009 with some pain as well as clicking and popping, months following this it did not give her as much pain but she has had chronic clicking and popping about the hip. However, this has become painful progressively over the last 6 months. She has pain in the front of the hip that will radiate into the groin as well as wraparound the posterior side.  She is a very active individual, working out 4-5 times weekly.  She is having limitation of her walking and exercise due to her pain.  She takes Advil as needed and uses ice/heat.  She currently is being worked up for rheumatologic conditions -she has a positive ANA.  She also has a positive family history with her father who has rheumatoid arthritis.  She has a personal history of Raynaud's phenomenon.  Pertinent ROS were reviewed with the patient and found to be negative unless otherwise specified above in HPI.   Assessment & Plan: Visit Diagnoses:  1. Chronic left hip pain   2. Chronic pain of left knee   3. Patellofemoral arthralgia of both knees   4. Patella alta   5. ANA positive    Plan: Discussed with [name] that etiology of her conditions.  She has had chronic hip pain with clicking and popping, and her exam suggest likely degenerative labral tear.  Her  x-rays show no arthritic change about the hip.  We discussed getting her started into formalized physical therapy and trialing an ultrasound-guided left hip injection for both diagnostic and therapeutic purposes.  She can schedule this at her leisure.  She does have bilateral knee pain but her left knee is worse, she has bilateral patellofemoral arthritis with the left knee worse than her right as well as patella alta.  I would like this to be addressed with physical therapy as well.  I did review her labs as well as previous rheumatoid workup which she is positive for ANA, has a history of Raynaud's disease and does have some dactylitis of her fingers.  Given these findings and her family history of her father having rheumatoid arthritis, I did discuss with her that she does fit an inflammatory picture with no conclusive rheumatologic condition yet diagnosed.  For her inflammation in her joint pains, we will start her on Celebrex 100 mg which she may take once to twice daily.  Discussed taking this for only a few weeks at a time and then taking holiday as to not disrupt her GI system.  Follow-up: Return for schedule for L-hip injection under [place] .   Meds & Orders: No orders of the defined types were placed in this encounter.   Orders Placed This Encounter  Procedures   XR Knee Complete 4 Views  Right   XR Knee Complete 4 Views Left   XR HIP UNILAT W OR W/O PELVIS 2-3 VIEWS LEFT   Ambulatory referral to Physical Therapy    Procedures: No procedures performed      Clinical History: No specialty comments available.  She reports that she quit smoking about 21 years ago. Her smoking use included cigarettes. She has never used smokeless tobacco. No results for input(s): "HGBA1C", "LABURIC" in the last 8760 hours.  Objective:   Vital Signs: LMP 09/05/2016 (Exact Date)   Physical Exam  Gen: Well-appearing, in no acute distress; non-toxic CV: Well-perfused. Warm.  Resp: Breathing unlabored on room  air; no wheezing. Psych: Fluid speech in conversation; appropriate affect; normal thought process  Ortho Exam - Left hip: There is no bony tenderness to palpation, no overlying soft tissue swelling.  She has good strength with flexion and lateral hip abduction bilaterally.  There is positive Stinchfield, equivocal FADIR, positive FABER testing with associated C-sign +.  The hips move fluidly without restriction with internal and external log roll.  - Bilateral knees: There is likely a trace effusion on the left greater than right knee.  There is full range of motion with flexion and extension.  There is bilateral patellar crepitus with left greater than right.  Positive patellar compression test and mildly positive J sign on the left compared to the right.  Imaging: XR Knee Complete 4 Views Right Result Date: 05/04/2023 3 views of bilateral knee including AP PA standing, Rosenberg, lateral views were ordered and reviewed by myself today.  X-rays demonstrate no significant tibiofemoral joint space narrowing or arthritic change.  There is a left knee patella with left greater than right patellofemoral arthritic change.  Otherwise no acute osseous abnormalities noted.  Likely trace effusion of each knee.  XR Knee Complete 4 Views Left Result Date: 05/04/2023 3 views of bilateral knee including AP PA standing, Rosenberg, lateral views were ordered and reviewed by myself today.  X-rays demonstrate no significant tibiofemoral joint space narrowing or arthritic change.  There is a left knee patella with left greater than right patellofemoral arthritic change.  Otherwise no acute osseous abnormalities noted.  Likely trace effusion of each knee.  XR HIP UNILAT W OR W/O PELVIS 2-3 VIEWS LEFT Result Date: 05/04/2023 2 views of the left hip including AP pelvis and left hip lateral film were ordered and reviewed by myself.  Femoral head is well-seated within acetabulum.  There is no significant arthritic change.   No other acute bony abnormality noted.   Past Medical/Family/Surgical/Social History: Medications & Allergies reviewed per EMR, new medications updated. Patient Active Problem List   Diagnosis Date Noted   [redacted] weeks gestation of pregnancy    Dizziness    PVC (premature ventricular contraction)    Palpitations 09/29/2017   [redacted] weeks gestation of pregnancy    Nausea    Abdominal discomfort in right upper quadrant    Anorexia 07/15/2017   Diarrhea 04/30/2017   Gastrointestinal symptoms during pregnancy in first trimester, antepartum 04/26/2017   E coli infection 04/23/2017   Hyperemesis 04/21/2017   Diarrhea of presumed infectious origin    Abdominal pain, vomiting, and diarrhea    Dyspnea on exertion 03/24/2017   Chronic migraine without aura, with intractable migraine, so stated, with status migrainosus 09/12/2015   Migraine 06/04/2015   Sick headache 03/13/2015   SVD (spontaneous vaginal delivery) 08/30/2014   Active labor 08/29/2014   [redacted] weeks gestation of pregnancy    Abnormal fetal  ultrasound    Amniotic problem in pregnancy, antepartum    Encounter for fetal anatomic survey    Maternal age 66+, multigravida, antepartum    Migraine-cluster headache syndrome 05/11/2014   Headache in pregnancy    Cyst of spleen 07/26/2013   Hypoglycemia 02/10/2013   ABDOMINAL PAIN-LLQ 05/22/2008   ABNORMAL EXAM-BILIARY TRACT 05/22/2008   HEMANGIOMA, HEPATIC 05/17/2008   DEPRESSION 05/17/2008   Endometriosis 05/17/2008   DYSMENORRHEA 05/17/2008   MENORRHAGIA 05/17/2008   Arthropathy 05/17/2008   Headache 05/17/2008   Personal history of urinary disorder 05/17/2008   Past Medical History:  Diagnosis Date   Anxiety    zoloft 150 mg    Chiari malformation type I (HCC)    MRI [facility] 2013   Depression    PPD with 2nd and 3rd baby (zoloft)   Endometriosis 1997   Headache    no meds    Headache in pregnancy    Hearing loss    Kidney infection    Kidney stones    current pregnancy     Otosclerosis 2008   Otosclerosis    Family History  Problem Relation Age of Onset   Asthma Father    Arthritis Father        Rheumatoid   Cancer Maternal Grandmother        skin   Hypertension Other    Hyperlipidemia Other    Stroke Other    Obesity Other    Aneurysm Paternal Grandfather        brain   Skin cancer Paternal Grandmother    Migraines Neg Hx    Past Surgical History:  Procedure Laterality Date   DILATION AND CURETTAGE OF UTERUS  May 2015   LAPAROSCOPIC ABDOMINAL EXPLORATION  06/2013   has had 4 surgeries   SHOULDER SURGERY  2000   TONSILLECTOMY AND ADENOIDECTOMY  2010   TUBAL LIGATION N/A 10/05/2017   Procedure: POST PARTUM TUBAL LIGATION;  Surgeon: Harold Hedge, MD;  Location: [facility] BIRTHING SUITES;  Service: Gynecology;  Laterality: N/A;   WISDOM TOOTH EXTRACTION  2008   Social History   Occupational History   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

## 2023-05-04 NOTE — Progress Notes (Signed)
 Patient says that she has had crunching in the knees that has gotten progressively worse. She says that it is especially noticeable when going up the stairs. She does have pain in the left knee that starts above the patella and tracks down the medial border of the patella. She says that this has gotten worse over the last couple of months.  Patient says that she has had left hip pain since giving birth to her second child in 2009, but this has gotten more painful in the last 6 months. She has pain in the front of the hip and groin, and this wraps around the side to the back of the hip.  She is active and typically exercises 4-5 times per week. Because of her hip and knee pain, she is limited to 2-3 days per week now. She has not done anything to treat the knees or the hip in the past. She takes Advil as needed, and alternates between ice and heat.

## 2023-05-10 DIAGNOSIS — M1991 Primary osteoarthritis, unspecified site: Secondary | ICD-10-CM | POA: Diagnosis not present

## 2023-05-10 DIAGNOSIS — M79641 Pain in right hand: Secondary | ICD-10-CM | POA: Diagnosis not present

## 2023-05-10 DIAGNOSIS — R768 Other specified abnormal immunological findings in serum: Secondary | ICD-10-CM | POA: Diagnosis not present

## 2023-05-10 DIAGNOSIS — M79642 Pain in left hand: Secondary | ICD-10-CM | POA: Diagnosis not present

## 2023-05-11 ENCOUNTER — Encounter: Payer: Self-pay | Admitting: Sports Medicine

## 2023-05-11 ENCOUNTER — Other Ambulatory Visit: Payer: Self-pay

## 2023-05-11 ENCOUNTER — Ambulatory Visit: Admitting: Sports Medicine

## 2023-05-11 DIAGNOSIS — M25552 Pain in left hip: Secondary | ICD-10-CM | POA: Diagnosis not present

## 2023-05-11 DIAGNOSIS — G8929 Other chronic pain: Secondary | ICD-10-CM

## 2023-05-11 MED ORDER — METHYLPREDNISOLONE ACETATE 40 MG/ML IJ SUSP
60.0000 mg | INTRAMUSCULAR | Status: AC | PRN
Start: 2023-05-11 — End: 2023-05-11
  Administered 2023-05-11: 60 mg via INTRA_ARTICULAR

## 2023-05-11 MED ORDER — LIDOCAINE HCL 1 % IJ SOLN
4.0000 mL | INTRAMUSCULAR | Status: AC | PRN
Start: 2023-05-11 — End: 2023-05-11
  Administered 2023-05-11: 4 mL

## 2023-05-11 NOTE — Progress Notes (Signed)
         Procedure Note  Patient: Ellen Vasquez             Date of Birth: 11-May-1975           MRN: [id number]             Visit Date: 05/11/2023  Procedures: Visit Diagnoses:  1. Chronic left hip pain    Large Joint Inj: L hip joint on 05/11/2023 3:35 PM Indications: pain and diagnostic evaluation Details: 22 G 3.5 in needle, ultrasound-guided anterior approach Medications: 4 mL lidocaine 1 %; 60 mg methylPREDNISolone acetate 40 MG/ML Outcome: tolerated well, no immediate complications  Procedure: US-guided intra-articular hip injection, Left After discussion on risks/benefits/indications and informed verbal consent was obtained, a timeout was performed. Patient was lying supine on exam table. The hip was cleaned with betadine and alcohol swabs. Then utilizing ultrasound guidance, the patient's femoral head and neck junction was identified and subsequently injected with 4:1.5 lidocaine:depomedrol via an in-plane approach with ultrasound visualization of the injectate administered into the hip joint. Patient tolerated procedure well without immediate complications.  Procedure, treatment alternatives, risks and benefits explained, specific risks discussed. Consent was given by the patient. Immediately prior to procedure a time out was called to verify the correct patient, procedure, equipment, support staff and site/side marked as required. Patient was prepped and draped in the usual sterile fashion.     - post-injection protocol discussed - continue PT; she may update me after a few weeks on benefit/relief  Madelyn Brunner, DO Primary Care Sports Medicine Physician  [facility]  This note was dictated using Dragon naturally speaking software and may contain errors in syntax, spelling, or content which have not been identified prior to signing this note.

## 2023-05-14 DIAGNOSIS — G4733 Obstructive sleep apnea (adult) (pediatric): Secondary | ICD-10-CM | POA: Diagnosis not present

## 2023-05-24 ENCOUNTER — Encounter: Payer: Self-pay | Admitting: Physical Therapy

## 2023-05-24 ENCOUNTER — Ambulatory Visit: Admitting: Physical Therapy

## 2023-05-24 DIAGNOSIS — M25562 Pain in left knee: Secondary | ICD-10-CM | POA: Diagnosis not present

## 2023-05-24 DIAGNOSIS — G8929 Other chronic pain: Secondary | ICD-10-CM

## 2023-05-24 DIAGNOSIS — M25552 Pain in left hip: Secondary | ICD-10-CM

## 2023-05-24 DIAGNOSIS — M6281 Muscle weakness (generalized): Secondary | ICD-10-CM

## 2023-05-24 DIAGNOSIS — M25561 Pain in right knee: Secondary | ICD-10-CM

## 2023-05-24 NOTE — Therapy (Signed)
 OUTPATIENT PHYSICAL THERAPY LOWER EXTREMITY EVALUATION   Patient Name: Ellen Vasquez MRN: [id number] DOB:1975-07-05, 48 y.o., female Today's Date: 05/24/2023  END OF SESSION:  PT End of Session - 05/24/23 1627     Visit Number 1    Number of Visits 10    Date for PT Re-Evaluation 08/02/23    PT Start Time 1520    PT Stop Time 1605    PT Time Calculation (min) 45 min    Activity Tolerance Patient tolerated treatment well    Behavior During Therapy [facility] for tasks assessed/performed             Past Medical History:  Diagnosis Date   Anxiety    zoloft 150 mg    Chiari malformation type I (HCC)    MRI [facility] 2013   Depression    PPD with 2nd and 3rd baby (zoloft)   Endometriosis 1997   Headache    no meds    Headache in pregnancy    Hearing loss    Kidney infection    Kidney stones    current pregnancy    Otosclerosis 2008   Otosclerosis    Past Surgical History:  Procedure Laterality Date   DILATION AND CURETTAGE OF UTERUS  May 2015   LAPAROSCOPIC ABDOMINAL EXPLORATION  06/2013   has had 4 surgeries   SHOULDER SURGERY  2000   TONSILLECTOMY AND ADENOIDECTOMY  2010   TUBAL LIGATION N/A 10/05/2017   Procedure: POST PARTUM TUBAL LIGATION;  Surgeon: Harold Hedge, MD;  Location: [facility] BIRTHING SUITES;  Service: Gynecology;  Laterality: N/A;   WISDOM TOOTH EXTRACTION  2008   Patient Active Problem List   Diagnosis Date Noted   [redacted] weeks gestation of pregnancy    Dizziness    PVC (premature ventricular contraction)    Palpitations 09/29/2017   [redacted] weeks gestation of pregnancy    Nausea    Abdominal discomfort in right upper quadrant    Anorexia 07/15/2017   Diarrhea 04/30/2017   Gastrointestinal symptoms during pregnancy in first trimester, antepartum 04/26/2017   E coli infection 04/23/2017   Hyperemesis 04/21/2017   Diarrhea of presumed infectious origin    Abdominal pain, vomiting, and diarrhea    Dyspnea on exertion 03/24/2017   Chronic migraine without  aura, with intractable migraine, so stated, with status migrainosus 09/12/2015   Migraine 06/04/2015   Sick headache 03/13/2015   SVD (spontaneous vaginal delivery) 08/30/2014   Active labor 08/29/2014   [redacted] weeks gestation of pregnancy    Abnormal fetal ultrasound    Amniotic problem in pregnancy, antepartum    Encounter for fetal anatomic survey    Maternal age 72+, multigravida, antepartum    Migraine-cluster headache syndrome 05/11/2014   Headache in pregnancy    Cyst of spleen 07/26/2013   Hypoglycemia 02/10/2013   ABDOMINAL PAIN-LLQ 05/22/2008   ABNORMAL EXAM-BILIARY TRACT 05/22/2008   HEMANGIOMA, HEPATIC 05/17/2008   DEPRESSION 05/17/2008   Endometriosis 05/17/2008   DYSMENORRHEA 05/17/2008   MENORRHAGIA 05/17/2008   Arthropathy 05/17/2008   Headache 05/17/2008   Personal history of urinary disorder 05/17/2008    PCP: [facility]   REFERRING PROVIDER: Madelyn Brunner, DO   REFERRING DIAG: [phone] (ICD-10-CM) - Chronic pain of left knee M25.552,G89.29 (ICD-10-CM) - Chronic left hip pain M22.2X1,M22.2X2 (ICD-10-CM) - Patellofemoral arthralgia of both knees Q68.2 (ICD-10-CM) - Patella alta  THERAPY DIAG:  Chronic pain of left knee  Chronic pain of right knee  Pain in left hip  Muscle  weakness (generalized)  Rationale for Evaluation and Treatment: Rehabilitation  ONSET DATE: 2 year onset of pain  SUBJECTIVE:   SUBJECTIVE STATEMENT: Her hip pain is better since the injection, but it does still pop and click. Her left knee pain is her biggest complaint and worse than Rt knee. Pain for 2 years without MOI, It has been limiting her activity more over the last year  PERTINENT HISTORY:see above PMH, MD suspects labral tear  PAIN:  NPRS scale: 6/10 left knee, 3/10 right knee, 1/10 for left hip Pain location:anterior knees, does get popping and clicking at top of her left knee,  Pain description: dull pain, but does get sharp at times  with activity Aggravating factors: exercise, running, stairs Relieving factors: nsaids, has tried ice but only temporarily    PRECAUTIONS: None  RED FLAGS: None   WEIGHT BEARING RESTRICTIONS: No  FALLS:  Has patient fallen in last 6 months? No  OCCUPATION: professor history  PLOF: Independent  PATIENT GOALS:   NEXT MD VISIT:   OBJECTIVE:  Note: Objective measures were completed at Evaluation unless otherwise noted.  DIAGNOSTIC FINDINGS: XR hip 05/04/23 "2 views of the left hip including AP pelvis and left hip lateral film were  ordered and reviewed by Dr. Shon Baton.  Femoral head is well-seated within  acetabulum.  There is no significant arthritic change.  No other acute  bony abnormality noted. "  Knee XR 05/04/23 "3 views of bilateral knee including AP PA standing, Rosenberg, lateral  views were ordered and reviewed by myself today (Dr. Shon Baton).  X-rays demonstrate no significant tibiofemoral joint space narrowing or arthritic change.  There is a left knee patella with left greater than right patellofemoral  arthritic change.  Otherwise no acute osseous abnormalities noted.  Likely  trace effusion of each knee. "   PATIENT SURVEYS:  Patient-Specific Activity Scoring Scheme  "0" represents "unable to perform." "10" represents "able to perform at prior level. 0 1 2 3 4 5 6 7 8 9  10 (Date and Score)   Activity Eval     1. eliiptical 5     2. running 5     3. walking 6   4.Stairs 6   5. exercising 6   Score 28/50    Total score = sum of the activity scores/number of activities Minimum detectable change (90%CI) for average score = 2 points Minimum detectable change (90%CI) for single activity score = 3 points    MUSCLE LENGTH: Hamstrings: Right 52 deg; Left 60 deg Thomas test: Right 5 inch from heel to buttock deg; Left 6  deg   LOWER EXTREMITY ROM:  Active ROM Right eval Left eval  Hip flexion    Hip extension    Hip abduction    Hip adduction     Hip internal rotation    Hip external rotation    Knee flexion WNL WNL  Knee extension WNL WNL  Ankle dorsiflexion    Ankle plantarflexion    Ankle inversion    Ankle eversion     (Blank rows = not tested)  LOWER EXTREMITY MMT:  MMT Right eval Left eval  Hip flexion 4 4-  Hip extension 4- 4-  Hip abduction 4 4-  Hip adduction 4- 4-  Hip internal rotation    Hip external rotation    Knee flexion    Knee extension 45.3# avg  38.6# avg  Ankle dorsiflexion    Ankle plantarflexion    Ankle inversion    Ankle  eversion     (Blank rows = not tested)   FUNCTIONAL TESTS:  05/24/23 5 times sit to stand: 8.5 sec without UE support but some knee valgus bilat noted Single leg stance: can hold 20 sec bilat but increased sway noted  GAIT: Comments: WNL                                                                                                                                TODAY'S TREATMENT:  Eval HEP creation and review with demonstration and trial set preformed, see below for details Leg extension machine DL 96# X 5 with slow eccentric lower Leg curl machine DL 29# X 10, slow eccentrics Leg press machine DL 52# X 10, slow eccentrics, then SL 50# X 5 each leg Selfcare:KT taping techniques, gym equipment she can use, education on pain levels and not to work beyond 3/10 pain with any exercises or activity when possible as to not aggravate her knee    PATIENT EDUCATION: Education details: HEP, PT plan of care Person educated: Patient Education method: Explanation, Demonstration, Verbal cues, and Handouts Education comprehension: verbalized understanding and needs further education   HOME EXERCISE PROGRAM: Access Code: [email or web address] URL: [email or web address] Date: 05/24/2023 Prepared by: Ivery Quale  Exercises - Standing Quadriceps Stretch  - 1 x daily - 6 x weekly - 1 sets - 3 reps - 30 hold - Seated Hamstring Stretch  - 1 x daily - 6 x weekly - 1 sets -  3 reps - 30 hold - Lunge with Counter Support  - 1 x daily - 7 x weekly - 1-2 sets - 10 reps - 3 sec lower hold - Sit to Stand Without Arm Support  - 2 x daily - 6 x weekly - 1-2 sets - 10 reps - 3 sec lower hold - Seated Straight Leg Raise with Quad Contraction  - 2 x daily - 6 x weekly - 2 sets - 10 reps - 3 sec lower hold - Standing Hip Abduction with Resistance at Ankles and Counter Support  - 2 x daily - 6 x weekly - 1-2 sets - 15-20 reps - Hip Extension with Resistance Loop  - 2 x daily - 6 x weekly - 1-2 sets - 15-20 reps  ASSESSMENT:  CLINICAL IMPRESSION: Patient referred to PT for Left hip pain with suspected labral tear and Bilateral knee pain with patellofemoral arthritis. Patient will benefit from skilled PT to address below impairments, limitations and improve overall function.  OBJECTIVE IMPAIRMENTS: decreased activity tolerance, difficulty walking, decreased balance, decreased mobility, decreased ROM, decreased strength, impaired flexibility, and pain.  ACTIVITY LIMITATIONS: bending, lifting, carry, locomotion, cleaning, community activity, driving, and or occupation  PERSONAL FACTORS: see above PMH are also affecting patient's functional outcome.  REHAB POTENTIAL: Good  CLINICAL DECISION MAKING: Stable/uncomplicated  EVALUATION COMPLEXITY: Low    GOALS: Short term PT Goals Target date: 06/21/2023   Pt will be I and compliant with  HEP. Baseline:  Goal status: New Pt will decrease pain by 25% overall Baseline: Goal status: New  Long term PT goals Target date:08/02/2023   Pt will improve hamstring length to >70 deg to  improve functional mobility Baseline: Goal status: New Pt will improve  hip/knee strength to at least 5-/5 MMT and 50# or more HHD knee extension) to improve functional strength Baseline: Goal status: New Pt will improve PSFS to at least 40/50 functional to show improved function Baseline: Goal status: New Pt will reduce pain by overall 50%  overall with usual activity Baseline: Goal status: New Pt will reduce pain to overall less than 2-3/10 with usual activity and work activity and exercise activity. Baseline: Goal status: New  PLAN: PT FREQUENCY: 1- times per 1-2 weeks   PT DURATION:  4-10 weeks  PLANNED INTERVENTIONS (unless contraindicated): aquatic PT, Canalith repositioning, cryotherapy, Electrical stimulation, Iontophoresis with 4 mg/ml dexamethasome, Moist heat, traction, Ultrasound, gait training, Therapeutic exercise, balance training, neuromuscular re-education, patient/family education, manual techniques, passive ROM, dry needling, taping, vasopnuematic device, vestibular, spinal manipulations, joint manipulations 97110-Therapeutic exercises, 97530- Therapeutic activity, [id number]- Neuromuscular re-education, 97535- Self Care, and [id number]- Manual therapy  PLAN FOR NEXT SESSION: how was KT tape? How is HEP? Needs hip/knee strength focus    April Manson, PT,DPT 05/24/2023, 4:29 PM

## 2023-06-14 DIAGNOSIS — G4733 Obstructive sleep apnea (adult) (pediatric): Secondary | ICD-10-CM | POA: Diagnosis not present

## 2023-06-15 ENCOUNTER — Ambulatory Visit (INDEPENDENT_AMBULATORY_CARE_PROVIDER_SITE_OTHER): Admitting: Physical Therapy

## 2023-06-15 ENCOUNTER — Encounter: Payer: Self-pay | Admitting: Physical Therapy

## 2023-06-15 DIAGNOSIS — M25552 Pain in left hip: Secondary | ICD-10-CM

## 2023-06-15 DIAGNOSIS — M25562 Pain in left knee: Secondary | ICD-10-CM | POA: Diagnosis not present

## 2023-06-15 DIAGNOSIS — M6281 Muscle weakness (generalized): Secondary | ICD-10-CM | POA: Diagnosis not present

## 2023-06-15 DIAGNOSIS — G8929 Other chronic pain: Secondary | ICD-10-CM

## 2023-06-15 DIAGNOSIS — M25561 Pain in right knee: Secondary | ICD-10-CM | POA: Diagnosis not present

## 2023-06-15 NOTE — Therapy (Signed)
 OUTPATIENT PHYSICAL THERAPY LOWER EXTREMITY EVALUATION   Patient Name: Ellen Vasquez MRN: [id number] DOB:17-May-1975, 48 y.o., female Today's Date: 06/15/2023  END OF SESSION:  PT End of Session - 06/15/23 1513     Visit Number 2    Number of Visits 10    Date for PT Re-Evaluation 08/02/23    PT Start Time 1515    PT Stop Time 1600    PT Time Calculation (min) 45 min    Activity Tolerance Patient tolerated treatment well    Behavior During Therapy [facility] for tasks assessed/performed             Past Medical History:  Diagnosis Date   Anxiety    zoloft  150 mg    Chiari malformation type I (HCC)    MRI [facility] 2013   Depression    PPD with 2nd and 3rd baby (zoloft )   Endometriosis 1997   Headache    no meds    Headache in pregnancy    Hearing loss    Kidney infection    Kidney stones    current pregnancy    Otosclerosis 2008   Otosclerosis    Past Surgical History:  Procedure Laterality Date   DILATION AND CURETTAGE OF UTERUS  May 2015   LAPAROSCOPIC ABDOMINAL EXPLORATION  06/2013   has had 4 surgeries   SHOULDER SURGERY  2000   TONSILLECTOMY AND ADENOIDECTOMY  2010   TUBAL LIGATION N/A 10/05/2017   Procedure: POST PARTUM TUBAL LIGATION;  Surgeon: Thora Flint, MD;  Location: [facility] BIRTHING SUITES;  Service: Gynecology;  Laterality: N/A;   WISDOM TOOTH EXTRACTION  2008   Patient Active Problem List   Diagnosis Date Noted   [redacted] weeks gestation of pregnancy    Dizziness    PVC (premature ventricular contraction)    Palpitations 09/29/2017   [redacted] weeks gestation of pregnancy    Nausea    Abdominal discomfort in right upper quadrant    Anorexia 07/15/2017   Diarrhea 04/30/2017   Gastrointestinal symptoms during pregnancy in first trimester, antepartum 04/26/2017   E coli infection 04/23/2017   Hyperemesis 04/21/2017   Diarrhea of presumed infectious origin    Abdominal pain, vomiting, and diarrhea    Dyspnea on exertion 03/24/2017   Chronic migraine  without aura, with intractable migraine, so stated, with status migrainosus 09/12/2015   Migraine 06/04/2015   Sick headache 03/13/2015   SVD (spontaneous vaginal delivery) 08/30/2014   Active labor 08/29/2014   [redacted] weeks gestation of pregnancy    Abnormal fetal ultrasound    Amniotic problem in pregnancy, antepartum    Encounter for fetal anatomic survey    Maternal age 8+, multigravida, antepartum    Migraine-cluster headache syndrome 05/11/2014   Headache in pregnancy    Cyst of spleen 07/26/2013   Hypoglycemia 02/10/2013   ABDOMINAL PAIN-LLQ 05/22/2008   ABNORMAL EXAM-BILIARY TRACT 05/22/2008   HEMANGIOMA, HEPATIC 05/17/2008   DEPRESSION 05/17/2008   Endometriosis 05/17/2008   DYSMENORRHEA 05/17/2008   MENORRHAGIA 05/17/2008   Arthropathy 05/17/2008   Headache 05/17/2008   Personal history of urinary disorder 05/17/2008    PCP: [facility]   REFERRING PROVIDER: Shauna Del, DO   REFERRING DIAG: [phone] (ICD-10-CM) - Chronic pain of left knee M25.552,G89.29 (ICD-10-CM) - Chronic left hip pain M22.2X1,M22.2X2 (ICD-10-CM) - Patellofemoral arthralgia of both knees Q68.2 (ICD-10-CM) - Patella alta  THERAPY DIAG:  Chronic pain of left knee  Chronic pain of right knee  Pain in left hip  Muscle  weakness (generalized)  Rationale for Evaluation and Treatment: Rehabilitation  ONSET DATE: 2 year onset of pain  SUBJECTIVE:   SUBJECTIVE STATEMENT: Relays more pain in knees and hip as she started playing tennis for first time in a while last night for about one hour. She plans to do this once a week on Mondays. She does feel the KT tape helped some.  PERTINENT HISTORY:see above PMH, MD suspects labral tear  PAIN:  NPRS scale: 6/10 in hips and knees today Pain location:anterior knees, does get popping and clicking at top of her left knee,  Pain description: dull pain, but does get sharp at times with activity Aggravating factors: exercise,  running, stairs Relieving factors: nsaids, has tried ice but only temporarily    PRECAUTIONS: None  RED FLAGS: None   WEIGHT BEARING RESTRICTIONS: No  FALLS:  Has patient fallen in last 6 months? No  OCCUPATION: professor history  PLOF: Independent  PATIENT GOALS:   NEXT MD VISIT:   OBJECTIVE:  Note: Objective measures were completed at Evaluation unless otherwise noted.  DIAGNOSTIC FINDINGS: XR hip 05/04/23 "2 views of the left hip including AP pelvis and left hip lateral film were  ordered and reviewed by Dr. Vaughn Georges.  Femoral head is well-seated within  acetabulum.  There is no significant arthritic change.  No other acute  bony abnormality noted. "  Knee XR 05/04/23 "3 views of bilateral knee including AP PA standing, Rosenberg, lateral  views were ordered and reviewed by myself today (Dr. Vaughn Georges).  X-rays demonstrate no significant tibiofemoral joint space narrowing or arthritic change.  There is a left knee patella with left greater than right patellofemoral  arthritic change.  Otherwise no acute osseous abnormalities noted.  Likely  trace effusion of each knee. "   PATIENT SURVEYS:  Patient-Specific Activity Scoring Scheme  "0" represents "unable to perform." "10" represents "able to perform at prior level. 0 1 2 3 4 5 6 7 8 9  10 (Date and Score)   Activity Eval     1. eliiptical 5     2. running 5     3. walking 6   4.Stairs 6   5. exercising 6   Score 28/50    Total score = sum of the activity scores/number of activities Minimum detectable change (90%CI) for average score = 2 points Minimum detectable change (90%CI) for single activity score = 3 points    MUSCLE LENGTH: Hamstrings: Right 52 deg; Left 60 deg Thomas test: Right 5 inch from heel to buttock deg; Left 6  inch   LOWER EXTREMITY ROM:  Active ROM Right eval Left eval  Hip flexion    Hip extension    Hip abduction    Hip adduction    Hip internal rotation    Hip external  rotation    Knee flexion WNL WNL  Knee extension WNL WNL  Ankle dorsiflexion    Ankle plantarflexion    Ankle inversion    Ankle eversion     (Blank rows = not tested)  LOWER EXTREMITY MMT:  MMT Right eval Left eval  Hip flexion 4 4-  Hip extension 4- 4-  Hip abduction 4 4-  Hip adduction 4- 4-  Hip internal rotation    Hip external rotation    Knee flexion    Knee extension 45.3# avg  38.6# avg  Ankle dorsiflexion    Ankle plantarflexion    Ankle inversion    Ankle eversion     (Blank rows =  not tested)   FUNCTIONAL TESTS:  05/24/23 5 times sit to stand: 8.5 sec without UE support but some knee valgus bilat noted Single leg stance: can hold 20 sec bilat but increased sway noted  GAIT: Comments: WNL                                                                                                                                TODAY'S TREATMENT:  06/15/23 Recumbent bike L3 X 5 min Standing quad stretch 30 sec X 3 bilat using strap Seated hamstring stretch 30 sec X 3 bilat Seated SLR 2#, 2X10 bilat Supine hip abduction SLR 2X10 bilat Supine hip circles 2X10 bilat of Clockwise and CCW circles  Manual therapy: IASTM with massage gun to bilateral hips. KT tape to bilat knees 2 I strips to circumduct patella, and one I strip vertical at joint line.   Eval HEP creation and review with demonstration and trial set preformed, see below for details Leg extension machine DL 16# X 5 with slow eccentric lower Leg curl machine DL 10# X 10, slow eccentrics Leg press machine DL 96# X 10, slow eccentrics, then SL 50# X 5 each leg Selfcare:KT taping techniques, gym equipment she can use, education on pain levels and not to work beyond 3/10 pain with any exercises or activity when possible as to not aggravate her knee    PATIENT EDUCATION: Education details: HEP, PT plan of care Person educated: Patient Education method: Explanation, Demonstration, Verbal cues, and  Handouts Education comprehension: verbalized understanding and needs further education   HOME EXERCISE PROGRAM: Access Code: [email or web address] URL: [email or web address] Date: 05/24/2023 Prepared by: Jamee Mazzoni  Exercises - Standing Quadriceps Stretch  - 1 x daily - 6 x weekly - 1 sets - 3 reps - 30 hold - Seated Hamstring Stretch  - 1 x daily - 6 x weekly - 1 sets - 3 reps - 30 hold - Lunge with Counter Support  - 1 x daily - 7 x weekly - 1-2 sets - 10 reps - 3 sec lower hold - Sit to Stand Without Arm Support  - 2 x daily - 6 x weekly - 1-2 sets - 10 reps - 3 sec lower hold - Seated Straight Leg Raise with Quad Contraction  - 2 x daily - 6 x weekly - 2 sets - 10 reps - 3 sec lower hold - Standing Hip Abduction with Resistance at Ankles and Counter Support  - 2 x daily - 6 x weekly - 1-2 sets - 15-20 reps - Hip Extension with Resistance Loop  - 2 x daily - 6 x weekly - 1-2 sets - 15-20 reps  ASSESSMENT:  CLINICAL IMPRESSION: Pain worse today after onset of tennis, did try massage gun and KT tape today after exercises to see if this helps with pain any. I did offer DN but she was a little apprehensive about this, may be open to it in future  visit. She does show good understanding of HEP.    OBJECTIVE IMPAIRMENTS: decreased activity tolerance, difficulty walking, decreased balance, decreased mobility, decreased ROM, decreased strength, impaired flexibility, and pain.  ACTIVITY LIMITATIONS: bending, lifting, carry, locomotion, cleaning, community activity, driving, and or occupation  PERSONAL FACTORS: see above PMH are also affecting patient's functional outcome.  REHAB POTENTIAL: Good  CLINICAL DECISION MAKING: Stable/uncomplicated  EVALUATION COMPLEXITY: Low    GOALS: Short term PT Goals Target date: 06/21/2023   Pt will be I and compliant with HEP. Baseline:  Goal status: New Pt will decrease pain by 25% overall Baseline: Goal status: New  Long term PT goals  Target date:08/02/2023   Pt will improve hamstring length to >70 deg to  improve functional mobility Baseline: Goal status: New Pt will improve  hip/knee strength to at least 5-/5 MMT and 50# or more HHD knee extension) to improve functional strength Baseline: Goal status: New Pt will improve PSFS to at least 40/50 functional to show improved function Baseline: Goal status: New Pt will reduce pain by overall 50% overall with usual activity Baseline: Goal status: New Pt will reduce pain to overall less than 2-3/10 with usual activity and work activity and exercise activity. Baseline: Goal status: New  PLAN: PT FREQUENCY: 1- times per 1-2 weeks   PT DURATION:  4-10 weeks  PLANNED INTERVENTIONS (unless contraindicated): aquatic PT, Canalith repositioning, cryotherapy, Electrical stimulation, Iontophoresis with 4 mg/ml dexamethasome, Moist heat, traction, Ultrasound, gait training, Therapeutic exercise, balance training, neuromuscular re-education, patient/family education, manual techniques, passive ROM, dry needling, taping, vasopnuematic device, vestibular, spinal manipulations, joint manipulations 97110-Therapeutic exercises, 97530- Therapeutic activity, [id number]- Neuromuscular re-education, 97535- Self Care, and [id number]- Manual therapy  PLAN FOR NEXT SESSION: how was KT tape? How is HEP? Needs hip/knee strength focus    Mick Alamin, PT,DPT 06/15/2023, 4:05 PM

## 2023-06-29 ENCOUNTER — Ambulatory Visit: Admitting: Physical Therapy

## 2023-06-29 DIAGNOSIS — M25552 Pain in left hip: Secondary | ICD-10-CM | POA: Diagnosis not present

## 2023-06-29 DIAGNOSIS — M25561 Pain in right knee: Secondary | ICD-10-CM | POA: Diagnosis not present

## 2023-06-29 DIAGNOSIS — M25562 Pain in left knee: Secondary | ICD-10-CM | POA: Diagnosis not present

## 2023-06-29 DIAGNOSIS — M6281 Muscle weakness (generalized): Secondary | ICD-10-CM

## 2023-06-29 DIAGNOSIS — G8929 Other chronic pain: Secondary | ICD-10-CM

## 2023-06-29 NOTE — Therapy (Addendum)
 OUTPATIENT PHYSICAL THERAPY LOWER EXTREMITY TREATMENT PHYSICAL THERAPY DISCHARGE SUMMARY  Visits from Start of Care: 3  Current functional level related to goals / functional outcomes: See below   Remaining deficits: See below   Education / Equipment: HEP  Plan:  Patient goals were not met. Patient is being discharged due to not returning since last visit >30 days   Redell Moose, PT, DPT 08/23/23 9:38 AM    Patient Name: Ellen Vasquez MRN: [id number] DOB:01-27-1976, 48 y.o., female Today's Date: 06/29/2023  END OF SESSION:  PT End of Session - 06/29/23 1014     Visit Number 3    Number of Visits 10    Date for PT Re-Evaluation 08/02/23    PT Start Time 1015    PT Stop Time 1055    PT Time Calculation (min) 40 min    Activity Tolerance Patient tolerated treatment well    Behavior During Therapy [facility] for tasks assessed/performed              Past Medical History:  Diagnosis Date   Anxiety    zoloft  150 mg    Chiari malformation type I (HCC)    MRI [facility] 2013   Depression    PPD with 2nd and 3rd baby (zoloft )   Endometriosis 1997   Headache    no meds    Headache in pregnancy    Hearing loss    Kidney infection    Kidney stones    current pregnancy    Otosclerosis 2008   Otosclerosis    Past Surgical History:  Procedure Laterality Date   DILATION AND CURETTAGE OF UTERUS  May 2015   LAPAROSCOPIC ABDOMINAL EXPLORATION  06/2013   has had 4 surgeries   SHOULDER SURGERY  2000   TONSILLECTOMY AND ADENOIDECTOMY  2010   TUBAL LIGATION N/A 10/05/2017   Procedure: POST PARTUM TUBAL LIGATION;  Surgeon: Curlene Agent, MD;  Location: [facility] BIRTHING SUITES;  Service: Gynecology;  Laterality: N/A;   WISDOM TOOTH EXTRACTION  2008   Patient Active Problem List   Diagnosis Date Noted   [redacted] weeks gestation of pregnancy    Dizziness    PVC (premature ventricular contraction)    Palpitations 09/29/2017   [redacted] weeks gestation of pregnancy    Nausea    Abdominal  discomfort in right upper quadrant    Anorexia 07/15/2017   Diarrhea 04/30/2017   Gastrointestinal symptoms during pregnancy in first trimester, antepartum 04/26/2017   E coli infection 04/23/2017   Hyperemesis 04/21/2017   Diarrhea of presumed infectious origin    Abdominal pain, vomiting, and diarrhea    Dyspnea on exertion 03/24/2017   Chronic migraine without aura, with intractable migraine, so stated, with status migrainosus 09/12/2015   Migraine 06/04/2015   Sick headache 03/13/2015   SVD (spontaneous vaginal delivery) 08/30/2014   Active labor 08/29/2014   [redacted] weeks gestation of pregnancy    Abnormal fetal ultrasound    Amniotic problem in pregnancy, antepartum    Encounter for fetal anatomic survey    Maternal age 24+, multigravida, antepartum    Migraine-cluster headache syndrome 05/11/2014   Headache in pregnancy    Cyst of spleen 07/26/2013   Hypoglycemia 02/10/2013   ABDOMINAL PAIN-LLQ 05/22/2008   ABNORMAL EXAM-BILIARY TRACT 05/22/2008   HEMANGIOMA, HEPATIC 05/17/2008   DEPRESSION 05/17/2008   Endometriosis 05/17/2008   DYSMENORRHEA 05/17/2008   MENORRHAGIA 05/17/2008   Arthropathy 05/17/2008   Headache 05/17/2008   Personal history of urinary disorder 05/17/2008  PCP: [facility]   REFERRING PROVIDER: Burnetta Brunet, DO   REFERRING DIAG: [phone] (ICD-10-CM) - Chronic pain of left knee M25.552,G89.29 (ICD-10-CM) - Chronic left hip pain M22.2X1,M22.2X2 (ICD-10-CM) - Patellofemoral arthralgia of both knees Q68.2 (ICD-10-CM) - Patella alta  THERAPY DIAG:  Chronic pain of left knee  Chronic pain of right knee  Pain in left hip  Muscle weakness (generalized)  Rationale for Evaluation and Treatment: Rehabilitation  ONSET DATE: 2 year onset of pain  SUBJECTIVE:   SUBJECTIVE STATEMENT: Pt states her L hips were killing her. Has not been as consistent with HEP this past week. Took her celebrex  so not hurting as much this  morning.   PERTINENT HISTORY:see above PMH, MD suspects labral tear  PAIN:  NPRS scale: 6/10 in hips and knees today Pain location:anterior knees, does get popping and clicking at top of her left knee,  Pain description: dull pain, but does get sharp at times with activity Aggravating factors: exercise, running, stairs Relieving factors: nsaids, has tried ice but only temporarily    PRECAUTIONS: None  RED FLAGS: None   WEIGHT BEARING RESTRICTIONS: No  FALLS:  Has patient fallen in last 6 months? No  OCCUPATION: professor history  PLOF: Independent  PATIENT GOALS:   NEXT MD VISIT:   OBJECTIVE:  Note: Objective measures were completed at Evaluation unless otherwise noted.  DIAGNOSTIC FINDINGS: XR hip 05/04/23 2 views of the left hip including AP pelvis and left hip lateral film were  ordered and reviewed by Dr. Burnetta.  Femoral head is well-seated within  acetabulum.  There is no significant arthritic change.  No other acute  bony abnormality noted.   Knee XR 05/04/23 3 views of bilateral knee including AP PA standing, Rosenberg, lateral  views were ordered and reviewed by myself today (Dr. Burnetta).  X-rays demonstrate no significant tibiofemoral joint space narrowing or arthritic change.  There is a left knee patella with left greater than right patellofemoral  arthritic change.  Otherwise no acute osseous abnormalities noted.  Likely  trace effusion of each knee.    PATIENT SURVEYS:  Patient-Specific Activity Scoring Scheme  0 represents "unable to perform." 10 represents "able to perform at prior level. 0 1 2 3 4 5 6 7 8 9  10 (Date and Score)   Activity Eval     1. eliiptical 5     2. running 5     3. walking 6   4.Stairs 6   5. exercising 6   Score 28/50    Total score = sum of the activity scores/number of activities Minimum detectable change (90%CI) for average score = 2 points Minimum detectable change (90%CI) for single activity score = 3  points    MUSCLE LENGTH: Hamstrings: Right 52 deg; Left 60 deg Thomas test: Right 5 inch from heel to buttock deg; Left 6  inch   LOWER EXTREMITY ROM:  Active ROM Right eval Left eval  Hip flexion    Hip extension    Hip abduction    Hip adduction    Hip internal rotation    Hip external rotation    Knee flexion WNL WNL  Knee extension WNL WNL  Ankle dorsiflexion    Ankle plantarflexion    Ankle inversion    Ankle eversion     (Blank rows = not tested)  LOWER EXTREMITY MMT:  MMT Right eval Left eval  Hip flexion 4 4-  Hip extension 4- 4-  Hip abduction 4 4-  Hip adduction 4- 4-  Hip internal rotation    Hip external rotation    Knee flexion    Knee extension 45.3# avg  38.6# avg  Ankle dorsiflexion    Ankle plantarflexion    Ankle inversion    Ankle eversion     (Blank rows = not tested)   FUNCTIONAL TESTS:  05/24/23 5 times sit to stand: 8.5 sec without UE support but some knee valgus bilat noted Single leg stance: can hold 20 sec bilat but increased sway noted  GAIT: Comments: WNL                                                                                                                                TODAY'S TREATMENT:  06/29/23 Scifit bike x 5 min IASTM with massage roller L glute med/glute max Supine piriformis stretch 3x30 Supine ITB stretch with strap x30 Sidelying clamshell red TB 2x10 Sidelying hip circles 2x10 CW & CCW Supine figure 4 stretch x 30 Supine figure 4 bridge 2x10 Supine hamstring stretch with strap x 30   06/15/23 Recumbent bike L3 X 5 min Standing quad stretch 30 sec X 3 bilat using strap Seated hamstring stretch 30 sec X 3 bilat Seated SLR 2#, 2X10 bilat Supine hip abduction SLR 2X10 bilat Supine hip circles 2X10 bilat of Clockwise and CCW circles  Manual therapy: IASTM with massage gun to bilateral hips. KT tape to bilat knees 2 I strips to circumduct patella, and one I strip vertical at joint  line.   Eval HEP creation and review with demonstration and trial set preformed, see below for details Leg extension machine DL 89# X 5 with slow eccentric lower Leg curl machine DL 64# X 10, slow eccentrics Leg press machine DL 24# X 10, slow eccentrics, then SL 50# X 5 each leg Selfcare:KT taping techniques, gym equipment she can use, education on pain levels and not to work beyond 3/10 pain with any exercises or activity when possible as to not aggravate her knee    PATIENT EDUCATION: Education details: HEP, PT plan of care Person educated: Patient Education method: Explanation, Demonstration, Verbal cues, and Handouts Education comprehension: verbalized understanding and needs further education   HOME EXERCISE PROGRAM: Access Code: [email or web address] URL: [email or web address] Date: 06/29/2023 Prepared by: Deatrice Spanbauer April Earnie Starring   ASSESSMENT:  CLINICAL IMPRESSION: Continued hip and core stabilization this session. Provided new hip stretches to address her lateral hip pain. Updated HEP accordingly. Deferred ktape this session.   OBJECTIVE IMPAIRMENTS: decreased activity tolerance, difficulty walking, decreased balance, decreased mobility, decreased ROM, decreased strength, impaired flexibility, and pain.  ACTIVITY LIMITATIONS: bending, lifting, carry, locomotion, cleaning, community activity, driving, and or occupation  PERSONAL FACTORS: see above PMH are also affecting patient's functional outcome.  REHAB POTENTIAL: Good  CLINICAL DECISION MAKING: Stable/uncomplicated  EVALUATION COMPLEXITY: Low    GOALS: Short term PT Goals Target date: 06/21/2023   Pt will be I and compliant with  HEP. Baseline:  Goal status: IN PROGRESS  Pt will decrease pain by 25% overall Baseline: Goal status: IN PROGRESS  Long term PT goals Target date:08/02/2023   Pt will improve hamstring length to >70 deg to  improve functional mobility Baseline: Goal status: New Pt will  improve  hip/knee strength to at least 5-/5 MMT and 50# or more HHD knee extension) to improve functional strength Baseline: Goal status: New Pt will improve PSFS to at least 40/50 functional to show improved function Baseline: Goal status: New Pt will reduce pain by overall 50% overall with usual activity Baseline: Goal status: New Pt will reduce pain to overall less than 2-3/10 with usual activity and work activity and exercise activity. Baseline: Goal status: New  PLAN: PT FREQUENCY: 1- times per 1-2 weeks   PT DURATION:  4-10 weeks  PLANNED INTERVENTIONS (unless contraindicated): aquatic PT, Canalith repositioning, cryotherapy, Electrical stimulation, Iontophoresis with 4 mg/ml dexamethasome, Moist heat, traction, Ultrasound, gait training, Therapeutic exercise, balance training, neuromuscular re-education, patient/family education, manual techniques, passive ROM, dry needling, taping, vasopnuematic device, vestibular, spinal manipulations, joint manipulations 97110-Therapeutic exercises, 97530- Therapeutic activity, [id number]- Neuromuscular re-education, 97535- Self Care, and 02859- Manual therapy  PLAN FOR NEXT SESSION:  How is HEP? Needs hip/knee strength focus    Ellen Vasquez, PT,DPT 06/29/2023, 10:17 AM

## 2023-07-11 ENCOUNTER — Other Ambulatory Visit: Payer: Self-pay | Admitting: Sports Medicine

## 2023-07-13 ENCOUNTER — Encounter: Admitting: Physical Therapy

## 2023-07-14 DIAGNOSIS — G4733 Obstructive sleep apnea (adult) (pediatric): Secondary | ICD-10-CM | POA: Diagnosis not present

## 2023-08-11 DIAGNOSIS — G4733 Obstructive sleep apnea (adult) (pediatric): Secondary | ICD-10-CM | POA: Diagnosis not present

## 2023-08-11 DIAGNOSIS — R0981 Nasal congestion: Secondary | ICD-10-CM | POA: Diagnosis not present

## 2023-08-14 DIAGNOSIS — G4733 Obstructive sleep apnea (adult) (pediatric): Secondary | ICD-10-CM | POA: Diagnosis not present

## 2023-08-16 ENCOUNTER — Encounter (INDEPENDENT_AMBULATORY_CARE_PROVIDER_SITE_OTHER): Payer: Self-pay

## 2023-09-13 DIAGNOSIS — G4733 Obstructive sleep apnea (adult) (pediatric): Secondary | ICD-10-CM | POA: Diagnosis not present

## 2023-09-22 DIAGNOSIS — L578 Other skin changes due to chronic exposure to nonionizing radiation: Secondary | ICD-10-CM | POA: Diagnosis not present

## 2023-09-22 DIAGNOSIS — L700 Acne vulgaris: Secondary | ICD-10-CM | POA: Diagnosis not present

## 2023-09-22 DIAGNOSIS — L814 Other melanin hyperpigmentation: Secondary | ICD-10-CM | POA: Diagnosis not present

## 2023-10-14 DIAGNOSIS — G4733 Obstructive sleep apnea (adult) (pediatric): Secondary | ICD-10-CM | POA: Diagnosis not present

## 2023-10-28 ENCOUNTER — Encounter (INDEPENDENT_AMBULATORY_CARE_PROVIDER_SITE_OTHER): Payer: Self-pay | Admitting: Otolaryngology

## 2023-10-28 ENCOUNTER — Ambulatory Visit (INDEPENDENT_AMBULATORY_CARE_PROVIDER_SITE_OTHER): Admitting: Otolaryngology

## 2023-10-28 VITALS — BP 108/78 | HR 77 | Ht 69.0 in | Wt 175.0 lb

## 2023-10-28 DIAGNOSIS — H73893 Other specified disorders of tympanic membrane, bilateral: Secondary | ICD-10-CM

## 2023-10-28 DIAGNOSIS — R0981 Nasal congestion: Secondary | ICD-10-CM | POA: Diagnosis not present

## 2023-10-28 DIAGNOSIS — J343 Hypertrophy of nasal turbinates: Secondary | ICD-10-CM

## 2023-10-28 DIAGNOSIS — H900 Conductive hearing loss, bilateral: Secondary | ICD-10-CM

## 2023-10-28 DIAGNOSIS — G4733 Obstructive sleep apnea (adult) (pediatric): Secondary | ICD-10-CM | POA: Diagnosis not present

## 2023-10-28 DIAGNOSIS — J3489 Other specified disorders of nose and nasal sinuses: Secondary | ICD-10-CM | POA: Diagnosis not present

## 2023-10-28 DIAGNOSIS — H6993 Unspecified Eustachian tube disorder, bilateral: Secondary | ICD-10-CM

## 2023-10-28 DIAGNOSIS — J342 Deviated nasal septum: Secondary | ICD-10-CM | POA: Diagnosis not present

## 2023-10-28 NOTE — Progress Notes (Signed)
 Dear Dr. Arlyss, Here is my assessment for our mutual patient, Ellen Vasquez. Thank you for allowing me the opportunity to care for your patient. Please do not hesitate to contact me should you have any other questions. Sincerely, Dr. Eldora Blanch  Otolaryngology Clinic Note Referring provider: Dr. Arlyss HPI:  Ellen Vasquez is a 48 y.o. female kindly referred by Dr. Arlyss for evaluation of Obstructive Sleep Apnea, Nasal Congestion, and Right Ear Hearing Issues   Initial visit (10/2023):  Diagnosed with Sleep Apnea earlier this year, cannot tolerate full mask so using nasal pillow. She does report that she has a harder time with it as a result. Has had chronic nasal congestion on left for a long time - reports she played basketball and likely had nasal trauma. She does report intermittent sinus infections - about one a year with symptoms include discolored drainage, facial pain/pressure --- improved after her tonsillectomy. She has tried flonase, afrin, and PO antihistamine --- afrin helps. Seasonal allergy symptoms are present - never had allergy testing. No sinus surgery before.   She does report that she has had long-standing right ear issues. Saw Dr. Ethyl for it in 2022 who noted possible small attic cholesteatoma. She has been wearing hearing aids since 2008. She has had prior ear infections but denies recent drainage or pain. Right ear worse than left. Does have intermittent bilateral non-pulsatile tinnitus. Patient denies: ear pain, fullness, vertigo, drainage Patient also denies barotrauma, vestibular suppressant use, ototoxic medication use Prior ear surgery: no  ENT Surgery: denies Personal or FHx of bleeding dz or anesthesia difficulty: no  GLP-1: no AP/AC: no  Tobacco: no  PMHx: Chiari Malformation, Migraines, IBS, OSA  Independent Review of Additional Tests or Records:  Ellen Vasquez Referral notes reviewed and uploaded or available in chart in media tab  (08/11/2023): noted OSA on CPAP, first time. L nose blocked and using flonase and claritin. Dx: Nasal congestion, ref to ENT for treatment MRI Brain 03/28/2015 independently interpreted with respect to sinuses: fairly significant left septal deviation, paranasal sinuses generally clear; left mastoid effusion, likely diffusion restriction CBC and CMP 11/29/2022: WBC 13.0, Plt 264; BUN/Cr 6/1 Sleep Study (04/20/2023 reviewed and interpreted: mild OSA at AHI 13.8; O2 nadir: 87%   PMH/Meds/All/SocHx/FamHx/ROS:   Past Medical History:  Diagnosis Date   Anxiety    zoloft  150 mg    Chiari malformation type I (HCC)    MRI [facility] 2013   Depression    PPD with 2nd and 3rd baby (zoloft )   Endometriosis 1997   Headache    no meds    Headache in pregnancy    Hearing loss    Kidney infection    Kidney stones    current pregnancy    Otosclerosis 2008   Otosclerosis      Past Surgical History:  Procedure Laterality Date   DILATION AND CURETTAGE OF UTERUS  May 2015   LAPAROSCOPIC ABDOMINAL EXPLORATION  06/2013   has had 4 surgeries   SHOULDER SURGERY  2000   TONSILLECTOMY AND ADENOIDECTOMY  2010   TUBAL LIGATION N/A 10/05/2017   Procedure: POST PARTUM TUBAL LIGATION;  Surgeon: Curlene Agent, MD;  Location: [facility] BIRTHING SUITES;  Service: Gynecology;  Laterality: N/A;   WISDOM TOOTH EXTRACTION  2008    Family History  Problem Relation Age of Onset   Asthma Father    Arthritis Father        Rheumatoid   Cancer Maternal Grandmother  skin   Hypertension Other    Hyperlipidemia Other    Stroke Other    Obesity Other    Aneurysm Paternal Grandfather        brain   Skin cancer Paternal Grandmother    Migraines Neg Hx      Social Connections: Socially Integrated (12/01/2022)   Received from [facility]   Social Network    How would you rate your social network (family, work, friends)?: Good participation with social networks      Current Outpatient Medications:    celecoxib   (CELEBREX ) 100 MG capsule, TAKE 1 CAPSULE BY MOUTH TWICE A DAY, Disp: 60 capsule, Rfl: 0   ondansetron  (ZOFRAN -ODT) 8 MG disintegrating tablet, Take 1 tablet (8 mg total) by mouth every 8 (eight) hours as needed for nausea or vomiting., Disp: 12 tablet, Rfl: 0   sertraline  (ZOLOFT ) 100 MG tablet, Take by mouth., Disp: , Rfl:    budesonide  (ENTOCORT EC ) 3 MG 24 hr capsule, Take 3 capsules by mouth every morning. (Patient not taking: Reported on 10/28/2023), Disp: , Rfl:    nystatin  (MYCOSTATIN ) 100000 UNIT/ML suspension, Take 5 mLs by mouth 4 (four) times daily. (Patient not taking: Reported on 10/28/2023), Disp: , Rfl: 0   oxyCODONE -acetaminophen  (PERCOCET/ROXICET) 5-325 MG tablet, Take 1 tablet by mouth every 6 (six) hours as needed for severe pain. (Patient not taking: Reported on 10/28/2023), Disp: 12 tablet, Rfl: 0   PRENATAL 27-1 MG TABS, Take 1 tablet by mouth daily. (Patient not taking: Reported on 10/28/2023), Disp: , Rfl:    promethazine  (PHENERGAN ) 25 MG tablet, Take 25 mg by mouth every 6 (six) hours as needed for nausea or vomiting. (Patient not taking: Reported on 10/28/2023), Disp: , Rfl:  No current facility-administered medications for this visit.  Facility-Administered Medications Ordered in Other Visits:    gadopentetate dimeglumine  (MAGNEVIST ) injection 15 mL, 15 mL, Intravenous, Once PRN, Dohmeier, Dedra, MD   Physical Exam:   BP 108/78 (BP Location: Left Arm, Patient Position: Sitting, Cuff Size: Large)   Pulse 77   Ht 5' 9 (1.753 m)   Wt 175 lb (79.4 kg)   LMP 09/05/2016 (Exact Date)   SpO2 97%   BMI 25.84 kg/m   Salient findings:  CN II-XII intact Bilateral EAC clear and TM intact with bilateral pars flaccida retraction with Type 3 incudomyringostapdiopexy left and right; the right retraction is significantly worse but able to appreciate any obvious epithelial debris Weber 512: right Rinne 512: AC > BC b/l  Anterior rhinoscopy: Septum deviates left fairly  significantly; bilateral inferior turbinates with R > L hypertrophy; Nasal endoscopy was indicated to better evaluate the nose and paranasal sinuses, given the patient's history and exam findings, and is detailed below. No lesions of oral cavity/oropharynx No obviously palpable neck masses/lymphadenopathy/thyromegaly No respiratory distress or stridor  Seprately Identifiable Procedures:  Prior to initiating any procedures, risks/benefits/alternatives were explained to the patient and verbal consent obtained. PROCEDURE: Bilateral Diagnostic Rigid Nasal Endoscopy Pre-procedure diagnosis: Concern for nasal congestion, nasal obstruction Post-procedure diagnosis: same Indication: See pre-procedure diagnosis and physical exam above Complications: None apparent EBL: 0 mL Anesthesia: Lidocaine  4% and topical decongestant was topically sprayed in each nasal cavity  Description of Procedure:  Patient was identified. A rigid 30 degree endoscope was utilized to evaluate the sinonasal cavities, mucosa, sinus ostia and turbinates and septum.  Overall, signs of mucosal inflammation are not noted.  Also noted are significant left septal deviation.  No mucopurulence, polyps, or masses noted.  Right Middle meatus: clear Right SE Recess: clear Left MM: clear Left SE Recess: clear  CPT CODE -- 68768 - Mod 25   Electronically signed by: Eldora KATHEE Blanch, MD 10/28/2023 12:46 PM   Impression & Plans:  Ellen Vasquez is a 48 y.o. female with:  1. Nasal congestion   2. Hypertrophy of both inferior nasal turbinates   3. Nasal septal deviation   4. OSA (obstructive sleep apnea)   5. Nasal obstruction   6. Conductive hearing loss, bilateral   7. Tympanic membrane retraction, bilateral   8. Dysfunction of both eustachian tubes    Multiple issues today: Nasal Symptoms/OSA - significant septal deviation and bothering her during day and using CPAP. She has tried prior nasal medications but no significant  benefit except afrin. Does have significant septal deviation - we discussed open v/s endoscopic septo. She opted for endoscopic as she does not want open repair.   We discussed the goals of septoplasty and turbinate reduction, and expectations for postoperative management. Will plan to leave splints in place, and removal was also discussed. We also discussed nasal obstruction post-operatively until splints in place and pain management.  We discussed R/B/A including pain, infection, bleeding (~2% risk of operative visit for control), persistent symptoms, need for revision surgery, and other risks including damage to surrounding structures, septal perforation, anesthetic complications, among others.  We discussed use of nasal saline spray and nasal saline irrigations post-operatively We also discussed use of intranasal steroid post-operatively until healing occurs Patient understands and is ready to proceed with this  2. Hearing loss and TM retraction - no obvious cholesteatoma today but ETD and severe retraction. No recent HT but does wear HA and will ned updated audio.  - Start flonase BID, autoinsufflate ears  F/u POD 5 with audio  See below regarding exact medications prescribed this encounter including dosages and route: No orders of the defined types were placed in this encounter.     Thank you for allowing me the opportunity to care for your patient. Please do not hesitate to contact me should you have any other questions.  Sincerely, Eldora Blanch, MD Otolaryngologist (ENT), [facility] Phone: [phone] Fax: [phone]  10/28/2023, 12:46 PM   MDM:  Level 4 - 99204 Complexity/Problems addressed: mod - chronic problems, worsening Data complexity: mod - independent review of notes, labs; independent imaging interpretation - Morbidity: mod - decision for surgery

## 2023-11-08 DIAGNOSIS — M064 Inflammatory polyarthropathy: Secondary | ICD-10-CM | POA: Diagnosis not present

## 2023-11-08 DIAGNOSIS — R5383 Other fatigue: Secondary | ICD-10-CM | POA: Diagnosis not present

## 2023-11-08 DIAGNOSIS — M79641 Pain in right hand: Secondary | ICD-10-CM | POA: Diagnosis not present

## 2023-11-08 DIAGNOSIS — M1991 Primary osteoarthritis, unspecified site: Secondary | ICD-10-CM | POA: Diagnosis not present

## 2023-11-08 DIAGNOSIS — I7300 Raynaud's syndrome without gangrene: Secondary | ICD-10-CM | POA: Diagnosis not present

## 2023-11-08 DIAGNOSIS — R768 Other specified abnormal immunological findings in serum: Secondary | ICD-10-CM | POA: Diagnosis not present

## 2023-11-14 DIAGNOSIS — G4733 Obstructive sleep apnea (adult) (pediatric): Secondary | ICD-10-CM | POA: Diagnosis not present

## 2023-12-14 DIAGNOSIS — G4733 Obstructive sleep apnea (adult) (pediatric): Secondary | ICD-10-CM | POA: Diagnosis not present

## 2023-12-20 ENCOUNTER — Encounter: Payer: Self-pay | Admitting: Radiology

## 2023-12-21 DIAGNOSIS — L700 Acne vulgaris: Secondary | ICD-10-CM | POA: Diagnosis not present

## 2023-12-21 DIAGNOSIS — L732 Hidradenitis suppurativa: Secondary | ICD-10-CM | POA: Diagnosis not present

## 2024-01-10 DIAGNOSIS — M1991 Primary osteoarthritis, unspecified site: Secondary | ICD-10-CM | POA: Diagnosis not present

## 2024-01-10 DIAGNOSIS — L732 Hidradenitis suppurativa: Secondary | ICD-10-CM | POA: Diagnosis not present

## 2024-01-10 DIAGNOSIS — M064 Inflammatory polyarthropathy: Secondary | ICD-10-CM | POA: Diagnosis not present

## 2024-01-10 DIAGNOSIS — I7300 Raynaud's syndrome without gangrene: Secondary | ICD-10-CM | POA: Diagnosis not present

## 2024-02-13 DIAGNOSIS — G4733 Obstructive sleep apnea (adult) (pediatric): Secondary | ICD-10-CM | POA: Diagnosis not present

## 2024-03-01 ENCOUNTER — Encounter (INDEPENDENT_AMBULATORY_CARE_PROVIDER_SITE_OTHER): Admitting: Otolaryngology

## 2024-03-01 ENCOUNTER — Ambulatory Visit (INDEPENDENT_AMBULATORY_CARE_PROVIDER_SITE_OTHER): Admitting: Audiology

## 2024-07-12 ENCOUNTER — Encounter (INDEPENDENT_AMBULATORY_CARE_PROVIDER_SITE_OTHER): Admitting: Otolaryngology

## 2024-07-12 ENCOUNTER — Ambulatory Visit (INDEPENDENT_AMBULATORY_CARE_PROVIDER_SITE_OTHER): Admitting: Audiology
